# Patient Record
Sex: Female | Born: 1970 | Race: Black or African American | Hispanic: No | Marital: Married | Smoking: Never smoker
Health system: Southern US, Community
[De-identification: ages and names within clinical notes are randomized; demographics above are authoritative.]

## PROBLEM LIST (undated history)

## (undated) DIAGNOSIS — R51 Headache: Secondary | ICD-10-CM

## (undated) DIAGNOSIS — Z8711 Personal history of peptic ulcer disease: Secondary | ICD-10-CM

## (undated) DIAGNOSIS — H409 Unspecified glaucoma: Secondary | ICD-10-CM

## (undated) DIAGNOSIS — E282 Polycystic ovarian syndrome: Secondary | ICD-10-CM

## (undated) DIAGNOSIS — M1990 Unspecified osteoarthritis, unspecified site: Secondary | ICD-10-CM

## (undated) DIAGNOSIS — R7303 Prediabetes: Secondary | ICD-10-CM

## (undated) DIAGNOSIS — K5900 Constipation, unspecified: Secondary | ICD-10-CM

## (undated) DIAGNOSIS — R55 Syncope and collapse: Secondary | ICD-10-CM

## (undated) DIAGNOSIS — E611 Iron deficiency: Secondary | ICD-10-CM

## (undated) DIAGNOSIS — D649 Anemia, unspecified: Secondary | ICD-10-CM

## (undated) DIAGNOSIS — K219 Gastro-esophageal reflux disease without esophagitis: Secondary | ICD-10-CM

## (undated) DIAGNOSIS — I10 Essential (primary) hypertension: Secondary | ICD-10-CM

## (undated) DIAGNOSIS — G4700 Insomnia, unspecified: Secondary | ICD-10-CM

## (undated) DIAGNOSIS — R42 Dizziness and giddiness: Secondary | ICD-10-CM

## (undated) DIAGNOSIS — R519 Headache, unspecified: Secondary | ICD-10-CM

## (undated) DIAGNOSIS — K922 Gastrointestinal hemorrhage, unspecified: Secondary | ICD-10-CM

## (undated) DIAGNOSIS — E039 Hypothyroidism, unspecified: Secondary | ICD-10-CM

## (undated) DIAGNOSIS — E785 Hyperlipidemia, unspecified: Secondary | ICD-10-CM

## (undated) DIAGNOSIS — Z8719 Personal history of other diseases of the digestive system: Secondary | ICD-10-CM

## (undated) DIAGNOSIS — M19041 Primary osteoarthritis, right hand: Secondary | ICD-10-CM

## (undated) DIAGNOSIS — E559 Vitamin D deficiency, unspecified: Secondary | ICD-10-CM

## (undated) DIAGNOSIS — M19042 Primary osteoarthritis, left hand: Secondary | ICD-10-CM

## (undated) DIAGNOSIS — N926 Irregular menstruation, unspecified: Secondary | ICD-10-CM

## (undated) HISTORY — DX: Essential (primary) hypertension: I10

## (undated) HISTORY — DX: Syncope and collapse: R55

## (undated) HISTORY — DX: Iron deficiency: E61.1

## (undated) HISTORY — DX: Personal history of peptic ulcer disease: Z87.11

## (undated) HISTORY — DX: Primary osteoarthritis, right hand: M19.042

## (undated) HISTORY — DX: Dizziness and giddiness: R42

## (undated) HISTORY — DX: Irregular menstruation, unspecified: N92.6

## (undated) HISTORY — DX: Primary osteoarthritis, right hand: M19.041

## (undated) HISTORY — DX: Gastrointestinal hemorrhage, unspecified: K92.2

## (undated) HISTORY — DX: Insomnia, unspecified: G47.00

## (undated) HISTORY — DX: Polycystic ovarian syndrome: E28.2

## (undated) HISTORY — DX: Unspecified glaucoma: H40.9

## (undated) HISTORY — DX: Vitamin D deficiency, unspecified: E55.9

## (undated) HISTORY — DX: Hypothyroidism, unspecified: E03.9

## (undated) HISTORY — DX: Hyperlipidemia, unspecified: E78.5

## (undated) HISTORY — DX: Constipation, unspecified: K59.00

---
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
## 2005-10-10 ENCOUNTER — Inpatient Hospital Stay (HOSPITAL_COMMUNITY): Admission: AD | Admit: 2005-10-10 | Discharge: 2005-10-17 | Payer: Self-pay | Admitting: Obstetrics and Gynecology

## 2005-10-11 ENCOUNTER — Ambulatory Visit: Payer: Self-pay | Admitting: Neonatology

## 2005-10-15 ENCOUNTER — Encounter (INDEPENDENT_AMBULATORY_CARE_PROVIDER_SITE_OTHER): Payer: Self-pay | Admitting: Specialist

## 2005-10-18 ENCOUNTER — Encounter: Admission: RE | Admit: 2005-10-18 | Discharge: 2005-11-16 | Payer: Self-pay | Admitting: Obstetrics and Gynecology

## 2005-11-04 ENCOUNTER — Inpatient Hospital Stay (HOSPITAL_COMMUNITY): Admission: AD | Admit: 2005-11-04 | Discharge: 2005-11-04 | Payer: Self-pay | Admitting: Obstetrics and Gynecology

## 2005-11-17 ENCOUNTER — Encounter: Admission: RE | Admit: 2005-11-17 | Discharge: 2005-12-17 | Payer: Self-pay | Admitting: Obstetrics and Gynecology

## 2005-12-18 ENCOUNTER — Encounter: Admission: RE | Admit: 2005-12-18 | Discharge: 2006-01-08 | Payer: Self-pay | Admitting: Obstetrics and Gynecology

## 2006-05-09 ENCOUNTER — Other Ambulatory Visit: Admission: RE | Admit: 2006-05-09 | Discharge: 2006-05-09 | Payer: Self-pay | Admitting: Obstetrics and Gynecology

## 2008-11-25 HISTORY — PX: LAPAROSCOPIC GASTRIC BANDING: SHX1100

## 2009-02-10 ENCOUNTER — Encounter: Admission: RE | Admit: 2009-02-10 | Discharge: 2009-02-10 | Payer: Self-pay | Admitting: Internal Medicine

## 2010-12-16 ENCOUNTER — Emergency Department (HOSPITAL_COMMUNITY)
Admission: EM | Admit: 2010-12-16 | Discharge: 2010-12-16 | Payer: Self-pay | Source: Home / Self Care | Admitting: Emergency Medicine

## 2011-04-12 NOTE — Discharge Summary (Signed)
Susan [name], KURKA             ACCOUNT NO.:  [id number]   MEDICAL RECORD NO.:  [id number]          PATIENT TYPE:  INP   LOCATION:  9128                          [facility]ILITY:  [facility]   PHYSICIAN:  Hal Morales, M.D.DATE OF BIRTH:  10-Jul-1971   DATE OF ADMISSION:  10/10/2005  DATE OF DISCHARGE:  10/17/2005                                 DISCHARGE SUMMARY   ADMISSION DIAGNOSES:  1.  Intrauterine pregnancy at 33 weeks.  2.  Mild preeclampsia.   DISCHARGE DIAGNOSES:  1.  Intrauterine pregnancy at 33 weeks.  2.  Mild preeclampsia.  3.  Status post preterm vaginal delivery at 34 weeks with precipitous      delivery of a viable female infant named Noelle, Apgars 9 and 9,      weighing 4 pounds 13 ounces.   HOSPITAL PROCEDURES:  1.  Magnesium sulfate infusion.  2.  Electronic fetal monitoring.  3.  Cervidil induction of labor.  4.  Spontaneous vaginal delivery, precipitous.   HOSPITAL COURSE:  The patient was admitted with mild preeclampsia at 33  weeks.  She was given betamethasone for lung maturity and started on  magnesium sulfate infusion.  She did well.  Blood pressures stabilized and  she remained on the magnesium and got her second dose of betamethasone.  She  did develop some intermittent variable decelerations, which recovered during  the antepartum stay.  On 10/14/05, an attempt was made for amniocentesis for  fetal lung maturity, but this was unable to be completed because of cord in  the fluid pocket, so the patient was offered observation versus induction of  labor and the patient chose to be induced.  The cervix was unfavorable, so  Cervidil was placed.  She proceeded to labor on the Cervidil.  After it was  removed, she then labored over a two hour period and delivered precipitously  a female infant named Noelle, Apgars 9 and 9, weighing 4 pounds 13 ounces.  The infant cried spontaneously and was taken to the nursery.  There were no  lacerations or complications.   On postoperative day #1, the patient was  doing well.  Blood pressures were 123-143/75-92.  Routine postpartum care  was given.  She began to diurese and had a couple of blood pressures with  systolics in the 90s, but generally continued to improve.  On postoperative  day #2, blood pressures were 1245-141/74-88.  Her weight was 199.4, which  was down from 211. Chest was clear.  Heart rate regular rate and rhythm.  There was trace edema in the extremities.  Fundus was firm and lochia small.  The patient was deemed to receive the full benefit of her hospital stay and  was discharged home with the baby, who was doing well and gaining weight.   DISCHARGE MEDICATIONS:  Motrin 600 mg p.o. q.6h. p.r.n.   DISCHARGE LABORATORY:  White blood cell count 11.2, hemoglobin 12.0,  platelets 327.   DISCHARGE INSTRUCTIONS:  Per [facility] hand out.   DISCHARGE FOLLOWUP:  In six weeks or p.r.n. with observation for symptoms of  PIH.  Marie L. Williams, C.N.M.      Hal Morales, M.D.  Electronically Signed    MLW/MEDQ  D:  10/17/2005  T:  10/17/2005  Job:  [id number]

## 2011-04-12 NOTE — H&P (Signed)
Susan, [name]             ACCOUNT NO.:  [id number]   MEDICAL RECORD NO.:  [id number]          PATIENT TYPE:  INP   LOCATION:  9176                          [facility]   PHYSICIAN:  Naima A. Dillard, M.D. DATE OF BIRTH:  17-Aug-1971   DATE OF ADMISSION:  10/10/2005  DATE OF DISCHARGE:                                HISTORY & PHYSICAL   HISTORY OF PRESENT ILLNESS:  [name] is a 40 year old gravida 5, para 1-  0-3-1, who is admitted to the birth suites at 33 weeks and 5 days gestation  for magnesium prophylaxis secondary to mild preeclampsia.  The patient has  been being followed as an outpatient with some borderline elevated blood  pressures.  A 24-hour urine obtained and completed on October 10, 2005 with  the result of 338 mg of protein in a 24-hour period of time.  The patient's  blood pressure immediately prior to admission at the office was 120/80.  The  patient denies any headaches, vision changes, right upper quadrant pain.  The patient denies any uterine contractions, leakage of fluid, or bleeding.  The patient has been on bed rest at home; however, she has had limited  compliance secondary to child care responsibilities.  Previous 24-hour urine  prior to the one obtained the day of admission revealed 237 mg of protein in  a 24-hour period of time.  Pregnancy is remarkable for closely-spaced  births, history of PIH and mild preeclampsia previous pregnancy, transfer to  [facility] at 30 weeks, and history of migraines.   PRENATAL LABORATORIES:  Initial hemoglobin and hematocrit of 13.1 and 38.9,  platelets 330,00, blood type O positive, antibody screen negative, sickle  cell trait negative, RPR nonreactive, rubella titer immune, hepatitis B  surface antigen negative, HIV negative, gonorrhea and Chlamydia negative,  cystic fibrosis negative.  TSH 1.334.  Quad screen negative.  Glucola 143,  and 28-week hemoglobin of 12.1.  The patient with 3-hour Glucola result that  is unavailable at the present time; however, the patient reports the results  as normal, as the patient had them done by a previous provide in [place],  [place].   HISTORY OF PRESENT PREGNANCY:  The patient entered care with [facility] at  approximately [redacted] weeks gestation.  The patient had been obtaining regular  prenatal care since the first trimester of her pregnancy.  The patient with  mildly elevated blood pressure noted at 31 weeks, as well as 32 weeks.  The  patient began collecting the first 24-hour urine, which was returned with a  result of 237 mg.  The patient denies any toxic symptoms throughout.  The  patient was seen on October 10, 2005, at which time deep tendon reflexes  were 1+.  The patient denies any toxic symptoms; however, a 24-hour urine is  pending.  After result of the 24-hour urine are made available, the patient  is to be admitted to [facility].  The patient has been  followed by the C.N.M. service at [facility] until admission, at which time she  was transfer to the M.D. care.  The patient's last  menstrual period was  questionable, and EDC of November 24, 2005 is established by a 5-week  ultrasound on Mar 29, 2005.   OBSTETRIC HISTORY:  1.  1996 - TAB.  2.  1997 - TAB.  3.  1999 - Miscarriage, SAB.  4.  January 2006 - spontaneous vaginal delivery of a viable female infant,      weight 5.1 pounds, at [redacted] weeks gestation, epidural anesthesia, in      [place], [place], without complications.  The patient reports      increased blood pressure at approximately 34 weeks prior to admission.  5.  Present pregnancy.   GYNECOLOGIC HISTORY:  The patient with 35-45 day cycles.  However, the  patient had not had a period since she had been breastfeeding.  The patient  with a history of abnormal Pap smear in 1999.  Status post colpo with normal  Paps since that time.  The patient denies any history of STDs.   PAST MEDICAL HISTORY:  1  Migraine headaches.  1.   Hypertension only during pregnancy.  2.  Rare UTIs.   PAST SURGICAL HISTORY:  1.  Liposuction in 2001.  2.  Wisdom teeth in 1991.   FAMILY HISTORY:  Paternal grandfather, maternal grandmother with MIs.  Mother, father, and brother with hypertension.  Mother with diabetes.  Maternal grandfather with diabetes.  Paternal grandmother with diabetes.  Maternal grandmother with renal cancer.  Paternal grandmother with ovarian  cancer.   CURRENT MEDICATIONS:  Prenatal vitamins only.   ALLERGIES:  No known drug allergies.   GENETIC HISTORY:  Daughter with sickle cell trait; however, the patient is  trait negative.  Otherwise, negative genetic screen.   SOCIAL HISTORY:  The patient is a Financial trader.  She and her husband  have recently moved to the area.  The patient denies any domestic violence.  The patient denies any use of alcohol tobacco or street drugs.  The patient  is married to the father of the baby, who is involved and supportive.  The  patient and her husband subscribe to the [facility] faith.   PHYSICAL EXAMINATION:  VITAL SIGNS:  The patient is afebrile.  Vital signs  are stable.  HEENT:  Within normal limits.  LUNGS:  Clear.  HEART:  Regular rate and rhythm without murmur.  BREASTS:  Soft.  ABDOMEN:  Soft, gravid in contour, and nontender.  Fundal height consistent  with [redacted] weeks gestation.  Fetus noted to be vertex by Leopold's maneuvers.  Fetal heart rate in the 130s and reactive.  No contractions noted on uterine  monitoring.  PELVIC:  Deferred at the moment.  EXTREMITIES:  There were 1+ deep tendon reflexes.  No clonus, and trace  edema noted to be present.   LABORATORY WORK:  Admission PIH laboratory work all within normal limits.   ASSESSMENT:  1.  Mild preeclampsia at 33 plus weeks.  2.  History of migraines.   PLAN:  1.  The patient will be admitted to the birthing suites, as no antepartum     beds are available.  This is per consult with Dr. Normand Sloop.   The patient      will be started on magnesium sulfate for seizure prophylaxis.  The      patient to be      given a course of betamethasone for fetal lung maturity.  The patient to      receive a NICU consult.  Further consideration as to delivery after  betamethasone is complete.  The patient was educated and her questions      were answered.      Rhona Leavens, CNM      Naima A. Normand Sloop, M.D.  Electronically Signed    NOS/MEDQ  D:  10/11/2005  T:  10/11/2005  Job:  [id number]

## 2012-02-10 ENCOUNTER — Other Ambulatory Visit: Payer: Self-pay | Admitting: Internal Medicine

## 2012-02-10 DIAGNOSIS — Z1231 Encounter for screening mammogram for malignant neoplasm of breast: Secondary | ICD-10-CM

## 2012-08-07 ENCOUNTER — Ambulatory Visit (INDEPENDENT_AMBULATORY_CARE_PROVIDER_SITE_OTHER): Payer: 59 | Admitting: Surgery

## 2012-08-07 ENCOUNTER — Encounter (INDEPENDENT_AMBULATORY_CARE_PROVIDER_SITE_OTHER): Payer: Self-pay | Admitting: Surgery

## 2012-08-07 VITALS — BP 110/74 | HR 64 | Resp 12 | Ht 62.0 in | Wt 137.4 lb

## 2012-08-07 DIAGNOSIS — Z9884 Bariatric surgery status: Secondary | ICD-10-CM | POA: Insufficient documentation

## 2012-08-07 NOTE — Patient Instructions (Signed)

## 2012-08-07 NOTE — Progress Notes (Signed)
Dejanique Buonomo Body mass index is 25.13 kg/(m^2).  Having regurgitation:  Occasional with eating too fast  Nocturnal reflux?  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 At that time and he had a little bit to her band. Since that time she is kept her weight run around 1:30 to 137. She looks good. In questioning her I think she is appropriately adjusted. I'm afraid adding more fluid will cause her to maladaptive eating. I suggested that she get into an exercise regimen and work out more rather than trying to had a tighter band. I made her appointment to see [place] back in one year to consider her for a fillet that time if needed. Overall she's lost 60 pounds I think looks good and has done well.

## 2013-03-08 ENCOUNTER — Encounter (HOSPITAL_COMMUNITY): Payer: Self-pay | Admitting: Pharmacy Technician

## 2013-03-11 ENCOUNTER — Other Ambulatory Visit: Payer: Self-pay | Admitting: Obstetrics and Gynecology

## 2013-03-15 ENCOUNTER — Other Ambulatory Visit (HOSPITAL_COMMUNITY): Payer: Self-pay | Admitting: Obstetrics and Gynecology

## 2013-03-15 ENCOUNTER — Ambulatory Visit (HOSPITAL_COMMUNITY)
Admission: RE | Admit: 2013-03-15 | Discharge: 2013-03-15 | Disposition: A | Discharge status: Home / Self Care | Payer: PRIVATE HEALTH INSURANCE | Source: Ambulatory Visit | Attending: Obstetrics and Gynecology | Admitting: Obstetrics and Gynecology

## 2013-03-15 ENCOUNTER — Encounter (HOSPITAL_COMMUNITY): Payer: Self-pay

## 2013-03-15 DIAGNOSIS — Z1231 Encounter for screening mammogram for malignant neoplasm of breast: Secondary | ICD-10-CM

## 2013-03-15 DIAGNOSIS — Z01812 Encounter for preprocedural laboratory examination: Secondary | ICD-10-CM | POA: Insufficient documentation

## 2013-03-15 DIAGNOSIS — Z01818 Encounter for other preprocedural examination: Secondary | ICD-10-CM | POA: Insufficient documentation

## 2013-03-15 LAB — SURGICAL PCR SCREEN
MRSA, PCR: NEGATIVE
Staphylococcus aureus: NEGATIVE

## 2013-03-15 LAB — BASIC METABOLIC PANEL
BUN: 10 mg/dL (ref 6–23)
CO2: 31 mEq/L (ref 19–32)
Calcium: 9.4 mg/dL (ref 8.4–10.5)
Chloride: 100 mEq/L (ref 96–112)
GFR calc Af Amer: >90 mL/min (ref >90)
GFR calc non Af Amer: 88 mL/min — ABNORMAL LOW (ref >90)
Glucose, Bld: 91 mg/dL (ref 70–99)
Potassium: 3.8 mEq/L (ref 3.5–5.1)
Sodium: 137 mEq/L (ref 135–145)

## 2013-03-15 LAB — CBC
HCT: 38.4 % (ref 36.0–46.0)
Hemoglobin: 12.7 g/dL (ref 12.0–15.0)
MCHC: 33.1 g/dL (ref 30.0–36.0)
MCV: 93.4 fL (ref 78.0–100.0)
Platelets: 275 10*3/uL (ref 150–400)
RBC: 4.11 MIL/uL (ref 3.87–5.11)
RDW: 12.8 % (ref 11.5–15.5)
WBC: 5.2 10*3/uL (ref 4.0–10.5)

## 2013-03-15 NOTE — Patient Instructions (Addendum)
[address]  03/15/2013   Your procedure is scheduled on:  03/22/13  Enter through the Main Entrance of [facility] at 1230 AM.  Pick up the phone at the desk and dial [phone].   Call this number if you have problems the morning of surgery: [phone]   Remember:   Do not eat food:After Midnight.  Do not drink clear liquids: 6 Hours before arrival.  Take these medicines the morning of surgery with A SIP OF WATER: HCTZ   Do not wear jewelry, make-up or nail polish.  Do not wear lotions, powders, or perfumes. You may wear deodorant.  Do not shave 48 hours prior to surgery.  Do not bring valuables to the hospital.  Contacts, dentures or bridgework may not be worn into surgery.  Leave suitcase in the car. After surgery it may be brought to your room.  For patients admitted to the hospital, checkout time is 11:00 AM the day of discharge.   Patients discharged the day of surgery will not be allowed to drive home.  Name and phone number of your driver: NA  Special Instructions: Shower using CHG 2 nights before surgery and the night before surgery.  If you shower the day of surgery use CHG.  Use special wash - you have one bottle of CHG for all showers.  You should use approximately 1/3 of the bottle for each shower.   Please read over the following fact sheets that you were given: MRSA Information

## 2013-03-17 ENCOUNTER — Encounter (HOSPITAL_COMMUNITY): Payer: Self-pay | Admitting: Pharmacy Technician

## 2013-03-22 ENCOUNTER — Encounter (HOSPITAL_COMMUNITY)
Admission: RE | Disposition: A | Discharge status: Home / Self Care | Payer: Self-pay | Source: Ambulatory Visit | Attending: Obstetrics and Gynecology

## 2013-03-22 ENCOUNTER — Ambulatory Visit (HOSPITAL_COMMUNITY): Payer: PRIVATE HEALTH INSURANCE | Admitting: Anesthesiology

## 2013-03-22 ENCOUNTER — Ambulatory Visit (HOSPITAL_COMMUNITY)
Admission: RE | Admit: 2013-03-22 | Discharge: 2013-03-22 | Disposition: A | Discharge status: Home / Self Care | Payer: PRIVATE HEALTH INSURANCE | Source: Ambulatory Visit | Attending: Obstetrics and Gynecology | Admitting: Obstetrics and Gynecology

## 2013-03-22 ENCOUNTER — Encounter (HOSPITAL_COMMUNITY): Payer: Self-pay | Admitting: Anesthesiology

## 2013-03-22 DIAGNOSIS — Z1231 Encounter for screening mammogram for malignant neoplasm of breast: Secondary | ICD-10-CM | POA: Insufficient documentation

## 2013-03-22 DIAGNOSIS — R1031 Right lower quadrant pain: Secondary | ICD-10-CM | POA: Insufficient documentation

## 2013-03-22 DIAGNOSIS — IMO0002 Reserved for concepts with insufficient information to code with codable children: Secondary | ICD-10-CM | POA: Insufficient documentation

## 2013-03-22 HISTORY — PX: SALPINGECTOMY: SHX328

## 2013-03-22 HISTORY — PX: LAPAROSCOPY: SHX197

## 2013-03-22 HISTORY — PX: BILATERAL SALPINGECTOMY: SHX5743

## 2013-03-22 SURGERY — LAPAROSCOPY OPERATIVE
Anesthesia: General | Wound class: Clean Contaminated

## 2013-03-22 MED ORDER — LIDOCAINE HCL (CARDIAC) 20 MG/ML IV SOLN
INTRAVENOUS | Status: DC | PRN
  Administered 2013-03-22: 80 mg via INTRAVENOUS

## 2013-03-22 MED ORDER — FENTANYL CITRATE 0.05 MG/ML IJ SOLN
INTRAMUSCULAR | Status: AC
  Administered 2013-03-22: 50 ug via INTRAVENOUS
  Filled 2013-03-22: qty 2

## 2013-03-22 MED ORDER — KETOROLAC TROMETHAMINE 30 MG/ML IJ SOLN: 15.0000 mg | Freq: Once | INTRAMUSCULAR | Status: DC | PRN

## 2013-03-22 MED ORDER — GLYCOPYRROLATE 0.2 MG/ML IJ SOLN
INTRAMUSCULAR | Status: AC
  Filled 2013-03-22: qty 3

## 2013-03-22 MED ORDER — ONDANSETRON HCL 4 MG/2ML IJ SOLN
INTRAMUSCULAR | Status: DC | PRN
  Administered 2013-03-22: 4 mg via INTRAVENOUS

## 2013-03-22 MED ORDER — OXYCODONE-ACETAMINOPHEN 5-325 MG PO TABS: 1.0000 | ORAL_TABLET | ORAL | Status: DC | PRN

## 2013-03-22 MED ORDER — MIDAZOLAM HCL 5 MG/5ML IJ SOLN
INTRAMUSCULAR | Status: DC | PRN
  Administered 2013-03-22: 2 mg via INTRAVENOUS

## 2013-03-22 MED ORDER — LACTATED RINGERS IV SOLN
INTRAVENOUS | Status: DC
  Administered 2013-03-22 (×2): via INTRAVENOUS

## 2013-03-22 MED ORDER — DEXAMETHASONE SODIUM PHOSPHATE 10 MG/ML IJ SOLN
INTRAMUSCULAR | Status: AC
  Filled 2013-03-22: qty 1

## 2013-03-22 MED ORDER — MIDAZOLAM HCL 2 MG/2ML IJ SOLN
INTRAMUSCULAR | Status: AC
  Filled 2013-03-22: qty 2

## 2013-03-22 MED ORDER — FENTANYL CITRATE 0.05 MG/ML IJ SOLN
INTRAMUSCULAR | Status: AC
  Filled 2013-03-22: qty 5

## 2013-03-22 MED ORDER — DEXAMETHASONE SODIUM PHOSPHATE 4 MG/ML IJ SOLN
INTRAMUSCULAR | Status: DC | PRN
  Administered 2013-03-22: 10 mg via INTRAVENOUS

## 2013-03-22 MED ORDER — KETOROLAC TROMETHAMINE 30 MG/ML IJ SOLN
INTRAMUSCULAR | Status: AC
  Filled 2013-03-22: qty 1

## 2013-03-22 MED ORDER — ROCURONIUM BROMIDE 50 MG/5ML IV SOLN
INTRAVENOUS | Status: AC
  Filled 2013-03-22: qty 1

## 2013-03-22 MED ORDER — PROPOFOL 10 MG/ML IV BOLUS
INTRAVENOUS | Status: DC | PRN
  Administered 2013-03-22: 160 mg via INTRAVENOUS

## 2013-03-22 MED ORDER — FENTANYL CITRATE 0.05 MG/ML IJ SOLN
INTRAMUSCULAR | Status: DC | PRN
  Administered 2013-03-22: 50 ug via INTRAVENOUS
  Administered 2013-03-22: 100 ug via INTRAVENOUS

## 2013-03-22 MED ORDER — BUPIVACAINE HCL (PF) 0.25 % IJ SOLN
INTRAMUSCULAR | Status: AC
  Filled 2013-03-22: qty 30

## 2013-03-22 MED ORDER — BUPIVACAINE HCL (PF) 0.25 % IJ SOLN
INTRAMUSCULAR | Status: DC | PRN
  Administered 2013-03-22: 6 mL

## 2013-03-22 MED ORDER — GLYCOPYRROLATE 0.2 MG/ML IJ SOLN
INTRAMUSCULAR | Status: AC
  Filled 2013-03-22: qty 1

## 2013-03-22 MED ORDER — KETOROLAC TROMETHAMINE 30 MG/ML IJ SOLN
INTRAMUSCULAR | Status: DC | PRN
  Administered 2013-03-22: 30 mg via INTRAVENOUS

## 2013-03-22 MED ORDER — ONDANSETRON HCL 4 MG/2ML IJ SOLN
INTRAMUSCULAR | Status: AC
  Filled 2013-03-22: qty 2

## 2013-03-22 MED ORDER — NEOSTIGMINE METHYLSULFATE 1 MG/ML IJ SOLN
INTRAMUSCULAR | Status: AC
  Filled 2013-03-22: qty 1

## 2013-03-22 MED ORDER — ACETAMINOPHEN 10 MG/ML IV SOLN
INTRAVENOUS | Status: DC | PRN
  Administered 2013-03-22: 1000 mg via INTRAVENOUS

## 2013-03-22 MED ORDER — ROCURONIUM BROMIDE 100 MG/10ML IV SOLN
INTRAVENOUS | Status: DC | PRN
  Administered 2013-03-22: 50 mg via INTRAVENOUS

## 2013-03-22 MED ORDER — LACTATED RINGERS IR SOLN
Status: DC | PRN
  Administered 2013-03-22: 3000 mL

## 2013-03-22 MED ORDER — LIDOCAINE HCL (CARDIAC) 20 MG/ML IV SOLN
INTRAVENOUS | Status: AC
  Filled 2013-03-22: qty 5

## 2013-03-22 MED ORDER — NEOSTIGMINE METHYLSULFATE 1 MG/ML IJ SOLN
INTRAMUSCULAR | Status: DC | PRN
  Administered 2013-03-22: 4 mg via INTRAVENOUS

## 2013-03-22 MED ORDER — GLYCOPYRROLATE 0.2 MG/ML IJ SOLN
INTRAMUSCULAR | Status: DC | PRN
  Administered 2013-03-22: .6 mg via INTRAVENOUS

## 2013-03-22 MED ORDER — PROPOFOL 10 MG/ML IV EMUL
INTRAVENOUS | Status: AC
  Filled 2013-03-22: qty 20

## 2013-03-22 MED ORDER — ACETAMINOPHEN 160 MG/5ML PO SOLN
ORAL | Status: AC
  Filled 2013-03-22: qty 20.3

## 2013-03-22 MED ORDER — FENTANYL CITRATE 0.05 MG/ML IJ SOLN: 25.0000 ug | INTRAMUSCULAR | Status: DC | PRN

## 2013-03-22 SURGICAL SUPPLY — 72 items
ADH SKN CLS APL DERMABOND .7 (GAUZE/BANDAGES/DRESSINGS) ×2
APPLICATOR COTTON TIP 6IN STRL (MISCELLANEOUS) ×3 IMPLANT
BAG SPEC RTRVL LRG 6X4 10 (ENDOMECHANICALS)
BARRIER ADHS 3X4 INTERCEED (GAUZE/BANDAGES/DRESSINGS) ×3 IMPLANT
BRR ADH 4X3 ABS CNTRL BYND (GAUZE/BANDAGES/DRESSINGS) ×2
CABLE HIGH FREQUENCY MONO STRZ (ELECTRODE) IMPLANT
CATH ROBINSON RED A/P 16FR (CATHETERS) ×3 IMPLANT
CHLORAPREP W/TINT 26ML (MISCELLANEOUS) ×3 IMPLANT
CLOTH BEACON ORANGE TIMEOUT ST (SAFETY) ×3 IMPLANT
CONT PATH 16OZ SNAP LID 3702 (MISCELLANEOUS) ×3 IMPLANT
COVER MAYO STAND STRL (DRAPES) ×3 IMPLANT
COVER TABLE BACK 60X90 (DRAPES) ×6 IMPLANT
COVER TIP SHEARS 8 DVNC (MISCELLANEOUS) ×2 IMPLANT
COVER TIP SHEARS 8MM DA VINCI (MISCELLANEOUS) ×1
DECANTER SPIKE VIAL GLASS SM (MISCELLANEOUS) ×3 IMPLANT
DERMABOND ADVANCED (GAUZE/BANDAGES/DRESSINGS) ×1
DERMABOND ADVANCED .7 DNX12 (GAUZE/BANDAGES/DRESSINGS) ×2 IMPLANT
DRAPE HUG U DISPOSABLE (DRAPE) ×3 IMPLANT
DRAPE LG THREE QUARTER DISP (DRAPES) ×6 IMPLANT
DRAPE WARM FLUID 44X44 (DRAPE) ×3 IMPLANT
ELECT LIGASURE LONG (ELECTRODE) IMPLANT
ELECT REM PT RETURN 9FT ADLT (ELECTROSURGICAL) ×3
ELECTRODE REM PT RTRN 9FT ADLT (ELECTROSURGICAL) ×2 IMPLANT
EVACUATOR SMOKE 8.L (FILTER) ×3 IMPLANT
FORCEPS CUTTING 33CM 5MM (CUTTING FORCEPS) ×1 IMPLANT
FORCEPS CUTTING 45CM 5MM (CUTTING FORCEPS) IMPLANT
GAUZE VASELINE 3X9 (GAUZE/BANDAGES/DRESSINGS) IMPLANT
GLOVE BIO SURGEON STRL SZ 6.5 (GLOVE) ×3 IMPLANT
GLOVE BIOGEL PI IND STRL 7.0 (GLOVE) ×4 IMPLANT
GLOVE BIOGEL PI INDICATOR 7.0 (GLOVE) ×2
GOWN PREVENTION PLUS LG XLONG (DISPOSABLE) ×9 IMPLANT
GOWN STRL REIN XL XLG (GOWN DISPOSABLE) ×18 IMPLANT
KIT ACCESSORY DA VINCI DISP (KITS) ×1
KIT ACCESSORY DVNC DISP (KITS) ×2 IMPLANT
LEGGING LITHOTOMY PAIR STRL (DRAPES) ×3 IMPLANT
NDL INSUFFLATION 14GA 150MM (NEEDLE) IMPLANT
NEEDLE INSUFFLATION 120MM (ENDOMECHANICALS) ×3 IMPLANT
NEEDLE INSUFFLATION 14GA 150MM (NEEDLE) ×3 IMPLANT
NS IRRIG 1000ML POUR BTL (IV SOLUTION) ×9 IMPLANT
OCCLUDER COLPOPNEUMO (BALLOONS) IMPLANT
PACK LAPAROSCOPY BASIN (CUSTOM PROCEDURE TRAY) ×3 IMPLANT
PACK LAVH (CUSTOM PROCEDURE TRAY) ×2 IMPLANT
PAD PREP 24X48 CUFFED NSTRL (MISCELLANEOUS) ×6 IMPLANT
POUCH SPECIMEN RETRIEVAL 10MM (ENDOMECHANICALS) IMPLANT
PROTECTOR NERVE ULNAR (MISCELLANEOUS) ×6 IMPLANT
SCISSORS LAP 5X35 DISP (ENDOMECHANICALS) IMPLANT
SET CYSTO W/LG BORE CLAMP LF (SET/KITS/TRAYS/PACK) IMPLANT
SET IRRIG TUBING LAPAROSCOPIC (IRRIGATION / IRRIGATOR) ×3 IMPLANT
SOLUTION ELECTROLUBE (MISCELLANEOUS) ×3 IMPLANT
SUT VIC AB 0 CT1 27 (SUTURE) ×15
SUT VIC AB 0 CT1 27XBRD ANTBC (SUTURE) ×10 IMPLANT
SUT VICRYL 0 UR6 27IN ABS (SUTURE) ×3 IMPLANT
SUT VICRYL 4-0 PS2 18IN ABS (SUTURE) ×6 IMPLANT
SYR 50ML LL SCALE MARK (SYRINGE) ×3 IMPLANT
SYSTEM CONVERTIBLE TROCAR (TROCAR) IMPLANT
TIP UTERINE 5.1X6CM LAV DISP (MISCELLANEOUS) IMPLANT
TIP UTERINE 6.7X10CM GRN DISP (MISCELLANEOUS) IMPLANT
TIP UTERINE 6.7X6CM WHT DISP (MISCELLANEOUS) IMPLANT
TIP UTERINE 6.7X8CM BLUE DISP (MISCELLANEOUS) IMPLANT
TOWEL OR 17X24 6PK STRL BLUE (TOWEL DISPOSABLE) ×9 IMPLANT
TRAY FOLEY BAG SILVER LF 14FR (CATHETERS) ×3 IMPLANT
TROCAR 12M 150ML BLUNT (TROCAR) IMPLANT
TROCAR BALLN 12MMX100 BLUNT (TROCAR) IMPLANT
TROCAR DISP BLADELESS 8 DVNC (TROCAR) ×2 IMPLANT
TROCAR DISP BLADELESS 8MM (TROCAR) ×1
TROCAR OPTI TIP 12M 100M (ENDOMECHANICALS) ×3 IMPLANT
TROCAR OPTI TIP 5M 100M (ENDOMECHANICALS) ×6 IMPLANT
TROCAR XCEL DIL TIP R 11M (ENDOMECHANICALS) ×3 IMPLANT
TROCAR Z-THREAD 12X150 (TROCAR) ×3 IMPLANT
TUBING FILTER THERMOFLATOR (ELECTROSURGICAL) ×3 IMPLANT
WARMER LAPAROSCOPE (MISCELLANEOUS) ×3 IMPLANT
WATER STERILE IRR 1000ML POUR (IV SOLUTION) ×3 IMPLANT

## 2013-03-22 NOTE — Transfer of Care (Signed)
Immediate Anesthesia Transfer of Care Note  Patient: Susan [name]  Procedure(s) Performed: Procedure(s): LAPAROSCOPY OPERATIVE (N/A) BILATERAL SALPINGECTOMY (Bilateral)  Patient Location: PACU  Anesthesia Type:General  Level of Consciousness: awake, alert , oriented and patient cooperative  Airway & Oxygen Therapy: Patient Spontanous Breathing and Patient connected to nasal cannula oxygen  Post-op Assessment: Report given to PACU RN, Post -op Vital signs reviewed and stable and Patient moving all extremities X 4  Post vital signs: Reviewed and stable  Complications: No apparent anesthesia complications

## 2013-03-22 NOTE — Brief Op Note (Signed)
03/22/2013  3:21 PM  PATIENT:  Susan [name]  42 y.o. female  PRE-OPERATIVE DIAGNOSIS:  Right Lower Quadrant Pain; Dyspareunia   POST-OPERATIVE DIAGNOSIS:  Right Lower Quadrant Pain; Dyspareunia   PROCEDURE:  Procedure(s): LAPAROSCOPY OPERATIVE (N/A) BILATERAL SALPINGECTOMY (Bilateral)  SURGEON:  Surgeon(s) and Role:    * Aivan Fillingim Cathie Beams, MD - Primary  PHYSICIAN ASSISTANT:   ASSISTANTS: Marlinda Mike, CNM   ANESTHESIA:   general  Findings: Rv uterus, IUD in situ, nl ovaries, nl tubes, nl liver edge, GB. Appendix w/ periappendiceal adhesions. No evidence of endometriosis  EBL:  Total I/O In: 1100 [I.V.:1100] Out: -   BLOOD ADMINISTERED:none  DRAINS: none   LOCAL MEDICATIONS USED:  MARCAINE     SPECIMEN:  Source of Specimen:  tubes( right and left)  DISPOSITION OF SPECIMEN:  PATHOLOGY  COUNTS:  YES  TOURNIQUET:  * No tourniquets in log *  DICTATION: .Other Dictation: Dictation Number [id number]  PLAN OF CARE: Discharge to home after PACU   PATIENT DISPOSITION:  PACU - hemodynamically stable.   Delay start of Pharmacological VTE agent (>24hrs) due to surgical blood loss or risk of bleeding: no

## 2013-03-22 NOTE — Anesthesia Preprocedure Evaluation (Signed)
Anesthesia Evaluation  Patient identified by MRN, date of birth, ID band Patient awake    Reviewed: Allergy & Precautions, H&P , NPO status , Patient's Chart, lab work & pertinent test results, reviewed documented beta blocker date and time   History of Anesthesia Complications Negative for: history of anesthetic complications  Airway Mallampati: I TM Distance: >3 FB Neck ROM: full    Dental  (+) Teeth Intact   Pulmonary neg pulmonary ROS,  breath sounds clear to auscultation  Pulmonary exam normal       Cardiovascular Exercise Tolerance: Good hypertension (119/82 today), On Medications Rhythm:regular Rate:Normal     Neuro/Psych negative neurological ROS  negative psych ROS   GI/Hepatic Neg liver ROS, Bowel prep,  Endo/Other  negative endocrine ROS  Renal/GU negative Renal ROS  Female GU complaint     Musculoskeletal   Abdominal   Peds  Hematology negative hematology ROS (+)   Anesthesia Other Findings   Reproductive/Obstetrics negative OB ROS                           Anesthesia Physical Anesthesia Plan  ASA: II  Anesthesia Plan: General ETT   Post-op Pain Management:    Induction:   Airway Management Planned:   Additional Equipment:   Intra-op Plan:   Post-operative Plan:   Informed Consent: I have reviewed the patients History and Physical, chart, labs and discussed the procedure including the risks, benefits and alternatives for the proposed anesthesia with the patient or authorized representative who has indicated his/her understanding and acceptance.   Dental Advisory Given  Plan Discussed with: CRNA and Surgeon  Anesthesia Plan Comments:         Anesthesia Quick Evaluation

## 2013-03-22 NOTE — Anesthesia Procedure Notes (Addendum)
Procedure Name: Intubation Date/Time: 03/22/2013 1:55 PM Performed by: Maris Berger T Pre-anesthesia Checklist: Patient identified, Emergency Drugs available, Suction available and Patient being monitored Patient Re-evaluated:Patient Re-evaluated prior to inductionOxygen Delivery Method: Circle System Utilized Preoxygenation: Pre-oxygenation with 100% oxygen Intubation Type: IV induction Ventilation: Mask ventilation without difficulty Laryngoscope Size: Mac and 3 Grade View: Grade I Tube type: Oral Number of attempts: 1 Airway Equipment and Method: stylet and oral airway Placement Confirmation: ETT inserted through vocal cords under direct vision,  positive ETCO2 and breath sounds checked- equal and bilateral Secured at: 20 cm Tube secured with: Tape Dental Injury: Teeth and Oropharynx as per pre-operative assessment  Comments: Intubated E Tincher SRNA

## 2013-03-22 NOTE — Anesthesia Postprocedure Evaluation (Signed)
  Anesthesia Post-op Note  Anesthesia Post Note  Patient: Associate Professor  Procedure(s) Performed: Procedure(s) (LRB): LAPAROSCOPY OPERATIVE (N/A) BILATERAL SALPINGECTOMY (Bilateral)  Anesthesia type: General  Patient location: PACU  Post pain: Pain level controlled  Post assessment: Post-op Vital signs reviewed  Last Vitals:  Filed Vitals:   03/22/13 1600  BP: 122/70  Pulse: 56  Temp:   Resp: 12    Post vital signs: Reviewed  Level of consciousness: sedated  Complications: No apparent anesthesia complications

## 2013-03-23 ENCOUNTER — Encounter (HOSPITAL_COMMUNITY): Payer: Self-pay | Admitting: Obstetrics and Gynecology

## 2013-03-23 NOTE — Op Note (Signed)
NAMECORTEZ, STEELMAN             ACCOUNT NO.:  [id number]  MEDICAL RECORD NO.:  [id number]  LOCATION:  MAMO                          [facility]ILITY:  [facility]  PHYSICIAN:  Maxie Better, M.D.DATE OF BIRTH:  Apr 06, 1971  DATE OF PROCEDURE:  03/22/2013 DATE OF DISCHARGE:  03/22/2013                              OPERATIVE REPORT   PREOPERATIVE DIAGNOSIS:  Right lower quadrant pain.  PROCEDURE:  Operative laparoscopy bilateral salpingectomy.  POSTOPERATIVE DIAGNOSIS:  Right lower quadrant pain.  ANESTHESIA:  General.  SURGEON:  Maxie Better, M.D.  ASSISTANT:  Marlinda Mike, C.N.M.  PROCEDURE IN DETAIL:  Under adequate general anesthesia, the patient was placed in the dorsal lithotomy position.  She was positioned for robotic surgery.  The patient was sterilely prepped and draped in usual fashion. The bladder was catheterized.  Indwelling Foley catheter was placed. Bivalve speculum was placed in the vagina.  IUD was noted.  Bleeding was noted from the vagina.  Single-tooth tenaculum was placed on the anterior lip of the cervix.  An Acorn cannula was introduced into the cervical os and attached to tenaculum for manipulation of the uterus. The bivalve speculum was removed.  Attention was then turned to the abdomen.  A 0.25% Marcaine was injected in to the infraumbilical. Infraumbilical incision was then made.  Veress needle was introduced, tested with normal saline.  A 3L of CO2 was insufflated.  The Veress needle was then removed.  A 12-mm disposable trocar was introduced into the abdomen without incident.  The robotic camera was then inserted.  The patient was placed in deep Trendelenburg position.  Upper abdomen was noted for normal liver edge.  Gallbladder normal.  The pelvis was noted to have no obvious lesions.  Uterus was retroverted. Both ovaries could not be seen initially.  A 8-mm robotic port site was placed in the left lower quadrant under direct visualization,  The  probe was then used to inspect the pelvis. No evidence of endometriosis in the anterior cul-de-sac or posterior cul- de-sac.  The fallopian tubes both were normal.  Ovaries were both normal.  The appendix was notable for having some periappendiceal adhesions, but otherwise no other findings.  Decision was made to remove both fallopian tubes as previously discussed with the patient and keep her ovaries.  The right lower quadrant port site was made and another 8 mm robotic port was placed under direct visualization.  The gyrus was utilized to serially clamp, cauterize, and cut the mesosalpinx bilaterally and removal of both fallopian tube was done in that fashion.  The abdomen was then copiously irrigated and suctioned.  The appendix was again looked at carefully.  No other lesions were noted.  Decision was then made to terminate the procedure.  The lower ports were then removed under direct visualization.  The infraumbilical port site was taken out taking care not to bring up any underlying structures.  The infraumbilical site was closed with 0 Vicryl figure-of-eight suture at the fascia and the skin incisions were then closed with 4-0 Vicryl subcuticular stitches.  The instruments in the vagina was removed.  SPECIMENS:  Right and left fallopian tubes sent to Pathology.  ESTIMATED BLOOD LOSS:  Minimal.  URINE OUTPUT:  About 150 mL clear urine.  INTRAOPERATIVE FLUID:  About 2L.  COUNTS: Sponge, instrument counts x2 was correct.  COMPLICATIONS:  None.  The patient tolerated the procedure well, was transferred to recovery in stable condition.     Maxie Better, M.D.     [place]/MEDQ  D:  03/22/2013  T:  03/23/2013  Job:  [id number]

## 2013-05-13 ENCOUNTER — Ambulatory Visit (INDEPENDENT_AMBULATORY_CARE_PROVIDER_SITE_OTHER): Payer: PRIVATE HEALTH INSURANCE | Admitting: Physician Assistant

## 2013-05-13 ENCOUNTER — Encounter (INDEPENDENT_AMBULATORY_CARE_PROVIDER_SITE_OTHER): Payer: Self-pay

## 2013-05-13 VITALS — BP 122/70 | HR 76 | Temp 98.2°F | Resp 15 | Ht 62.0 in | Wt 147.8 lb

## 2013-05-13 DIAGNOSIS — Z4651 Encounter for fitting and adjustment of gastric lap band: Secondary | ICD-10-CM

## 2013-05-13 NOTE — Patient Instructions (Signed)
Take clear liquids tonight. Thin protein shakes are ok to start tomorrow morning. Slowly advance your diet thereafter. Call us if you have persistent vomiting or regurgitation, night cough or reflux symptoms. Return as scheduled or sooner if you notice no changes in hunger/portion sizes.  

## 2013-05-13 NOTE — Progress Notes (Signed)
  HISTORY: Susan [name] is a 42 y.o.female who received an AP-Standard lap-band in April 2010 in [place]. She was last seen by Dr. Daphine Deutscher in September 2013 when a fill was deferred as she was in the green zone. She comes in today with 10 lbs weight gain, along with complaints of increasing hunger and portion sizes. She denies regurgitation or reflux symptoms unless she 'tests the waters' with foods that she knows she can't tolerate. She wants a fill today to stem the tide of weight gain.  VITAL SIGNS: Filed Vitals:   05/13/13 0952  BP: 122/70  Pulse: 76  Temp: 98.2 F (36.8 C)  Resp: 15    PHYSICAL EXAM: Physical exam reveals a very well-appearing 42 y.o.female in no apparent distress Neurologic: Awake, alert, oriented Psych: Bright affect, conversant Respiratory: Breathing even and unlabored. No stridor or wheezing Abdomen: Soft, nontender, nondistended to palpation. Incisions well-healed. No incisional hernias. Port easily palpated. Extremities: Atraumatic, good range of motion.  ASSESMENT: 42 y.o.  female  s/p AP-Standard lap-band.   PLAN: The patient's port was accessed with a 20G Huber needle without difficulty. Clear fluid was aspirated and 0.25 mL saline was added to the port. The patient was able to swallow water without difficulty following the procedure and was instructed to take clear liquids for the next 24-48 hours and advance slowly as tolerated.

## 2013-06-10 ENCOUNTER — Encounter (INDEPENDENT_AMBULATORY_CARE_PROVIDER_SITE_OTHER): Payer: Self-pay

## 2013-06-10 ENCOUNTER — Encounter (INDEPENDENT_AMBULATORY_CARE_PROVIDER_SITE_OTHER): Payer: PRIVATE HEALTH INSURANCE

## 2013-06-10 ENCOUNTER — Ambulatory Visit (INDEPENDENT_AMBULATORY_CARE_PROVIDER_SITE_OTHER): Payer: PRIVATE HEALTH INSURANCE | Admitting: Physician Assistant

## 2013-06-10 DIAGNOSIS — Z9884 Bariatric surgery status: Secondary | ICD-10-CM

## 2013-06-10 NOTE — Patient Instructions (Signed)
Return as needed. Focus on good food choices as well as physical activity. Return sooner if you have an increase in hunger, portion sizes or weight. Return also for difficulty swallowing, night cough, reflux.   

## 2013-06-10 NOTE — Progress Notes (Signed)
  HISTORY: Susan [name] is a 42 y.o.female who received an AP-Standard lap-band in April 2010 in [place]. She comes in with 9 lbs weight loss since her last appointment which involved a fill. She denies regurgitation, reflux or night cough. She also describes her hunger being under good control as are her portion sizes.  VITAL SIGNS: Filed Vitals:   06/10/13 1335  BP: 122/86  Pulse: 60  Temp: 97.6 F (36.4 C)  Resp: 14    PHYSICAL EXAM: Physical exam reveals a very well-appearing 42 y.o.female in no apparent distress Neurologic: Awake, alert, oriented Psych: Bright affect, conversant Respiratory: Breathing even and unlabored. No stridor or wheezing Extremities: Atraumatic, good range of motion. Skin: Warm, Dry, no rashes Musculoskeletal: Normal gait, Joints normal  ASSESMENT: 42 y.o.  female  s/p AP-Standard lap-band.   PLAN: As she's clearly in the green zone, we deferred a fill today. I asked her to return for symptoms of obstruction or lack of restriction. She voiced understanding and agreement.

## 2013-11-12 ENCOUNTER — Other Ambulatory Visit: Payer: Self-pay | Admitting: Orthopedic Surgery

## 2013-11-15 ENCOUNTER — Encounter (HOSPITAL_BASED_OUTPATIENT_CLINIC_OR_DEPARTMENT_OTHER): Payer: PRIVATE HEALTH INSURANCE | Admitting: Anesthesiology

## 2013-11-15 ENCOUNTER — Encounter (HOSPITAL_BASED_OUTPATIENT_CLINIC_OR_DEPARTMENT_OTHER): Payer: Self-pay | Admitting: *Deleted

## 2013-11-15 ENCOUNTER — Ambulatory Visit (HOSPITAL_BASED_OUTPATIENT_CLINIC_OR_DEPARTMENT_OTHER)
Admission: RE | Admit: 2013-11-15 | Discharge: 2013-11-15 | Disposition: A | Discharge status: Home / Self Care | Payer: PRIVATE HEALTH INSURANCE | Source: Ambulatory Visit | Attending: Orthopedic Surgery | Admitting: Orthopedic Surgery

## 2013-11-15 ENCOUNTER — Ambulatory Visit (HOSPITAL_BASED_OUTPATIENT_CLINIC_OR_DEPARTMENT_OTHER): Payer: PRIVATE HEALTH INSURANCE | Admitting: Anesthesiology

## 2013-11-15 ENCOUNTER — Encounter (HOSPITAL_BASED_OUTPATIENT_CLINIC_OR_DEPARTMENT_OTHER)
Admission: RE | Disposition: A | Discharge status: Home / Self Care | Payer: Self-pay | Source: Ambulatory Visit | Attending: Orthopedic Surgery

## 2013-11-15 DIAGNOSIS — M6740 Ganglion, unspecified site: Secondary | ICD-10-CM | POA: Insufficient documentation

## 2013-11-15 DIAGNOSIS — G5600 Carpal tunnel syndrome, unspecified upper limb: Secondary | ICD-10-CM | POA: Insufficient documentation

## 2013-11-15 DIAGNOSIS — I10 Essential (primary) hypertension: Secondary | ICD-10-CM | POA: Insufficient documentation

## 2013-11-15 HISTORY — PX: CARPAL TUNNEL RELEASE: SHX101

## 2013-11-15 HISTORY — PX: GANGLION CYST EXCISION: SHX1691

## 2013-11-15 LAB — POCT I-STAT, CHEM 8
Calcium, Ion: 1.31 mmol/L — ABNORMAL HIGH (ref 1.12–1.23)
Glucose, Bld: 90 mg/dL (ref 70–99)
HCT: 45 % (ref 36.0–46.0)
Hemoglobin: 15.3 g/dL — ABNORMAL HIGH (ref 12.0–15.0)
Potassium: 3.3 mEq/L — ABNORMAL LOW (ref 3.5–5.1)

## 2013-11-15 SURGERY — CARPAL TUNNEL RELEASE
Anesthesia: Monitor Anesthesia Care | Site: Wrist | Laterality: Right

## 2013-11-15 MED ORDER — LIDOCAINE HCL (PF) 0.5 % IJ SOLN
INTRAMUSCULAR | Status: DC | PRN
  Administered 2013-11-15: 40 mL via INTRAVENOUS

## 2013-11-15 MED ORDER — HYDROMORPHONE HCL PF 1 MG/ML IJ SOLN
INTRAMUSCULAR | Status: AC
  Filled 2013-11-15: qty 1

## 2013-11-15 MED ORDER — FENTANYL CITRATE 0.05 MG/ML IJ SOLN
INTRAMUSCULAR | Status: AC
  Filled 2013-11-15: qty 4

## 2013-11-15 MED ORDER — FENTANYL CITRATE 0.05 MG/ML IJ SOLN: 50.0000 ug | Freq: Once | INTRAMUSCULAR | Status: DC

## 2013-11-15 MED ORDER — HYDROMORPHONE HCL PF 1 MG/ML IJ SOLN
0.2500 mg | INTRAMUSCULAR | Status: DC | PRN
  Administered 2013-11-15 (×2): 0.5 mg via INTRAVENOUS

## 2013-11-15 MED ORDER — CHLORHEXIDINE GLUCONATE 4 % EX LIQD: 60.0000 mL | Freq: Once | CUTANEOUS | Status: DC

## 2013-11-15 MED ORDER — HYDROCODONE-ACETAMINOPHEN 5-325 MG PO TABS: 1.0000 | ORAL_TABLET | Freq: Four times a day (QID) | ORAL | Status: DC | PRN

## 2013-11-15 MED ORDER — MIDAZOLAM HCL 2 MG/2ML IJ SOLN
INTRAMUSCULAR | Status: AC
  Filled 2013-11-15: qty 2

## 2013-11-15 MED ORDER — PROMETHAZINE HCL 25 MG/ML IJ SOLN: 6.2500 mg | INTRAMUSCULAR | Status: DC | PRN

## 2013-11-15 MED ORDER — BUPIVACAINE HCL (PF) 0.25 % IJ SOLN
INTRAMUSCULAR | Status: DC | PRN
  Administered 2013-11-15: 7 mL

## 2013-11-15 MED ORDER — LIDOCAINE HCL (CARDIAC) 20 MG/ML IV SOLN
INTRAVENOUS | Status: DC | PRN
  Administered 2013-11-15: 50 mg via INTRAVENOUS

## 2013-11-15 MED ORDER — OXYCODONE HCL 5 MG PO TABS: 5.0000 mg | ORAL_TABLET | Freq: Once | ORAL | Status: DC | PRN

## 2013-11-15 MED ORDER — MIDAZOLAM HCL 5 MG/5ML IJ SOLN
INTRAMUSCULAR | Status: DC | PRN
  Administered 2013-11-15 (×2): 1 mg via INTRAVENOUS

## 2013-11-15 MED ORDER — LACTATED RINGERS IV SOLN
INTRAVENOUS | Status: DC
  Administered 2013-11-15: 13:00:00 via INTRAVENOUS

## 2013-11-15 MED ORDER — CEFAZOLIN SODIUM-DEXTROSE 2-3 GM-% IV SOLR
INTRAVENOUS | Status: AC
  Filled 2013-11-15: qty 50

## 2013-11-15 MED ORDER — OXYCODONE HCL 5 MG/5ML PO SOLN: 5.0000 mg | Freq: Once | ORAL | Status: DC | PRN

## 2013-11-15 MED ORDER — ONDANSETRON HCL 4 MG/2ML IJ SOLN
INTRAMUSCULAR | Status: DC | PRN
  Administered 2013-11-15: 4 mg via INTRAVENOUS

## 2013-11-15 MED ORDER — CEFAZOLIN SODIUM-DEXTROSE 2-3 GM-% IV SOLR
2.0000 g | INTRAVENOUS | Status: AC
  Administered 2013-11-15: 2 g via INTRAVENOUS

## 2013-11-15 MED ORDER — BUPIVACAINE HCL (PF) 0.25 % IJ SOLN
INTRAMUSCULAR | Status: AC
  Filled 2013-11-15: qty 30

## 2013-11-15 MED ORDER — PROPOFOL INFUSION 10 MG/ML OPTIME
INTRAVENOUS | Status: DC | PRN
  Administered 2013-11-15: 100 ug/kg/min via INTRAVENOUS

## 2013-11-15 MED ORDER — FENTANYL CITRATE 0.05 MG/ML IJ SOLN
INTRAMUSCULAR | Status: DC | PRN
  Administered 2013-11-15: 25 ug via INTRAVENOUS
  Administered 2013-11-15: 50 ug via INTRAVENOUS
  Administered 2013-11-15: 25 ug via INTRAVENOUS

## 2013-11-15 MED ORDER — MIDAZOLAM HCL 2 MG/2ML IJ SOLN: 1.0000 mg | INTRAMUSCULAR | Status: DC | PRN

## 2013-11-15 SURGICAL SUPPLY — 45 items
BLADE MINI RND TIP GREEN BEAV (BLADE) IMPLANT
BLADE SURG 15 STRL LF DISP TIS (BLADE) ×1 IMPLANT
BLADE SURG 15 STRL SS (BLADE) ×2
BNDG CMPR 9X4 STRL LF SNTH (GAUZE/BANDAGES/DRESSINGS)
BNDG COHESIVE 3X5 TAN STRL LF (GAUZE/BANDAGES/DRESSINGS) ×2 IMPLANT
BNDG ESMARK 4X9 LF (GAUZE/BANDAGES/DRESSINGS) IMPLANT
BNDG GAUZE ELAST 4 BULKY (GAUZE/BANDAGES/DRESSINGS) ×2 IMPLANT
CHLORAPREP W/TINT 26ML (MISCELLANEOUS) ×2 IMPLANT
CORDS BIPOLAR (ELECTRODE) ×2 IMPLANT
COVER MAYO STAND STRL (DRAPES) ×2 IMPLANT
COVER TABLE BACK 60X90 (DRAPES) ×2 IMPLANT
CUFF TOURNIQUET SINGLE 18IN (TOURNIQUET CUFF) ×2 IMPLANT
DECANTER SPIKE VIAL GLASS SM (MISCELLANEOUS) IMPLANT
DRAPE EXTREMITY T 121X128X90 (DRAPE) ×2 IMPLANT
DRAPE SURG 17X23 STRL (DRAPES) ×2 IMPLANT
DRSG KUZMA FLUFF (GAUZE/BANDAGES/DRESSINGS) ×2 IMPLANT
GAUZE XEROFORM 1X8 LF (GAUZE/BANDAGES/DRESSINGS) ×2 IMPLANT
GLOVE BIO SURGEON STRL SZ 6.5 (GLOVE) ×1 IMPLANT
GLOVE BIOGEL PI IND STRL 7.0 (GLOVE) IMPLANT
GLOVE BIOGEL PI IND STRL 8.5 (GLOVE) ×1 IMPLANT
GLOVE BIOGEL PI INDICATOR 7.0 (GLOVE) ×1
GLOVE BIOGEL PI INDICATOR 8.5 (GLOVE) ×1
GLOVE ECLIPSE 7.0 STRL STRAW (GLOVE) ×1 IMPLANT
GLOVE SURG ORTHO 8.0 STRL STRW (GLOVE) ×2 IMPLANT
GOWN BRE IMP PREV XXLGXLNG (GOWN DISPOSABLE) ×2 IMPLANT
GOWN PREVENTION PLUS XLARGE (GOWN DISPOSABLE) ×2 IMPLANT
NEEDLE 27GAX1X1/2 (NEEDLE) ×2 IMPLANT
NS IRRIG 1000ML POUR BTL (IV SOLUTION) ×2 IMPLANT
PACK BASIN DAY SURGERY FS (CUSTOM PROCEDURE TRAY) ×2 IMPLANT
PAD ABD 8X10 STRL (GAUZE/BANDAGES/DRESSINGS) ×1 IMPLANT
PAD CAST 3X4 CTTN HI CHSV (CAST SUPPLIES) ×1 IMPLANT
PADDING CAST ABS 4INX4YD NS (CAST SUPPLIES) ×1
PADDING CAST ABS COTTON 4X4 ST (CAST SUPPLIES) ×1 IMPLANT
PADDING CAST COTTON 3X4 STRL (CAST SUPPLIES) ×2
SPLINT PLASTER CAST XFAST 3X15 (CAST SUPPLIES) ×5 IMPLANT
SPLINT PLASTER XTRA FASTSET 3X (CAST SUPPLIES) ×5
SPONGE GAUZE 4X4 12PLY (GAUZE/BANDAGES/DRESSINGS) ×2 IMPLANT
STOCKINETTE 4X48 STRL (DRAPES) ×2 IMPLANT
SUT VIC AB 4-0 P2 18 (SUTURE) ×1 IMPLANT
SUT VICRYL 4-0 PS2 18IN ABS (SUTURE) IMPLANT
SUT VICRYL RAPIDE 4/0 PS 2 (SUTURE) ×2 IMPLANT
SYR BULB 3OZ (MISCELLANEOUS) ×2 IMPLANT
SYR CONTROL 10ML LL (SYRINGE) ×2 IMPLANT
TOWEL OR 17X24 6PK STRL BLUE (TOWEL DISPOSABLE) ×3 IMPLANT
UNDERPAD 30X30 INCONTINENT (UNDERPADS AND DIAPERS) ×2 IMPLANT

## 2013-11-15 NOTE — Anesthesia Postprocedure Evaluation (Signed)
  Anesthesia Post-op Note  Patient: Associate Professor  Procedure(s) Performed: Procedure(s): RIGHT CARPAL TUNNEL RELEASE (Right) EXCISION CYST RIGHT WRIST (Right)  Patient Location: PACU  Anesthesia Type:MAC and Bier block  Level of Consciousness: awake  Airway and Oxygen Therapy: Patient Spontanous Breathing  Post-op Pain: mild  Post-op Assessment: Post-op Vital signs reviewed, Patient's Cardiovascular Status Stable, Respiratory Function Stable, Patent Airway, No signs of Nausea or vomiting and Pain level controlled  Post-op Vital Signs: Reviewed and stable  Complications: No apparent anesthesia complications

## 2013-11-15 NOTE — Anesthesia Preprocedure Evaluation (Addendum)
Anesthesia Evaluation  Patient identified by MRN, date of birth, ID band Patient awake    Reviewed: Allergy & Precautions, H&P , NPO status , Patient's Chart, lab work & pertinent test results  Airway Mallampati: I TM Distance: >3 FB Neck ROM: Full    Dental   Pulmonary  breath sounds clear to auscultation        Cardiovascular hypertension, Rhythm:Regular Rate:Normal     Neuro/Psych    GI/Hepatic   Endo/Other    Renal/GU      Musculoskeletal   Abdominal   Peds  Hematology   Anesthesia Other Findings   Reproductive/Obstetrics                          Anesthesia Physical Anesthesia Plan  ASA: II  Anesthesia Plan: MAC and Bier Block   Post-op Pain Management:    Induction: Intravenous  Airway Management Planned: Simple Face Mask  Additional Equipment:   Intra-op Plan:   Post-operative Plan:   Informed Consent: I have reviewed the patients History and Physical, chart, labs and discussed the procedure including the risks, benefits and alternatives for the proposed anesthesia with the patient or authorized representative who has indicated his/her understanding and acceptance.     Plan Discussed with: CRNA and Surgeon  Anesthesia Plan Comments:         Anesthesia Quick Evaluation

## 2013-11-15 NOTE — Op Note (Signed)
Dictation Number [id number]

## 2013-11-15 NOTE — H&P (Signed)
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  She has not been seen in two years.  She states that over the past month this has increased with discomfort on the radial aspect of her right wrist.  She has no new injury.  She states that she got away from a lot of dentistry, but has returned to using it more and her symptoms have increased.  She is complaining of occasional numbness and tingling to her fingers. She has no new injury. She complains of constant, moderate, aching, burning type pain with a feeling of weakness.  She has been taking ibuprofen for this with minimal relief.   Her nerve conductions are positive revealing carpal tunnel syndrome on her right side with a motor delay of 4.8, sensory 2.6 on her right side only. Her left side is normal. These were done in October 2014.   ALLERGIES:   None.  MEDICATIONS:  HCTZ.  SURGICAL HISTORY:   Lap band and tubal ligation.  FAMILY MEDICAL HISTORY:  Positive for diabetes, heart disease, high blood pressure and arthritis.  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    REVIEW OF SYSTEMS:   Positive for glasses, high blood pressure and headaches, otherwise negative. Susan [name] is an 42 y.o. female.   Chief Complaint: CTS and volar cyst rt HPI: see above  Past Medical History  Diagnosis Date  . Hyperlipidemia   . Hypertension     Past Surgical History  Procedure Laterality Date  . Laparoscopic gastric banding    . Cosmetic surgery    . Laparoscopy N/A 03/22/2013    Procedure: LAPAROSCOPY OPERATIVE;  Surgeon: Serita Kyle, MD;  Location: [facility];  Service: Gynecology;  Laterality: N/A;  . Bilateral salpingectomy Bilateral 03/22/2013    Procedure: BILATERAL SALPINGECTOMY;  Surgeon: Serita Kyle, MD;  Location: [facility];  Service: Gynecology;  Laterality: Bilateral;  .  Salpingectomy  03/22/2013    Family History  Problem Relation Age of Onset  . Cancer Mother     pancreatic  . Stroke Mother   . Hypertension Father    Social History:  reports that she has never smoked. She does not have any smokeless tobacco history on file. She reports that she drinks alcohol. She reports that she does not use illicit drugs.  Allergies: No Known Allergies  Medications Prior to Admission  Medication Sig Dispense Refill  . hydrochlorothiazide (MICROZIDE) 12.5 MG capsule Take 12.5 mg by mouth daily.      . Aspirin-Salicylamide-Caffeine (BC HEADACHE POWDER PO) Take 1 each by mouth daily as needed (headaches).        Results for orders placed during the hospital encounter of 11/15/13 (from the past 48 hour(s))  POCT I-STAT, CHEM 8     Status: Abnormal   Collection Time    11/15/13  1:11 PM      Result Value Range   Sodium 139  135 - 145 mEq/L   Potassium 3.3 (*) 3.5 - 5.1 mEq/L   Chloride 96  96 - 112 mEq/L   BUN 9  6 - 23 mg/dL   Creatinine, Ser 1.61  0.50 - 1.10 mg/dL   Glucose, Bld 90  70 - 99 mg/dL   Calcium, Ion 0.96 (*) 1.12 - 1.23 mmol/L   TCO2 31  0 - 100 mmol/L   Hemoglobin 15.3 (*) 12.0 - 15.0  g/dL   HCT 16.1  09.6 - 04.5 %    No results found.   Pertinent items are noted in HPI.  Blood pressure 117/82, pulse 71, temperature 98.3 F (36.8 C), temperature source Oral, resp. rate 20, height 5' 1.5" (1.562 m), weight 131 lb (59.421 kg), SpO2 100.00%.  General appearance: alert, cooperative and appears stated age Head: Normocephalic, without obvious abnormality Neck: no JVD Resp: clear to auscultation bilaterally Cardio: regular rate and rhythm, S1, S2 normal, no murmur, click, rub or gallop GI: soft, non-tender; bowel sounds normal; no masses,  no organomegaly Extremities: extremities normal, atraumatic, no cyanosis or edema Pulses: 2+ and symmetric Skin: Skin color, texture, turgor normal. No rashes or lesions Neurologic: Grossly  normal Incision/Wound: na  Assessment/Plan She is scheduled for right carpal tunnel release with excision volar radial wrist ganglion right wrist.  Caydin Yeatts R 11/15/2013, 1:51 PM

## 2013-11-15 NOTE — Brief Op Note (Signed)
11/15/2013  3:27 PM  PATIENT:  Lenon Oms  42 y.o. female  PRE-OPERATIVE DIAGNOSIS:  RIGHT CARPAL TUNNEL SYNDROME/VOLAR CYST RIGHT WRIST  POST-OPERATIVE DIAGNOSIS:  RIGHT CARPAL TUNNEL SYNDROME/VOLAR CYST RIGHT WRIST  PROCEDURE:  Procedure(s): RIGHT CARPAL TUNNEL RELEASE (Right) EXCISION CYST RIGHT WRIST (Right)  SURGEON:  Surgeon(s) and Role:    * Nicki Reaper, MD - Primary  PHYSICIAN ASSISTANT:   ASSISTANTS: none   ANESTHESIA:   local and regional  EBL:     BLOOD ADMINISTERED:none  DRAINS: none   LOCAL MEDICATIONS USED:  BUPIVICAINE   SPECIMEN:  Excision  DISPOSITION OF SPECIMEN:  PATHOLOGY  COUNTS:  YES  TOURNIQUET:   Total Tourniquet Time Documented: Upper Arm (Right) - 32 minutes Total: Upper Arm (Right) - 32 minutes   DICTATION: .Other Dictation: Dictation Number [id number]  PLAN OF CARE: Discharge to home after PACU  PATIENT DISPOSITION:  PACU - hemodynamically stable.

## 2013-11-15 NOTE — Transfer of Care (Signed)
Immediate Anesthesia Transfer of Care Note  Patient: Associate Professor  Procedure(s) Performed: Procedure(s): RIGHT CARPAL TUNNEL RELEASE (Right) EXCISION CYST RIGHT WRIST (Right)  Patient Location: PACU  Anesthesia Type:Bier block  Level of Consciousness: awake, sedated and patient cooperative  Airway & Oxygen Therapy: Patient Spontanous Breathing and Patient connected to face mask oxygen  Post-op Assessment: Report given to PACU RN and Post -op Vital signs reviewed and stable  Post vital signs: Reviewed and stable  Complications: No apparent anesthesia complications

## 2013-11-16 ENCOUNTER — Encounter (HOSPITAL_BASED_OUTPATIENT_CLINIC_OR_DEPARTMENT_OTHER): Payer: Self-pay | Admitting: Orthopedic Surgery

## 2013-11-16 NOTE — Op Note (Signed)
NAMEKARLENA, LUEBKE NO.:  [id number]  MEDICAL RECORD NO.:  [id number]  LOCATION:                                 FACILITY:  PHYSICIAN:  Cindee Salt, M.D.            DATE OF BIRTH:  DATE OF PROCEDURE:  11/15/2013 DATE OF DISCHARGE:                              OPERATIVE REPORT   PREOPERATIVE DIAGNOSIS:  Carpal tunnel syndrome, volar radial wrist ganglion, right wrist.  POSTOPERATIVE DIAGNOSIS:  Carpal tunnel syndrome, volar radial wrist ganglion, right wrist.  OPERATION:  Carpal tunnel release with excision of volar radial wrist ganglion, right wrist.  SURGEON:  Cindee Salt, MD  ANESTHESIA:  Upper arm IV regional with local infiltration.  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  She has elected undergo surgical release of the carpal canal with excision of the volar radial wrist ganglion.  Pre, peri, and postoperative course have been discussed along with risks and complications.  She is aware that there is no guarantee with the surgery, possibility of infection; recurrence of injury to arteries, nerves, tendons, incomplete relief of symptoms, dystrophy.  In preoperative area, the patient was seen, the extremity marked by both patient and surgeon.  Antibiotic given.  PROCEDURE IN DETAIL:  The patient was brought to the operating room, where an upper arm IV regional anesthetic was carried out without difficulty.  She was prepped using ChloraPrep, supine position with the right arm free.  A 3-minute dry time was allowed.  Time-out taken, confirming the patient and procedure.  The carpal tunnel was approached first.  Longitudinal incision made in the mid palm, carried down through subcutaneous tissue.  Bleeders were electrocauterized.  Palmar fascia was split.  Superficial palmar arch  identified.  The flexor tendon to the ring little finger identified to the ulnar side of the median nerve. Carpal retinaculum was incised with sharp dissection.  Right angle and Sewall retractor were placed between skin and forearm fascia.  Fascia was released for approximately 2 cm proximal to the wrist crease under direct vision.  Canal was explored.  Air compression to the nerve was apparent.  A motor branch entered from the central aspect of the median nerve, was noted to enter into muscle.  No further lesions were identified.  The wound was irrigated and the skin closed with interrupted 4-0 Vicryl Rapide sutures.  A separate incision was then made over the volar radial wrist, carried down through subcutaneous tissue.  Bleeders again electrocauterized, dissection, carried down between the flexor carpi radialis tendon, radial artery which was identified, protected.  Bleeders again electrocauterized with bipolar. A multilobulated cyst was immediately encountered with blunt sharp dissection, this was traced down to the radiocarpal joint, excised.  The area was then cauterized, exited from the radiocarpal joint, and that it was too small in area to try the suture.  The wound was copiously irrigated with saline.  The subcutaneous tissue was then closed with interrupted 4-0 Vicryl correction 4-0 Vicryl and  the skin with interrupted 4-0 Vicryl Rapide.  A local infiltration 0.25% Marcaine without epinephrine was given to each of the wounds, total of 67 mL was used.  A sterile compressive dressing was applied.  On deflation of the tourniquet, all fingers immediately pinked.  She was taken to the recovery room for observation in satisfactory condition.  She will be discharged home to return in 1 week on Norco.          ______________________________ Cindee Salt, M.D.     GK/MEDQ  D:  11/15/2013  T:  11/16/2013  Job:  [id number]

## 2014-02-21 ENCOUNTER — Other Ambulatory Visit (HOSPITAL_COMMUNITY): Payer: Self-pay | Admitting: Internal Medicine

## 2014-02-21 DIAGNOSIS — Z1231 Encounter for screening mammogram for malignant neoplasm of breast: Secondary | ICD-10-CM

## 2014-03-23 ENCOUNTER — Ambulatory Visit (HOSPITAL_COMMUNITY)
Admission: RE | Admit: 2014-03-23 | Discharge: 2014-03-23 | Disposition: A | Discharge status: Home / Self Care | Payer: PRIVATE HEALTH INSURANCE | Source: Ambulatory Visit | Attending: Internal Medicine | Admitting: Internal Medicine

## 2014-03-23 ENCOUNTER — Encounter (INDEPENDENT_AMBULATORY_CARE_PROVIDER_SITE_OTHER): Payer: Self-pay

## 2014-03-23 DIAGNOSIS — Z1231 Encounter for screening mammogram for malignant neoplasm of breast: Secondary | ICD-10-CM | POA: Insufficient documentation

## 2014-07-14 ENCOUNTER — Encounter (INDEPENDENT_AMBULATORY_CARE_PROVIDER_SITE_OTHER): Payer: Self-pay

## 2014-07-14 ENCOUNTER — Ambulatory Visit (INDEPENDENT_AMBULATORY_CARE_PROVIDER_SITE_OTHER): Payer: PRIVATE HEALTH INSURANCE | Admitting: Physician Assistant

## 2014-07-14 ENCOUNTER — Ambulatory Visit
Admission: RE | Admit: 2014-07-14 | Discharge: 2014-07-14 | Disposition: A | Discharge status: Home / Self Care | Payer: PRIVATE HEALTH INSURANCE | Source: Ambulatory Visit | Attending: Physician Assistant | Admitting: Physician Assistant

## 2014-07-14 VITALS — BP 118/80 | HR 72 | Temp 98.0°F | Resp 14 | Ht 62.0 in | Wt 137.2 lb

## 2014-07-14 DIAGNOSIS — Z4651 Encounter for fitting and adjustment of gastric lap band: Secondary | ICD-10-CM

## 2014-07-14 NOTE — Progress Notes (Signed)
  HISTORY: Susan [name] is a 43 y.o.female who received an AP-Standard lap-band in April 2010 in [place]. She is here as transfer of care. The patient has lost 1.6 lbs since their last visit in July 2014, and has lost 61 lbs since surgery. She comes in with complaints of near daily solid food dysphagia. She states that she occasionally has trouble with liquids. She wants some fluid out as a lap band vacation.  VITAL SIGNS: Filed Vitals:   07/14/14 1107  BP: 118/80  Pulse: 72  Temp: 98 F (36.7 C)  Resp: 14    PHYSICAL EXAM: Physical exam reveals a very well-appearing 43 y.o.female in no apparent distress Neurologic: Awake, alert, oriented Psych: Bright affect, conversant Respiratory: Breathing even and unlabored. No stridor or wheezing Abdomen: Soft, nontender, nondistended to palpation. Incisions well-healed. No incisional hernias. Port easily palpated. Extremities: Atraumatic, good range of motion.  ASSESMENT: 43 y.o.  female  s/p AP-Standard lap-band.   PLAN: The patient's port was accessed with a 20G Huber needle without difficulty. Clear fluid was aspirated and 4 mL saline was removed from the port to give a total predicted volume of 2 mL. The patient was advised to concentrate on healthy food choices and to avoid slider foods high in fats and carbohydrates. I've ordered a KUB to evaluate band position. We'll see her again at the beginning of October.

## 2014-07-14 NOTE — Patient Instructions (Signed)
Return in one month. Focus on good food choices as well as physical activity. Return sooner if you have an increase in hunger, portion sizes or weight. Return also for difficulty swallowing, night cough, reflux.   

## 2014-07-28 ENCOUNTER — Encounter (INDEPENDENT_AMBULATORY_CARE_PROVIDER_SITE_OTHER): Payer: PRIVATE HEALTH INSURANCE

## 2014-08-08 DIAGNOSIS — Z719 Counseling, unspecified: Secondary | ICD-10-CM | POA: Insufficient documentation

## 2014-08-25 ENCOUNTER — Encounter (INDEPENDENT_AMBULATORY_CARE_PROVIDER_SITE_OTHER): Payer: PRIVATE HEALTH INSURANCE

## 2014-11-25 DIAGNOSIS — D649 Anemia, unspecified: Secondary | ICD-10-CM

## 2014-11-25 HISTORY — DX: Anemia, unspecified: D64.9

## 2015-01-24 HISTORY — PX: COSMETIC SURGERY: SHX468

## 2015-10-03 ENCOUNTER — Inpatient Hospital Stay (HOSPITAL_COMMUNITY)
Admission: EM | Admit: 2015-10-03 | Discharge: 2015-10-05 | DRG: 378 | Disposition: A | Discharge status: Home / Self Care | Payer: PRIVATE HEALTH INSURANCE | Attending: Internal Medicine | Admitting: Internal Medicine

## 2015-10-03 ENCOUNTER — Encounter (HOSPITAL_COMMUNITY): Payer: Self-pay | Admitting: Emergency Medicine

## 2015-10-03 DIAGNOSIS — I10 Essential (primary) hypertension: Secondary | ICD-10-CM | POA: Diagnosis present

## 2015-10-03 DIAGNOSIS — T502X5A Adverse effect of carbonic-anhydrase inhibitors, benzothiadiazides and other diuretics, initial encounter: Secondary | ICD-10-CM | POA: Diagnosis present

## 2015-10-03 DIAGNOSIS — D62 Acute posthemorrhagic anemia: Secondary | ICD-10-CM | POA: Diagnosis present

## 2015-10-03 DIAGNOSIS — Z23 Encounter for immunization: Secondary | ICD-10-CM | POA: Diagnosis not present

## 2015-10-03 DIAGNOSIS — Z7289 Other problems related to lifestyle: Secondary | ICD-10-CM | POA: Diagnosis not present

## 2015-10-03 DIAGNOSIS — Z79899 Other long term (current) drug therapy: Secondary | ICD-10-CM

## 2015-10-03 DIAGNOSIS — K921 Melena: Principal | ICD-10-CM | POA: Diagnosis present

## 2015-10-03 DIAGNOSIS — Z9884 Bariatric surgery status: Secondary | ICD-10-CM | POA: Diagnosis not present

## 2015-10-03 DIAGNOSIS — R739 Hyperglycemia, unspecified: Secondary | ICD-10-CM | POA: Diagnosis present

## 2015-10-03 DIAGNOSIS — K209 Esophagitis, unspecified: Secondary | ICD-10-CM | POA: Diagnosis present

## 2015-10-03 DIAGNOSIS — I959 Hypotension, unspecified: Secondary | ICD-10-CM | POA: Diagnosis present

## 2015-10-03 DIAGNOSIS — K922 Gastrointestinal hemorrhage, unspecified: Secondary | ICD-10-CM | POA: Diagnosis present

## 2015-10-03 DIAGNOSIS — R55 Syncope and collapse: Secondary | ICD-10-CM | POA: Diagnosis present

## 2015-10-03 DIAGNOSIS — E7849 Other hyperlipidemia: Secondary | ICD-10-CM | POA: Diagnosis present

## 2015-10-03 DIAGNOSIS — E785 Hyperlipidemia, unspecified: Secondary | ICD-10-CM | POA: Diagnosis present

## 2015-10-03 DIAGNOSIS — D509 Iron deficiency anemia, unspecified: Secondary | ICD-10-CM | POA: Diagnosis present

## 2015-10-03 DIAGNOSIS — E876 Hypokalemia: Secondary | ICD-10-CM | POA: Diagnosis present

## 2015-10-03 DIAGNOSIS — K259 Gastric ulcer, unspecified as acute or chronic, without hemorrhage or perforation: Secondary | ICD-10-CM | POA: Diagnosis present

## 2015-10-03 LAB — PROTIME-INR
INR: 1.11 (ref 0.00–1.49)
PROTHROMBIN TIME: 14.5 s (ref 11.6–15.2)

## 2015-10-03 LAB — BASIC METABOLIC PANEL
ANION GAP: 10 (ref 5–15)
BUN: 23 mg/dL — ABNORMAL HIGH (ref 6–20)
CHLORIDE: 101 mmol/L (ref 101–111)
CO2: 25 mmol/L (ref 22–32)
Calcium: 8.8 mg/dL — ABNORMAL LOW (ref 8.9–10.3)
Creatinine, Ser: 1.17 mg/dL — ABNORMAL HIGH (ref 0.44–1.00)
GFR calc Af Amer: >60 mL/min (ref >60)
GFR calc non Af Amer: 56 mL/min — ABNORMAL LOW (ref >60)
GLUCOSE: 176 mg/dL — AB (ref 65–99)
POTASSIUM: 3.2 mmol/L — AB (ref 3.5–5.1)
Sodium: 136 mmol/L (ref 135–145)

## 2015-10-03 LAB — URINALYSIS, ROUTINE W REFLEX MICROSCOPIC
Bilirubin Urine: NEGATIVE
Glucose, UA: NEGATIVE mg/dL
Ketones, ur: 15 mg/dL — AB
NITRITE: NEGATIVE
PH: 6 (ref 5.0–8.0)
Protein, ur: 30 mg/dL — AB
SPECIFIC GRAVITY, URINE: 1.025 (ref 1.005–1.030)
UROBILINOGEN UA: 1 mg/dL (ref 0.0–1.0)

## 2015-10-03 LAB — RETICULOCYTES
RBC.: 2.72 MIL/uL — AB (ref 3.87–5.11)
RETIC COUNT ABSOLUTE: 24.5 10*3/uL (ref 19.0–186.0)
RETIC CT PCT: 0.9 % (ref 0.4–3.1)

## 2015-10-03 LAB — CBC
HCT: 25.7 % — ABNORMAL LOW (ref 36.0–46.0)
HEMOGLOBIN: 8.6 g/dL — AB (ref 12.0–15.0)
MCH: 31.6 pg (ref 26.0–34.0)
MCHC: 33.5 g/dL (ref 30.0–36.0)
MCV: 94.5 fL (ref 78.0–100.0)
Platelets: 271 10*3/uL (ref 150–400)
RBC: 2.72 MIL/uL — ABNORMAL LOW (ref 3.87–5.11)
RDW: 12.9 % (ref 11.5–15.5)
WBC: 10.7 10*3/uL — ABNORMAL HIGH (ref 4.0–10.5)

## 2015-10-03 LAB — CBC WITH DIFFERENTIAL/PLATELET
Basophils Absolute: 0.1 10*3/uL (ref 0.0–0.1)
Basophils Relative: 1 %
Eosinophils Absolute: 0.2 10*3/uL (ref 0.0–0.7)
Eosinophils Relative: 2 %
HEMATOCRIT: 30.2 % — AB (ref 36.0–46.0)
HEMOGLOBIN: 9.8 g/dL — AB (ref 12.0–15.0)
LYMPHS ABS: 5.4 10*3/uL — AB (ref 0.7–4.0)
Lymphocytes Relative: 44 %
MCH: 30.4 pg (ref 26.0–34.0)
MCHC: 32.5 g/dL (ref 30.0–36.0)
MCV: 93.8 fL (ref 78.0–100.0)
MONOS PCT: 5 %
Monocytes Absolute: 0.6 10*3/uL (ref 0.1–1.0)
NEUTROS ABS: 6 10*3/uL (ref 1.7–7.7)
NEUTROS PCT: 48 %
Platelets: 352 10*3/uL (ref 150–400)
RBC: 3.22 MIL/uL — AB (ref 3.87–5.11)
RDW: 12.8 % (ref 11.5–15.5)
WBC: 12.2 10*3/uL — AB (ref 4.0–10.5)

## 2015-10-03 LAB — I-STAT TROPONIN, ED: TROPONIN I, POC: 0 ng/mL (ref 0.00–0.08)

## 2015-10-03 LAB — IRON AND TIBC
Iron: 114 ug/dL (ref 28–170)
SATURATION RATIOS: 35 % — AB (ref 10.4–31.8)
TIBC: 329 ug/dL (ref 250–450)
UIBC: 215 ug/dL

## 2015-10-03 LAB — POC OCCULT BLOOD, ED: FECAL OCCULT BLD: POSITIVE — AB

## 2015-10-03 LAB — URINE MICROSCOPIC-ADD ON

## 2015-10-03 LAB — FOLATE: FOLATE: 10.2 ng/mL (ref >5.9)

## 2015-10-03 LAB — VITAMIN B12: VITAMIN B 12: 408 pg/mL (ref 180–914)

## 2015-10-03 LAB — PREGNANCY, URINE: PREG TEST UR: NEGATIVE

## 2015-10-03 LAB — ABO/RH: ABO/RH(D): A POS

## 2015-10-03 LAB — FERRITIN: Ferritin: 15 ng/mL (ref 11–307)

## 2015-10-03 LAB — MRSA PCR SCREENING: MRSA by PCR: NEGATIVE

## 2015-10-03 MED ORDER — SODIUM CHLORIDE 0.9 % IV SOLN
INTRAVENOUS | Status: AC
Start: 2015-10-03 — End: 2015-10-04
  Administered 2015-10-03: 20:00:00 via INTRAVENOUS

## 2015-10-03 MED ORDER — POTASSIUM CHLORIDE CRYS ER 20 MEQ PO TBCR
40.0000 meq | EXTENDED_RELEASE_TABLET | Freq: Once | ORAL | Status: AC
  Administered 2015-10-03: 40 meq via ORAL
  Filled 2015-10-03: qty 2

## 2015-10-03 MED ORDER — ACETAMINOPHEN 325 MG PO TABS
650.0000 mg | ORAL_TABLET | Freq: Four times a day (QID) | ORAL | Status: DC | PRN
  Administered 2015-10-05: 650 mg via ORAL
  Filled 2015-10-03: qty 2

## 2015-10-03 MED ORDER — ADULT MULTIVITAMIN W/MINERALS CH
1.0000 | ORAL_TABLET | Freq: Every day | ORAL | Status: DC
  Administered 2015-10-03 – 2015-10-05 (×3): 1 via ORAL
  Filled 2015-10-03 (×3): qty 1

## 2015-10-03 MED ORDER — SODIUM CHLORIDE 0.9 % IJ SOLN
3.0000 mL | Freq: Two times a day (BID) | INTRAMUSCULAR | Status: DC
  Administered 2015-10-04: 3 mL via INTRAVENOUS

## 2015-10-03 MED ORDER — PANTOPRAZOLE SODIUM 40 MG IV SOLR
40.0000 mg | Freq: Two times a day (BID) | INTRAVENOUS | Status: DC
Start: 2015-10-03 — End: 2015-10-03

## 2015-10-03 MED ORDER — FOLIC ACID 1 MG PO TABS
1.0000 mg | ORAL_TABLET | Freq: Every day | ORAL | Status: DC
  Administered 2015-10-03 – 2015-10-05 (×3): 1 mg via ORAL
  Filled 2015-10-03 (×3): qty 1

## 2015-10-03 MED ORDER — THIAMINE HCL 100 MG/ML IJ SOLN: 100.0000 mg | Freq: Every day | INTRAMUSCULAR | Status: DC

## 2015-10-03 MED ORDER — PANTOPRAZOLE SODIUM 40 MG IV SOLR: 40.0000 mg | Freq: Two times a day (BID) | INTRAVENOUS | Status: DC

## 2015-10-03 MED ORDER — VITAMIN B-1 100 MG PO TABS
100.0000 mg | ORAL_TABLET | Freq: Every day | ORAL | Status: DC
  Administered 2015-10-03 – 2015-10-05 (×3): 100 mg via ORAL
  Filled 2015-10-03 (×3): qty 1

## 2015-10-03 MED ORDER — ONDANSETRON HCL 4 MG/2ML IJ SOLN
4.0000 mg | Freq: Four times a day (QID) | INTRAMUSCULAR | Status: DC | PRN
  Administered 2015-10-04: 4 mg via INTRAVENOUS

## 2015-10-03 MED ORDER — PANTOPRAZOLE SODIUM 40 MG IV SOLR
40.0000 mg | INTRAVENOUS | Status: AC
  Administered 2015-10-03: 40 mg via INTRAVENOUS
  Filled 2015-10-03: qty 40

## 2015-10-03 MED ORDER — FAMOTIDINE IN NACL 20-0.9 MG/50ML-% IV SOLN
20.0000 mg | Freq: Once | INTRAVENOUS | Status: AC
  Administered 2015-10-03: 20 mg via INTRAVENOUS
  Filled 2015-10-03: qty 50

## 2015-10-03 MED ORDER — ONDANSETRON HCL 4 MG PO TABS: 4.0000 mg | ORAL_TABLET | Freq: Four times a day (QID) | ORAL | Status: DC | PRN

## 2015-10-03 MED ORDER — INFLUENZA VAC SPLIT QUAD 0.5 ML IM SUSY
0.5000 mL | PREFILLED_SYRINGE | INTRAMUSCULAR | Status: AC
  Administered 2015-10-05: 0.5 mL via INTRAMUSCULAR
  Filled 2015-10-03: qty 0.5

## 2015-10-03 MED ORDER — SODIUM CHLORIDE 0.9 % IV BOLUS (SEPSIS)
1000.0000 mL | Freq: Once | INTRAVENOUS | Status: AC
  Administered 2015-10-03: 1000 mL via INTRAVENOUS

## 2015-10-03 MED ORDER — ACETAMINOPHEN 650 MG RE SUPP: 650.0000 mg | Freq: Four times a day (QID) | RECTAL | Status: DC | PRN

## 2015-10-03 MED ORDER — LORAZEPAM 1 MG PO TABS: 1.0000 mg | ORAL_TABLET | Freq: Four times a day (QID) | ORAL | Status: DC | PRN

## 2015-10-03 MED ORDER — SODIUM CHLORIDE 0.9 % IV SOLN
INTRAVENOUS | Status: DC
  Administered 2015-10-04: 10:00:00 via INTRAVENOUS

## 2015-10-03 MED ORDER — LORAZEPAM 2 MG/ML IJ SOLN: 1.0000 mg | Freq: Four times a day (QID) | INTRAMUSCULAR | Status: DC | PRN

## 2015-10-03 MED ORDER — SODIUM CHLORIDE 0.9 % IV SOLN
8.0000 mg/h | INTRAVENOUS | Status: DC
  Administered 2015-10-03 – 2015-10-04 (×2): 8 mg/h via INTRAVENOUS
  Filled 2015-10-03 (×8): qty 80

## 2015-10-03 MED ORDER — ALBUTEROL SULFATE (2.5 MG/3ML) 0.083% IN NEBU: 2.5000 mg | INHALATION_SOLUTION | RESPIRATORY_TRACT | Status: DC | PRN

## 2015-10-03 NOTE — ED Provider Notes (Signed)
CSN: [id number]     Arrival date & time 10/03/15  1336 History   First MD Initiated Contact with Patient 10/03/15 1456     Chief Complaint  Patient presents with  . Loss of Consciousness     (Consider location/radiation/quality/duration/timing/severity/associated sxs/prior Treatment) HPI Comments: The pt is a 44 y/o female - hx of lap band surgery 5 years ago who has lost a lot of weight - she had a tummy tuck earlier this year - overall eats very little - she uses BC powders approximately every other week but used to use them routinely. She does not take any antacid medications. She reports that over the last day she has had 2 black and tarry stools. After she used the bathroom at work today she became very lightheaded and had a syncopal episode. She was found to be diaphoretic, pale, clammy and passed out. She did not hit her head, there is no other injuries, the coworkers were with her at the time. She states that she has not had anything to eat today except for a couple of peanuts and some lemonade. This is not unusual for her. At this time the patient is feeling much better. She denies having any menstrual cycle for a long time since she had an IUD placed. She denies seeing any bright red blood. She does note that last week she was nauseated all week and ate very little. The syncopal event was acute in onset, it occurred just prior to arrival. It has since resolved  Patient is a 44 y.o. female presenting with syncope. The history is provided by the patient.  Loss of Consciousness   Past Medical History  Diagnosis Date  . Hyperlipidemia   . Hypertension    Past Surgical History  Procedure Laterality Date  . Laparoscopic gastric banding    . Cosmetic surgery    . Laparoscopy N/A 03/22/2013    Procedure: LAPAROSCOPY OPERATIVE;  Surgeon: Serita Kyle, MD;  Location: [facility];  Service: Gynecology;  Laterality: N/A;  . Bilateral salpingectomy Bilateral 03/22/2013    Procedure:  BILATERAL SALPINGECTOMY;  Surgeon: Serita Kyle, MD;  Location: [facility];  Service: Gynecology;  Laterality: Bilateral;  . Salpingectomy  03/22/2013  . Carpal tunnel release Right 11/15/2013    Procedure: RIGHT CARPAL TUNNEL RELEASE;  Surgeon: Nicki Reaper, MD;  Location: [facility];  Service: Orthopedics;  Laterality: Right;  . Ganglion cyst excision Right 11/15/2013    Procedure: EXCISION CYST RIGHT WRIST;  Surgeon: Nicki Reaper, MD;  Location: [facility];  Service: Orthopedics;  Laterality: Right;   Family History  Problem Relation Age of Onset  . Cancer Mother     pancreatic  . Stroke Mother   . Hypertension Father    Social History  Substance Use Topics  . Smoking status: Never Smoker   . Smokeless tobacco: None  . Alcohol Use: Yes     Comment: socially   OB History    No data available     Review of Systems  Cardiovascular: Positive for syncope.  All other systems reviewed and are negative.     Allergies  Review of patient's allergies indicates no known allergies.  Home Medications   Prior to Admission medications   Medication Sig Start Date End Date Taking? Authorizing Provider  Aspirin-Salicylamide-Caffeine (BC HEADACHE POWDER PO) Take 1 each by mouth daily as needed (headaches).   Yes Historical Provider, MD  hydrochlorothiazide (MICROZIDE) 12.5 MG capsule Take 12.5 mg  by mouth daily.   Yes Historical Provider, MD   BP 105/72 mmHg  Pulse 68  Temp(Src) 97.8 F (36.6 C) (Oral)  Resp 15  SpO2 98% Physical Exam  Constitutional: She appears well-developed and well-nourished. No distress.  HENT:  Head: Normocephalic and atraumatic.  Mouth/Throat: Oropharynx is clear and moist. No oropharyngeal exudate.  Eyes: Conjunctivae and EOM are normal. Pupils are equal, round, and reactive to light. Right eye exhibits no discharge. Left eye exhibits no discharge. No scleral icterus.  Pale conjunctiva  Neck: Normal range of motion.  Neck supple. No JVD present. No thyromegaly present.  Cardiovascular: Normal rate, regular rhythm, normal heart sounds and intact distal pulses.  Exam reveals no gallop and no friction rub.   No murmur heard. Pulmonary/Chest: Effort normal and breath sounds normal. No respiratory distress. She has no wheezes. She has no rales.  Abdominal: Soft. Bowel sounds are normal. She exhibits no distension and no mass. There is tenderness (minimal suprapubic discomfort, lap band palpable in the epigastrium).  Musculoskeletal: Normal range of motion. She exhibits no edema or tenderness.  Lymphadenopathy:    She has no cervical adenopathy.  Neurological: She is alert. Coordination normal.  Skin: Skin is warm and dry. No rash noted. No erythema.  Psychiatric: She has a normal mood and affect. Her behavior is normal.  Nursing note and vitals reviewed.   ED Course  Procedures (including critical care time) Labs Review Labs Reviewed  BASIC METABOLIC PANEL - Abnormal; Notable for the following:    Potassium 3.2 (*)    Glucose, Bld 176 (*)    BUN 23 (*)    Creatinine, Ser 1.17 (*)    Calcium 8.8 (*)    GFR calc non Af Amer 56 (*)    All other components within normal limits  URINALYSIS, ROUTINE W REFLEX MICROSCOPIC (NOT AT [facility]) - Abnormal; Notable for the following:    APPearance CLOUDY (*)    Hgb urine dipstick TRACE (*)    Ketones, ur 15 (*)    Protein, ur 30 (*)    Leukocytes, UA MODERATE (*)    All other components within normal limits  CBC WITH DIFFERENTIAL/PLATELET - Abnormal; Notable for the following:    WBC 12.2 (*)    RBC 3.22 (*)    Hemoglobin 9.8 (*)    HCT 30.2 (*)    Lymphs Abs 5.4 (*)    All other components within normal limits  POC OCCULT BLOOD, ED - Abnormal; Notable for the following:    Fecal Occult Bld POSITIVE (*)    All other components within normal limits  PREGNANCY, URINE  PROTIME-INR  URINE MICROSCOPIC-ADD ON  CBG MONITORING, ED  I-STAT TROPOININ, ED  TYPE AND  SCREEN  ABO/RH    Imaging Review No results found. I have personally reviewed and evaluated these images and lab results as part of my medical decision-making.   EKG Interpretation   Date/Time:  Tuesday October 03 2015 14:16:15 EST Ventricular Rate:  78 PR Interval:  175 QRS Duration: 99 QT Interval:  406 QTC Calculation: 462 R Axis:   33 Text Interpretation:  Sinus rhythm Low voltage, precordial leads  Borderline T abnormalities, anterior leads No old tracing to compare  Confirmed by Luka Reisch  MD, Brystal Kildow ([id number]) on 10/03/2015 5:01:37 PM      MDM   Final diagnoses:  Upper GI bleed    The patient will need to have a rectal exam as I suspect that up her GI bleeding as  the cause of her new anemia based on labs. Vital signs are gradually improving, blood pressure is now in the 90s though it was initially in the 60s on arrival. the patient is very ill from an upper GI bleed, hypotension, syncope, concern for active upper GI bleeding. She has not vomited any coffee ground emesis. Will obtain IV access, type and screen   Orthostatics, admission, pepcid, protonix.  D/w GI - they will consult  D/w hospitalist - they will admit  BP looks better after fluid rescucitation.  Medications  sodium chloride 0.9 % bolus 1,000 mL (0 mLs Intravenous Stopped 10/03/15 1523)  famotidine (PEPCID) IVPB 20 mg premix (0 mg Intravenous Stopped 10/03/15 1553)  pantoprazole (PROTONIX) injection 40 mg (40 mg Intravenous Given 10/03/15 1523)  sodium chloride 0.9 % bolus 1,000 mL (0 mLs Intravenous Stopped 10/03/15 1656)     Eber HongBrian Camaron Cammack, MD 10/03/15 1704

## 2015-10-03 NOTE — ED Notes (Signed)
Patient states went to the bathroom at work to have a bowel movement and got real clammy, then passed out.  Patient denies hitting head or having any injuries.    Patient denies having any other symptoms other than feeling "clammy".

## 2015-10-03 NOTE — Progress Notes (Signed)
Patient admitted to 2C04 via stretcher from ED. Patient ambulated to bed without difficulty. CHG bath given. Patient denies any pain or discomfort at this time. Gastroenterology in room seeing patient at this time.

## 2015-10-03 NOTE — H&P (Addendum)
History and Physical  Susan [name] ZOX:[id number] DOB: 01-08-1971 DOA: 10/03/2015  Referring physician: Dr. Erasmo Downer, EDP PCP: [name], MD  Outpatient Specialists:  1. Bariatric Surgery: Dr. Luretha Murphy  Chief Complaint: Passing black stools and passed out.  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 Patient states that ever since her lap banding, she eats very little and not healthy. She has intermittent nausea and vomiting. A week ago she had 5 days of nausea and nonbloody emesis and was barely able to keep down any food or drinks. At that time she was constipated and did not have a BM. This eventually resolved and for the next 3 or 4 days she was able to eat and drink. She uses when necessary BC powder up to 3-4 times a month and the last time was approximately 1-2 weeks ago. She went to work this morning without complaints. At about lunchtime, she used the bathroom and noticed black tarry stools. When she stood up to wash her hands, she experienced dizziness, lightheadedness, clamminess and went and sat down in her chair. She requested her staff to bring a blood pressure cuff to check her blood pressure. She then wanted to use the restroom again and her staff was assisting her when she passed out before reaching the bathroom. Her staff held her and hence she did not sustain a fall or injuries. Her syncope may have lasted as a few seconds. She was then laid down in Trendelenburg position. She felt better. She presented to the ED where her systolics were in the 60s. She was bolused with 2 L of IV normal saline and the blood pressures improved to the 90s. She is feeling much better without any dizziness, lightheadedness. She did not have chest pain, dyspnea or palpitations. She  had another episode of black tarry stools in the ED. She has irregular and minimal menstrual bleeding due to IUD. Rectal exam by EDP showed melanotic stools which were FOBT positive. GI has been consulted and plan for EGD tomorrow. Hospitalist admission requested.   Review of Systems: All systems reviewed and apart from history of presenting illness, are negative.  Past Medical History  Diagnosis Date  . Hyperlipidemia   . Hypertension    Past Surgical History  Procedure Laterality Date  . Laparoscopic gastric banding    . Cosmetic surgery    . Laparoscopy N/A 03/22/2013    Procedure: LAPAROSCOPY OPERATIVE;  Surgeon: Serita Kyle, MD;  Location: [facility];  Service: Gynecology;  Laterality: N/A;  . Bilateral salpingectomy Bilateral 03/22/2013    Procedure: BILATERAL SALPINGECTOMY;  Surgeon: Serita Kyle, MD;  Location: [facility];  Service: Gynecology;  Laterality: Bilateral;  . Salpingectomy  03/22/2013  . Carpal tunnel release Right 11/15/2013    Procedure: RIGHT CARPAL TUNNEL RELEASE;  Surgeon: Nicki Reaper, MD;  Location: [facility];  Service: Orthopedics;  Laterality: Right;  . Ganglion cyst excision Right 11/15/2013    Procedure: EXCISION CYST RIGHT WRIST;  Surgeon: Nicki Reaper, MD;  Location: [facility];  Service: Orthopedics;  Laterality: Right;   Social History:  reports that she has never smoked. She does not have any smokeless tobacco history on file. She reports that she drinks alcohol. She reports that she  does not use illicit drugs. Married. Lives with spouse. Independent of activities of daily living. States that she drinks 2 glasses of wine up to 4 days a week. No tobacco or drug abuse.  No Known Allergies  Family History  Problem Relation Age of Onset  . Cancer Mother     pancreatic  . Stroke Mother   . Diabetes Mother   . Hypertension Father     Prior to Admission medications   Medication Sig Start Date End Date Taking?  Authorizing Provider  Aspirin-Salicylamide-Caffeine (BC HEADACHE POWDER PO) Take 1 each by mouth daily as needed (headaches).   Yes Historical Provider, MD  hydrochlorothiazide (MICROZIDE) 12.5 MG capsule Take 12.5 mg by mouth daily.   Yes Historical Provider, MD   Physical Exam: Filed Vitals:   10/03/15 1600 10/03/15 1615 10/03/15 1630 10/03/15 1645  BP: 106/77 106/72 105/71 105/72  Pulse:  73 76 68  Temp:      TempSrc:      Resp: 20 24 11 15   SpO2:  100% 100% 98%     General exam: Moderately built and nourished pleasant young female patient, lying comfortably supine on the gurney in no obvious distress.  Head, eyes and ENT: Nontraumatic and normocephalic. Pupils equally reacting to light and accommodation. Oral mucosa moist.  Neck: Supple. No JVD, carotid bruit or thyromegaly.  Lymphatics: No lymphadenopathy.  Respiratory system: Clear to auscultation. No increased work of breathing.  Cardiovascular system: S1 and S2 heard, RRR. No JVD, murmurs, gallops, clicks or pedal edema.  Gastrointestinal system: Abdomen is nondistended, soft and nontender. Lower anterior abdominal wall crease surgical scar from tummy tuck-healed. Port of lap banding felt in the epigastric region. Skin tattoos on anterior abdominal wall. Normal bowel sounds heard. No organomegaly or masses appreciated.  Central nervous system: Alert and oriented. No focal neurological deficits.  Extremities: Symmetric 5 x 5 power. Peripheral pulses symmetrically felt.   Skin: No rashes or acute findings.  Musculoskeletal system: Negative exam.  Psychiatry: Pleasant and cooperative.   Labs on Admission:  Basic Metabolic Panel:  Recent Labs Lab 10/03/15 1410  NA 136  K 3.2*  CL 101  CO2 25  GLUCOSE 176*  BUN 23*  CREATININE 1.17*  CALCIUM 8.8*   Liver Function Tests: No results for input(s): AST, ALT, ALKPHOS, BILITOT, PROT, ALBUMIN in the last 168 hours. No results for input(s): LIPASE, AMYLASE in the  last 168 hours. No results for input(s): AMMONIA in the last 168 hours. CBC:  Recent Labs Lab 10/03/15 1410  WBC 12.2*  NEUTROABS 6.0  HGB 9.8*  HCT 30.2*  MCV 93.8  PLT 352   Cardiac Enzymes: No results for input(s): CKTOTAL, CKMB, CKMBINDEX, TROPONINI in the last 168 hours.  BNP (last 3 results) No results for input(s): PROBNP in the last 8760 hours. CBG: No results for input(s): GLUCAP in the last 168 hours.  Radiological Exams on Admission: No results found.  EKG: Independently reviewed. Sinus rhythm, normal axis and no acute changes. QTC 462 ms.  Assessment/Plan Principal Problem:   Upper GI bleed Active Problems:   Syncope   Acute blood loss anemia   Hypotension   Hyperlipidemia   Hypertension   Acute upper GI bleed   1. Acute upper GI bleed: DD: NSAID-induced gastritis, ulcer, esophagitis/PUD/complication of lap banding versus other etiologies. Admit to stepdown unit for close monitoring. [facility] has consulted. Clear liquids for now. Nothing by mouth after midnight for possible EGD 11/9. Continue IV Protonix 40 mg  twice a day. Patient has been counseled to abstain from [facility] powder or other NSAID-like substances. She verbalizes understanding. 2. Acute blood loss anemia: Secondary to upper GI bleed. Baseline hemoglobin may be in the 12 g range (no labs since 2014). Admitted with hemoglobin of 9.8 which may drop further. Follow CBCs every 8 hourly and consider transfusing if hemoglobin is less than 8 g per DL. Will check anemia panel to rule out nutritional deficiencies related to lap banding and poor oral intake. 3. Syncope: Again related to acute blood loss anemia. IV fluids. When necessary blood transfusions. Monitor on telemetry in stepdown. 4. Hypotension: Secondary to acute blood loss anemia. Patient has not taken her HCTZ today-continue to hold. Improved after IV fluids. Close monitoring. 5. Hypokalemia: Replace and follow. Likely secondary to HCTZ  use. 6. Essential hypertension: Presented with hypotension. Holding antihypertensives. 7. Hyperlipidemia: Not on medications at home. 8. Alcohol Use: will place on CIWA protocol. Discussed with patient and she agrees.    DVT prophylaxis: SCDs Code Status: Full Family Communication: Discussed in detail with spouse at bedside. Updated care and answered questions.  Disposition Plan: DC home when medically stable.   Time spent: 60 minutes.  Marcellus Scott, MD, FACP, FHM. [facility] Pager [phone]  If 7PM-7AM, please contact night-coverage [email or web address] Password [facility] 10/03/2015, 5:42 PM

## 2015-10-03 NOTE — Consult Note (Signed)
[facility] Consult: 4:44 PM 10/03/2015     Referring Provider: dr Hyacinth Meeker  Primary Care Physician:  [name], MD Primary Gastroenterologist:  unassigned    Reason for Consultation:  Gi bleed   HPI: Susan [name] is a 44 y.o. female.  [name] History Obesity.  S/p lap band in 2010, at [facility]. Dr. Wenda Low follows her lap band..  S/p lap bil salpingectomy 2014. Status post 12/2014 abdominoplasty at [facility] S/p 10/2013 right carpal tunnel release.   Melenic stools twice this afternoon.  Syncope today at work, hypotensive in ED.  Hgb 9.  coags normal Uses bc powders every few weeks, no bleeding issues before.  No previous upper endoscopy or colonoscopy. Frequent nausea and nonbloody, non-coffee ground emesis. She vomits usually at least once a day. She has never brought this up with her physicians.    Past Medical History  Diagnosis Date  . Hyperlipidemia   . Hypertension     Past Surgical History  Procedure Laterality Date  . Laparoscopic gastric banding    . Cosmetic surgery    . Laparoscopy N/A 03/22/2013    Procedure: LAPAROSCOPY OPERATIVE;  Surgeon: Serita Kyle, MD;  Location: [facility];  Service: Gynecology;  Laterality: N/A;  . Bilateral salpingectomy Bilateral 03/22/2013    Procedure: BILATERAL SALPINGECTOMY;  Surgeon: Serita Kyle, MD;  Location: [facility];  Service: Gynecology;  Laterality: Bilateral;  . Salpingectomy  03/22/2013  . Carpal tunnel release Right 11/15/2013    Procedure: RIGHT CARPAL TUNNEL RELEASE;  Surgeon: Nicki Reaper, MD;  Location: [facility];  Service: Orthopedics;  Laterality: Right;  . Ganglion cyst excision Right 11/15/2013    Procedure: EXCISION CYST RIGHT WRIST;  Surgeon: Nicki Reaper, MD;  Location: [facility];  Service: Orthopedics;  Laterality: Right;    Prior to Admission medications   Medication Sig Start Date End Date Taking? Authorizing Provider  Aspirin-Salicylamide-Caffeine (BC HEADACHE POWDER PO) Take 1 each by mouth daily as needed (headaches).   Yes Historical Provider, MD  hydrochlorothiazide (MICROZIDE) 12.5 MG capsule Take 12.5 mg by mouth daily.   Yes Historical Provider, MD    Scheduled Meds:  Infusions:  PRN Meds:    Allergies as of 10/03/2015  . (No Known Allergies)    Family History  Problem Relation Age of Onset  . Cancer Mother     pancreatic  . Stroke Mother   . Hypertension Father     Social History   Social History  . Marital Status: Married    Spouse Name: N/A  . Number of Children: N/A  . Years of Education: N/A   Occupational History  . Not on file.   Social History Main Topics  . Smoking status: Never Smoker   . Smokeless tobacco: Not on file  . Alcohol Use: Yes     Comment: socially  . Drug Use: No  . Sexual Activity: Not on file   Other Topics Concern  . Not on file   Social History Narrative  REVIEW OF SYSTEMS: Constitutional:  Overall she lost about 70 pounds following the lap band and she has gained about 15 of these back so total weight loss now equaling about 55 pounds ENT:  No nose bleeds Pulm:  No shortness of breath, no cough. CV:  No palpitations, no LE edema.  GU:  No hematuria, no frequency GI:  Occasional solid food dysphagia. No nausea/vomiting as above. Heme:  No issues with low blood counts or excessive bleeding or bruising.   Transfusions:  None ever Neuro:  No headaches, no peripheral tingling or numbness Derm:  No itching, no rash or sores.  Endocrine:  No sweats or chills.  No polyuria or dysuria Immunization:  Did not inquire as to previous immunizations Travel:  None beyond local counties in last few months.    PHYSICAL EXAM: Vital signs in last 24 hours: Filed Vitals:   10/03/15 1534  BP:  92/70  Pulse: 79  Temp:   Resp: 16   Wt Readings from Last 3 Encounters:  07/14/14 137 lb 3.2 oz (62.234 kg)  11/15/13 131 lb (59.421 kg)  06/10/13 138 lb 12.8 oz (62.959 kg)   General: Pleasant, looks well. Well-nourished. Head:  No swelling or facial asymmetry  Eyes:  No scleral icterus, no conjunctival pallor Ears:  Not hard of hearing.  Nose:  No discharge, no congestion Mouth:  Moist mucous membranes, good dentition. Neck:  No mass, no TMG, no JVD. Lungs:  Clear bilaterally. No labored breathing. No cough Heart: RRR. No MRG. S1/S2 audible. Abdomen:  Soft. NT, ND.  No HSM, hernias or masses.   Rectal: Deferred   Musc/Skeltl: No joint swelling, redness or contracture deformity Extremities:  No CCE  Neurologic:  Intelligent. Alert. Oriented 3. No tremors. No limb weakness. Skin:  No rash, no sores. No telangiectasia Tattoos:  On the abdomen and back. Nodes:  No cervical or inguinal adenopathy   Psych:  Pleasant, cooperative, calm.  Intake/Output from previous day:   Intake/Output this shift:    LAB RESULTS:  Recent Labs  10/03/15 1410  WBC 12.2*  HGB 9.8*  HCT 30.2*  PLT 352   BMET Lab Results  Component Value Date   NA 136 10/03/2015   NA 139 11/15/2013   NA 137 03/15/2013   K 3.2* 10/03/2015   K 3.3* 11/15/2013   K 3.8 03/15/2013   CL 101 10/03/2015   CL 96 11/15/2013   CL 100 03/15/2013   CO2 25 10/03/2015   CO2 31 03/15/2013   GLUCOSE 176* 10/03/2015   GLUCOSE 90 11/15/2013   GLUCOSE 91 03/15/2013   BUN 23* 10/03/2015   BUN 9 11/15/2013   BUN 10 03/15/2013   CREATININE 1.17* 10/03/2015   CREATININE 1.00 11/15/2013   CREATININE 0.82 03/15/2013   CALCIUM 8.8* 10/03/2015   CALCIUM 9.4 03/15/2013   LFT No results for input(s): PROT, ALBUMIN, AST, ALT, ALKPHOS, BILITOT, BILIDIR, IBILI in the last 72 hours. PT/INR Lab Results  Component Value Date   INR 1.11 10/03/2015   Hepatitis Panel No results for input(s): HEPBSAG, HCVAB, HEPAIGM,  HEPBIGM in the last 72 hours. C-Diff No components found for: CDIFF Lipase  No results found for: LIPASE  Drugs of Abuse  No results found for: LABOPIA, COCAINSCRNUR, LABBENZ, AMPHETMU, THCU, LABBARB   RADIOLOGY STUDIES: No results found.  ENDOSCOPIC STUDIES: None ever  IMPRESSION:   *  ugi bleed. Rule out ulcer, rule out lap band complication.  *  Anemia, likely due to acute blood  loss. MCV normal.  *  Hypokalemia.  *  Renal insufficiency,   *  Hyperglycemia   PLAN:     *  Iv bid protonix.  egd tomorrow, discussed with patient and she is agreeable..  CBC every 8 hours. Okay to have clears tonight.   Jennye MoccasinSarah Gribbin  10/03/2015, 4:44 PM Pager: [phone]     Attending Addendum I have taken an interval history, reviewed the chart, and examined the patient. I agree with the Advanced Practitioner's note and impression. Patient with onset of vomiting and now with stool concerning for melena which started today. She reports a history of chronic vomiting, as well as recent NSAID use (Goody powder). Hgb down from baseline to 9s. Will monitor serial Hgb, place on IV PPI, and plan for endoscopy in the morning to further evaluate and endoscopically treat if needed pending what we find. The indications, risks, and benefits of EGD were explained to the patient in detail. Risks include but are not limited to bleeding, perforation, adverse reaction to medications, and cardiopulmonary compromise. The patient verbalized understanding and wished to proceed. All questions answered. Further recommendations pending results of the exam. If she has any worsening of symptoms overnight or becomes unstable please contact us. Otherwise will make her NPO at this time.   Ileene PatrickSteven Armbruster, MD [facility] Pager [phone]

## 2015-10-04 ENCOUNTER — Encounter (HOSPITAL_COMMUNITY)
Admission: EM | Disposition: A | Discharge status: Home / Self Care | Payer: Self-pay | Source: Home / Self Care | Attending: Internal Medicine

## 2015-10-04 ENCOUNTER — Encounter (HOSPITAL_COMMUNITY): Payer: Self-pay | Admitting: *Deleted

## 2015-10-04 ENCOUNTER — Inpatient Hospital Stay (HOSPITAL_COMMUNITY): Payer: PRIVATE HEALTH INSURANCE | Admitting: Anesthesiology

## 2015-10-04 HISTORY — PX: ESOPHAGOGASTRODUODENOSCOPY: SHX5428

## 2015-10-04 LAB — BASIC METABOLIC PANEL
Anion gap: 7 (ref 5–15)
BUN: 16 mg/dL (ref 6–20)
CHLORIDE: 108 mmol/L (ref 101–111)
CO2: 24 mmol/L (ref 22–32)
CREATININE: 0.77 mg/dL (ref 0.44–1.00)
Calcium: 8.4 mg/dL — ABNORMAL LOW (ref 8.9–10.3)
GFR calc non Af Amer: >60 mL/min (ref >60)
Glucose, Bld: 92 mg/dL (ref 65–99)
Potassium: 4 mmol/L (ref 3.5–5.1)
Sodium: 139 mmol/L (ref 135–145)

## 2015-10-04 LAB — CBC
HCT: 22.5 % — ABNORMAL LOW (ref 36.0–46.0)
HEMATOCRIT: 24.5 % — AB (ref 36.0–46.0)
Hemoglobin: 7.4 g/dL — ABNORMAL LOW (ref 12.0–15.0)
Hemoglobin: 8.1 g/dL — ABNORMAL LOW (ref 12.0–15.0)
MCH: 30.8 pg (ref 26.0–34.0)
MCH: 31.3 pg (ref 26.0–34.0)
MCHC: 32.9 g/dL (ref 30.0–36.0)
MCHC: 33.1 g/dL (ref 30.0–36.0)
MCV: 93.8 fL (ref 78.0–100.0)
MCV: 94.6 fL (ref 78.0–100.0)
PLATELETS: 255 10*3/uL (ref 150–400)
PLATELETS: 283 10*3/uL (ref 150–400)
RBC: 2.4 MIL/uL — ABNORMAL LOW (ref 3.87–5.11)
RBC: 2.59 MIL/uL — AB (ref 3.87–5.11)
RDW: 12.9 % (ref 11.5–15.5)
RDW: 13.1 % (ref 11.5–15.5)
WBC: 10 10*3/uL (ref 4.0–10.5)
WBC: 8.8 10*3/uL (ref 4.0–10.5)

## 2015-10-04 LAB — HEMOGLOBIN AND HEMATOCRIT, BLOOD
HCT: 26 % — ABNORMAL LOW (ref 36.0–46.0)
HCT: 31.3 % — ABNORMAL LOW (ref 36.0–46.0)
HEMOGLOBIN: 8.7 g/dL — AB (ref 12.0–15.0)
Hemoglobin: 10.5 g/dL — ABNORMAL LOW (ref 12.0–15.0)

## 2015-10-04 LAB — PREPARE RBC (CROSSMATCH)

## 2015-10-04 SURGERY — EGD (ESOPHAGOGASTRODUODENOSCOPY)
Anesthesia: Monitor Anesthesia Care

## 2015-10-04 MED ORDER — FERUMOXYTOL INJECTION 510 MG/17 ML
510.0000 mg | Freq: Once | INTRAVENOUS | Status: AC
  Administered 2015-10-04: 510 mg via INTRAVENOUS
  Filled 2015-10-04: qty 17

## 2015-10-04 MED ORDER — SODIUM CHLORIDE 0.9 % IV SOLN
INTRAVENOUS | Status: DC
  Administered 2015-10-04: 75 mL/h via INTRAVENOUS

## 2015-10-04 MED ORDER — LIDOCAINE HCL (CARDIAC) 20 MG/ML IV SOLN
INTRAVENOUS | Status: DC | PRN
  Administered 2015-10-04: 50 mg via INTRAVENOUS

## 2015-10-04 MED ORDER — PROPOFOL 10 MG/ML IV BOLUS
INTRAVENOUS | Status: DC | PRN
  Administered 2015-10-04: 20 mg via INTRAVENOUS
  Administered 2015-10-04: 30 mg via INTRAVENOUS

## 2015-10-04 MED ORDER — MIDAZOLAM HCL 5 MG/5ML IJ SOLN
INTRAMUSCULAR | Status: DC | PRN
  Administered 2015-10-04: 2 mg via INTRAVENOUS

## 2015-10-04 MED ORDER — FENTANYL CITRATE (PF) 100 MCG/2ML IJ SOLN
INTRAMUSCULAR | Status: DC | PRN
  Administered 2015-10-04: 100 ug via INTRAVENOUS
  Administered 2015-10-04: 50 ug via INTRAVENOUS

## 2015-10-04 MED ORDER — SODIUM CHLORIDE 0.9 % IV SOLN
Freq: Once | INTRAVENOUS | Status: AC
  Administered 2015-10-04: 11:00:00 via INTRAVENOUS

## 2015-10-04 NOTE — Progress Notes (Signed)
Pt received from Endo Blood still hanging alert but sleepy helped into bed Given small amt of ice water swallowed well Husband in room with pt.

## 2015-10-04 NOTE — Interval H&P Note (Signed)
History and Physical Interval Note:  10/04/2015 10:33 AM  Susan [name]  has presented today for surgery, with the diagnosis of Anemia, GI bleed  The various methods of treatment have been discussed with the patient and family. After consideration of risks, benefits and other options for treatment, the patient has consented to  Procedure(s): ESOPHAGOGASTRODUODENOSCOPY (EGD) (N/A) as a surgical intervention .  The patient's history has been reviewed, patient examined, no change in status, stable for surgery.  I have reviewed the patient's chart and labs.  Questions were answered to the patient's satisfaction.     Reeves ForthSteven Paul Armbruster

## 2015-10-04 NOTE — Progress Notes (Signed)
Utilization Review Completed.  

## 2015-10-04 NOTE — Progress Notes (Addendum)
Patient Demographics  Susan [name], is a 44 y.o. female, DOB - May 21, 1971, ZOX:[id number]  Admit date - 10/03/2015   Admitting Physician Elease EtienneAnand D Hongalgi, MD  Outpatient Primary MD for the patient is Gwynneth [name],ROBYN N, MD  LOS - 1   Chief Complaint  Patient presents with  . Loss of Consciousness         Subjective:   Susan [name] today has, No headache, No chest pain, No abdominal pain - No Nausea, No new weakness tingling or numbness, No Cough - SOB. No  BM overnight.  Assessment & Plan    Principal Problem:   Upper GI bleed Active Problems:   Syncope   Acute blood loss anemia   Hypotension   Hyperlipidemia   Hypertension   Acute upper GI bleed  Acute upper GI bleed - Patient with history of LAP-BAND, as it endorses using NSAIDs occasionally, possibly related to gastritis /ulcer,/esophagitis. - GI consult appreciated, plan is for EGD today. - Continue with IV Protonix.  Acute blood loss anemia - Secondary to GI bleed, this lung hemoglobin is 12, hemoglobin is 7.4 this a.m.Susan [name]. - We'll transfuse 1 unit PRBC today. - Ferritin is 15, iron is 114, B-12 is 408 folate within normal limits, iron deficiency anemia, will give IV for him today as well.  Syncope - related to GI bleed and acute blood loss anemia. - Continue to hold hydrochlorothiazide, continue with IV fluids.  Hypertension - Continue with IV fluids, continue to hold hydrochlorothiazide  History of hypertension - Continue to hold meds as hypotensive on presentation  Hypokalemia - Repleted  Hyperlipidemia - Not on medications at home  Alcohol use - On CIWA  Code Status: Full  Family Communication: Father at bedside  Disposition Plan: Home when stable   Procedures  None   Consults   Gastroenterology   Medications  Scheduled Meds: . sodium chloride   Intravenous Once  . ferumoxytol  510 mg  Intravenous Once  . folic acid  1 mg Oral Daily  . Influenza vac split quadrivalent PF  0.5 mL Intramuscular Tomorrow-1000  . multivitamin with minerals  1 tablet Oral Daily  . [START ON 10/07/2015] pantoprazole (PROTONIX) IV  40 mg Intravenous Q12H  . sodium chloride  3 mL Intravenous Q12H  . thiamine  100 mg Oral Daily   Or  . thiamine  100 mg Intravenous Daily   Continuous Infusions: . sodium chloride    . pantoprozole (PROTONIX) infusion 8 mg/hr (10/03/15 1948)   PRN Meds:.acetaminophen **OR** acetaminophen, albuterol, LORazepam **OR** LORazepam, ondansetron **OR** ondansetron (ZOFRAN) IV  DVT Prophylaxis   SCDs   Lab Results  Component Value Date   PLT 283 10/04/2015    Antibiotics   Anti-infectives    None          Objective:   Filed Vitals:   10/04/15 0345 10/04/15 0600 10/04/15 0913 10/04/15 0945  BP: 105/60  101/63 100/61  Pulse: 86   80  Temp: 98.7 F (37.1 C)  98.7 F (37.1 C) 98.3 F (36.8 C)  TempSrc: Oral  Oral Oral  Resp: 11     Height:  5\' 2"  (1.575 m)    Weight:  66.9 kg (147 lb 7.8 oz)    SpO2:  100%    Wt Readings from Last 3 Encounters:  10/04/15 66.9 kg (147 lb 7.8 oz)  07/14/14 62.234 kg (137 lb 3.2 oz)  11/15/13 59.421 kg (131 lb)     Intake/Output Summary (Last 24 hours) at 10/04/15 1005 Last data filed at 10/04/15 0600  Gross per 24 hour  Intake   1275 ml  Output      0 ml  Net   1275 ml     Physical Exam  Awake Alert, Oriented X 3, No new F.N deficits, Normal affect Hilda.AT,PERRAL Supple Neck,No JVD, No cervical lymphadenopathy appriciated.  Symmetrical Chest wall movement, Good air movement bilaterally, CTAB RRR,No Gallops,Rubs or new Murmurs, No Parasternal Heave +ve B.Sounds, Abd Soft, No tenderness, No organomegaly appriciated, No rebound - guarding or rigidity. No Cyanosis, Clubbing or edema, No new Rash or bruise    Data Review   Micro Results Recent Results (from the past 240 hour(s))  MRSA PCR Screening      Status: None   Collection Time: 10/03/15  7:11 PM  Result Value Ref Range Status   MRSA by PCR NEGATIVE NEGATIVE Final    Comment:        The GeneXpert MRSA Assay (FDA approved for NASAL specimens only), is one component of a comprehensive MRSA colonization surveillance program. It is not intended to diagnose MRSA infection nor to guide or monitor treatment for MRSA infections.     Radiology Reports No results found.   CBC  Recent Labs Lab 10/03/15 1410 10/03/15 1908 10/04/15 0120 10/04/15 0930  WBC 12.2* 10.7* 10.0 8.8  HGB 9.8* 8.6* 7.4* 8.1*  HCT 30.2* 25.7* 22.5* 24.5*  PLT 352 271 255 283  MCV 93.8 94.5 93.8 94.6  MCH 30.4 31.6 30.8 31.3  MCHC 32.5 33.5 32.9 33.1  RDW 12.8 12.9 12.9 13.1  LYMPHSABS 5.4*  --   --   --   MONOABS 0.6  --   --   --   EOSABS 0.2  --   --   --   BASOSABS 0.1  --   --   --     Chemistries   Recent Labs Lab 10/03/15 1410  NA 136  K 3.2*  CL 101  CO2 25  GLUCOSE 176*  BUN 23*  CREATININE 1.17*  CALCIUM 8.8*   ------------------------------------------------------------------------------------------------------------------ estimated creatinine clearance is 55.6 mL/min (by C-G formula based on Cr of 1.17). ------------------------------------------------------------------------------------------------------------------ No results for input(s): HGBA1C in the last 72 hours. ------------------------------------------------------------------------------------------------------------------ No results for input(s): CHOL, HDL, LDLCALC, TRIG, CHOLHDL, LDLDIRECT in the last 72 hours. ------------------------------------------------------------------------------------------------------------------ No results for input(s): TSH, T4TOTAL, T3FREE, THYROIDAB in the last 72 hours.  Invalid input(s): FREET3 ------------------------------------------------------------------------------------------------------------------  Recent Labs   10/03/15 1908  VITAMINB12 408  FOLATE 10.2  FERRITIN 15  TIBC 329  IRON 114  RETICCTPCT 0.9    Coagulation profile  Recent Labs Lab 10/03/15 1510  INR 1.11    No results for input(s): DDIMER in the last 72 hours.  Cardiac Enzymes No results for input(s): CKMB, TROPONINI, MYOGLOBIN in the last 168 hours.  Invalid input(s): CK ------------------------------------------------------------------------------------------------------------------ Invalid input(s): POCBNP     Time Spent in minutes   35 minutes   Wilhelmina Hark M.D on 10/04/2015 at 10:05 AM  Between 7am to 7pm - Pager - [phone]  After 7pm go to [email or web address] - password [facility]  [facility]   Office  [phone]

## 2015-10-04 NOTE — Anesthesia Preprocedure Evaluation (Signed)
Anesthesia Evaluation  Patient identified by MRN, date of birth, ID band Patient awake    Reviewed: Allergy & Precautions, NPO status , Patient's Chart, lab work & pertinent test results  History of Anesthesia Complications Negative for: history of anesthetic complications  Airway Mallampati: II  TM Distance: >3 FB Neck ROM: Full    Dental  (+) Teeth Intact   Pulmonary neg pulmonary ROS,    breath sounds clear to auscultation       Cardiovascular hypertension, Pt. on medications  Rhythm:Regular     Neuro/Psych negative neurological ROS     GI/Hepatic Neg liver ROS, Possible gi bleed   Endo/Other  negative endocrine ROS  Renal/GU negative Renal ROS     Musculoskeletal negative musculoskeletal ROS (+)   Abdominal   Peds  Hematology  (+) anemia ,   Anesthesia Other Findings   Reproductive/Obstetrics                             Anesthesia Physical Anesthesia Plan  ASA: II  Anesthesia Plan: MAC   Post-op Pain Management:    Induction: Intravenous  Airway Management Planned: Simple Face Mask, Natural Airway and Nasal Cannula  Additional Equipment: None  Intra-op Plan:   Post-operative Plan:   Informed Consent: I have reviewed the patients History and Physical, chart, labs and discussed the procedure including the risks, benefits and alternatives for the proposed anesthesia with the patient or authorized representative who has indicated his/her understanding and acceptance.   Dental advisory given  Plan Discussed with: CRNA and Surgeon  Anesthesia Plan Comments:         Anesthesia Quick Evaluation

## 2015-10-04 NOTE — Transfer of Care (Signed)
Immediate Anesthesia Transfer of Care Note  Patient: Susan [name]  Procedure(s) Performed: Procedure(s): ESOPHAGOGASTRODUODENOSCOPY (EGD) (N/A)  Patient Location: PACU and Endoscopy Unit  Anesthesia Type:MAC  Level of Consciousness: awake, alert , oriented and patient cooperative  Airway & Oxygen Therapy: Patient Spontanous Breathing and Patient connected to nasal cannula oxygen  Post-op Assessment: Report given to RN, Post -op Vital signs reviewed and stable and Patient moving all extremities  Post vital signs: Reviewed and stable  Last Vitals:  Filed Vitals:   10/04/15 1101  BP: 110/62  Pulse: 109  Temp: 36.8 C  Resp: 12    Complications: No apparent anesthesia complications

## 2015-10-04 NOTE — Progress Notes (Signed)
Started 1 [facility] here for pt. Called to departmenmt to let RN know that unit was just started

## 2015-10-04 NOTE — H&P (View-Only) (Signed)
[facility] Consult: 4:44 PM 10/03/2015     Referring Provider: dr Hyacinth Meeker  Primary Care Physician:  [name], MD Primary Gastroenterologist:  unassigned    Reason for Consultation:  Gi bleed   HPI: Susan [name] is a 44 y.o. female.  [name] History Obesity.  S/p lap band in 2010, at [facility]. Dr. Wenda Low follows her lap band..  S/p lap bil salpingectomy 2014. Status post 12/2014 abdominoplasty at [facility] S/p 10/2013 right carpal tunnel release.   Melenic stools twice this afternoon.  Syncope today at work, hypotensive in ED.  Hgb 9.  coags normal Uses bc powders every few weeks, no bleeding issues before.  No previous upper endoscopy or colonoscopy. Frequent nausea and nonbloody, non-coffee ground emesis. She vomits usually at least once a day. She has never brought this up with her physicians.    Past Medical History  Diagnosis Date  . Hyperlipidemia   . Hypertension     Past Surgical History  Procedure Laterality Date  . Laparoscopic gastric banding    . Cosmetic surgery    . Laparoscopy N/A 03/22/2013    Procedure: LAPAROSCOPY OPERATIVE;  Surgeon: Serita Kyle, MD;  Location: [facility];  Service: Gynecology;  Laterality: N/A;  . Bilateral salpingectomy Bilateral 03/22/2013    Procedure: BILATERAL SALPINGECTOMY;  Surgeon: Serita Kyle, MD;  Location: [facility];  Service: Gynecology;  Laterality: Bilateral;  . Salpingectomy  03/22/2013  . Carpal tunnel release Right 11/15/2013    Procedure: RIGHT CARPAL TUNNEL RELEASE;  Surgeon: Nicki Reaper, MD;  Location: [facility];  Service: Orthopedics;  Laterality: Right;  . Ganglion cyst excision Right 11/15/2013    Procedure: EXCISION CYST RIGHT WRIST;  Surgeon: Nicki Reaper, MD;  Location: [facility];  Service: Orthopedics;  Laterality: Right;    Prior to Admission medications   Medication Sig Start Date End Date Taking? Authorizing Provider  Aspirin-Salicylamide-Caffeine (BC HEADACHE POWDER PO) Take 1 each by mouth daily as needed (headaches).   Yes Historical Provider, MD  hydrochlorothiazide (MICROZIDE) 12.5 MG capsule Take 12.5 mg by mouth daily.   Yes Historical Provider, MD    Scheduled Meds:  Infusions:  PRN Meds:    Allergies as of 10/03/2015  . (No Known Allergies)    Family History  Problem Relation Age of Onset  . Cancer Mother     pancreatic  . Stroke Mother   . Hypertension Father     Social History   Social History  . Marital Status: Married    Spouse Name: N/A  . Number of Children: N/A  . Years of Education: N/A   Occupational History  . Not on file.   Social History Main Topics  . Smoking status: Never Smoker   . Smokeless tobacco: Not on file  . Alcohol Use: Yes     Comment: socially  . Drug Use: No  . Sexual Activity: Not on file   Other Topics Concern  . Not on file   Social History Narrative  REVIEW OF SYSTEMS: Constitutional:  Overall she lost about 70 pounds following the lap band and she has gained about 15 of these back so total weight loss now equaling about 55 pounds ENT:  No nose bleeds Pulm:  No shortness of breath, no cough. CV:  No palpitations, no LE edema.  GU:  No hematuria, no frequency GI:  Occasional solid food dysphagia. No nausea/vomiting as above. Heme:  No issues with low blood counts or excessive bleeding or bruising.   Transfusions:  None ever Neuro:  No headaches, no peripheral tingling or numbness Derm:  No itching, no rash or sores.  Endocrine:  No sweats or chills.  No polyuria or dysuria Immunization:  Did not inquire as to previous immunizations Travel:  None beyond local counties in last few months.    PHYSICAL EXAM: Vital signs in last 24 hours: Filed Vitals:   10/03/15 1534  BP:  92/70  Pulse: 79  Temp:   Resp: 16   Wt Readings from Last 3 Encounters:  07/14/14 137 lb 3.2 oz (62.234 kg)  11/15/13 131 lb (59.421 kg)  06/10/13 138 lb 12.8 oz (62.959 kg)   General: Pleasant, looks well. Well-nourished. Head:  No swelling or facial asymmetry  Eyes:  No scleral icterus, no conjunctival pallor Ears:  Not hard of hearing.  Nose:  No discharge, no congestion Mouth:  Moist mucous membranes, good dentition. Neck:  No mass, no TMG, no JVD. Lungs:  Clear bilaterally. No labored breathing. No cough Heart: RRR. No MRG. S1/S2 audible. Abdomen:  Soft. NT, ND.  No HSM, hernias or masses.   Rectal: Deferred   Musc/Skeltl: No joint swelling, redness or contracture deformity Extremities:  No CCE  Neurologic:  Intelligent. Alert. Oriented 3. No tremors. No limb weakness. Skin:  No rash, no sores. No telangiectasia Tattoos:  On the abdomen and back. Nodes:  No cervical or inguinal adenopathy   Psych:  Pleasant, cooperative, calm.  Intake/Output from previous day:   Intake/Output this shift:    LAB RESULTS:  Recent Labs  10/03/15 1410  WBC 12.2*  HGB 9.8*  HCT 30.2*  PLT 352   BMET Lab Results  Component Value Date   NA 136 10/03/2015   NA 139 11/15/2013   NA 137 03/15/2013   K 3.2* 10/03/2015   K 3.3* 11/15/2013   K 3.8 03/15/2013   CL 101 10/03/2015   CL 96 11/15/2013   CL 100 03/15/2013   CO2 25 10/03/2015   CO2 31 03/15/2013   GLUCOSE 176* 10/03/2015   GLUCOSE 90 11/15/2013   GLUCOSE 91 03/15/2013   BUN 23* 10/03/2015   BUN 9 11/15/2013   BUN 10 03/15/2013   CREATININE 1.17* 10/03/2015   CREATININE 1.00 11/15/2013   CREATININE 0.82 03/15/2013   CALCIUM 8.8* 10/03/2015   CALCIUM 9.4 03/15/2013   LFT No results for input(s): PROT, ALBUMIN, AST, ALT, ALKPHOS, BILITOT, BILIDIR, IBILI in the last 72 hours. PT/INR Lab Results  Component Value Date   INR 1.11 10/03/2015   Hepatitis Panel No results for input(s): HEPBSAG, HCVAB, HEPAIGM,  HEPBIGM in the last 72 hours. C-Diff No components found for: CDIFF Lipase  No results found for: LIPASE  Drugs of Abuse  No results found for: LABOPIA, COCAINSCRNUR, LABBENZ, AMPHETMU, THCU, LABBARB   RADIOLOGY STUDIES: No results found.  ENDOSCOPIC STUDIES: None ever  IMPRESSION:   *  ugi bleed. Rule out ulcer, rule out lap band complication.  *  Anemia, likely due to acute blood   loss. MCV normal.  *  Hypokalemia.  *  Renal insufficiency,   *  Hyperglycemia   PLAN:     *  Iv bid protonix.  egd tomorrow, discussed with patient and she is agreeable..  CBC every 8 hours. Okay to have clears tonight.   Jennye MoccasinSarah Gribbin  10/03/2015, 4:44 PM Pager: [phone]     Attending Addendum I have taken an interval history, reviewed the chart, and examined the patient. I agree with the Advanced Practitioner's note and impression. Patient with onset of vomiting and now with stool concerning for melena which started today. She reports a history of chronic vomiting, as well as recent NSAID use (Goody powder). Hgb down from baseline to 9s. Will monitor serial Hgb, place on IV PPI, and plan for endoscopy in the morning to further evaluate and endoscopically treat if needed pending what we find. The indications, risks, and benefits of EGD were explained to the patient in detail. Risks include but are not limited to bleeding, perforation, adverse reaction to medications, and cardiopulmonary compromise. The patient verbalized understanding and wished to proceed. All questions answered. Further recommendations pending results of the exam. If she has any worsening of symptoms overnight or becomes unstable please contact us. Otherwise will make her NPO at this time.   Ileene PatrickSteven Armbruster, MD [facility] Pager [phone]

## 2015-10-05 ENCOUNTER — Encounter (HOSPITAL_COMMUNITY): Payer: Self-pay | Admitting: Gastroenterology

## 2015-10-05 LAB — CBC
HEMATOCRIT: 25.2 % — AB (ref 36.0–46.0)
Hemoglobin: 8.3 g/dL — ABNORMAL LOW (ref 12.0–15.0)
MCH: 31 pg (ref 26.0–34.0)
MCHC: 32.9 g/dL (ref 30.0–36.0)
MCV: 94 fL (ref 78.0–100.0)
PLATELETS: 249 10*3/uL (ref 150–400)
RBC: 2.68 MIL/uL — ABNORMAL LOW (ref 3.87–5.11)
RDW: 13.5 % (ref 11.5–15.5)
WBC: 6.2 10*3/uL (ref 4.0–10.5)

## 2015-10-05 LAB — BASIC METABOLIC PANEL
Anion gap: 6 (ref 5–15)
BUN: <5 mg/dL — ABNORMAL LOW (ref 6–20)
CALCIUM: 8.2 mg/dL — AB (ref 8.9–10.3)
CO2: 23 mmol/L (ref 22–32)
CREATININE: 0.77 mg/dL (ref 0.44–1.00)
Chloride: 111 mmol/L (ref 101–111)
GFR calc Af Amer: >60 mL/min (ref >60)
GLUCOSE: 99 mg/dL (ref 65–99)
Potassium: 4.1 mmol/L (ref 3.5–5.1)
Sodium: 140 mmol/L (ref 135–145)

## 2015-10-05 LAB — TYPE AND SCREEN
ABO/RH(D): A POS
Antibody Screen: NEGATIVE
UNIT DIVISION: 0
UNIT DIVISION: 0

## 2015-10-05 LAB — HEMOGLOBIN AND HEMATOCRIT, BLOOD
HCT: 28.6 % — ABNORMAL LOW (ref 36.0–46.0)
Hemoglobin: 9.3 g/dL — ABNORMAL LOW (ref 12.0–15.0)

## 2015-10-05 LAB — H. PYLORI ANTIBODY, IGG

## 2015-10-05 MED ORDER — FERROUS SULFATE 325 (65 FE) MG PO TABS: 325.0000 mg | ORAL_TABLET | Freq: Three times a day (TID) | ORAL | Status: DC

## 2015-10-05 MED ORDER — PANTOPRAZOLE SODIUM 40 MG PO TBEC: DELAYED_RELEASE_TABLET | ORAL | Status: DC

## 2015-10-05 NOTE — Progress Notes (Signed)
     [facility] Progress Note  Subjective:  Hgb 9.3. S/P EGD yesterday:  ENDOSCOPIC IMPRESSION: Mild LA grade A esophagitis Gastric lap band intact Roughly 1cm clean based ulcer with small flat red spot noted in the proximal fundus - no endoscopic intervention performed Mild gastric erythema otherwise Normal duodenum No evidence of active bleeding on this exam. Unclear if ulcer is related to H pylori or NSAID use RECOMMENDATIONS: Return to medical ward Clear liquid diet okay Continue IV protonix while hospitalized Lab for H pylori IgG serology - treat for H pylori if positive Transition to protonix 40mg  twice daily upon discharge for 2 weeks, then once daily thereafter Consider discharge later today or tomorrow if repeat H/H stable after transfusion, pending hospital course NO NSAIDS (no Goody Powder) Repeat EGD in 3 months to assess for mucosal healing   Objective:  Vital signs in last 24 hours: Temp:  [97.8 F (36.6 C)-98.6 F (37 C)] 98.6 F (37 C) (11/10 0700) Pulse Rate:  [67-109] 73 (11/10 0900) Resp:  [12-15] 14 (11/10 0700) BP: (91-132)/(58-83) 121/83 mmHg (11/10 0700) SpO2:  [99 %-100 %] 100 % (11/10 0700) Last BM Date: 10/04/15 General:   Alert,  Well-developed, in NAD Heart:  Regular rate and rhythm; no murmurs Pulm;lungs clear Abdomen:  Soft, nontender and nondistended. Normal bowel sounds, without guarding, and without rebound.     Intake/Output from previous day: 11/09 0701 - 11/10 0700 In: 2627.5 [I.V.:2158; Blood:352.5; IV Piggyback:117] Out: -  Intake/Output this shift: Total I/O In: 240 [P.O.:240] Out: -   Lab Results:  Recent Labs  10/04/15 0120 10/04/15 0930  10/04/15 1848 10/05/15 0343 10/05/15 0855  WBC 10.0 8.8  --   --  6.2  --   HGB 7.4* 8.1*  < > 10.5* 8.3* 9.3*  HCT 22.5* 24.5*  < > 31.3* 25.2* 28.6*  PLT 255 283  --   --  249  --   < > = values in this interval not displayed. BMET  Recent Labs  10/03/15 1410  10/04/15 0930 10/05/15 0343  NA 136 139 140  K 3.2* 4.0 4.1  CL 101 108 111  CO2 25 24 23   GLUCOSE 176* 92 99  BUN 23* 16 <5*  CREATININE 1.17* 0.77 0.77  CALCIUM 8.8* 8.4* 8.2*   LFT No results for input(s): PROT, ALBUMIN, AST, ALT, ALKPHOS, BILITOT, BILIDIR, IBILI in the last 72 hours. PT/INR  Recent Labs  10/03/15 1510  LABPROT 14.5  INR 1.11     ASSESSMENT/PLAN:   44 yo female admitted with anemia and GI bleed, s/p EGD that showed esophagitis and ulcer. Pt getting ready to go home. Should continue bid PPI x 2 weeks, then once daily PPI/ Will schedule f/u in GI office in 6-8 weeks to schedule EGD in 3 months.     LOS: 2 days   Heriberto Stmartin, Tollie PizzaLori P PA-C 10/05/2015, Pager [phone] Mon-Fri 8a-5p [phone] after 5p, weekends, holidays

## 2015-10-05 NOTE — Discharge Instructions (Signed)
Follow with Primary MD Gwynneth AlimentSANDERS,ROBYN N, MD in 7 days   Get CBC, CMP,checked  by Primary MD next visit.    Activity: As tolerated with Full fall precautions use walker/cane & assistance as needed   Disposition Home    Diet: Heart Healthy  , with feeding assistance and aspiration precautions.   On your next visit with your primary care physician please Get Medicines reviewed and adjusted.   Please request your Prim.MD to go over all Hospital Tests and Procedure/Radiological results at the follow up, please get all Hospital records sent to your Prim MD by signing hospital release before you go home.   If you experience worsening of your admission symptoms, develop shortness of breath, life threatening emergency, suicidal or homicidal thoughts you must seek medical attention immediately by calling 911 or calling your MD immediately  if symptoms less severe.  You Must read complete instructions/literature along with all the possible adverse reactions/side effects for all the Medicines you take and that have been prescribed to you. Take any new Medicines after you have completely understood and accpet all the possible adverse reactions/side effects.    Do not drive when taking Pain medications.    Do not take more than prescribed Pain, Sleep and Anxiety Medications  Special Instructions: If you have smoked or chewed Tobacco  in the last 2 yrs please stop smoking, stop any regular Alcohol  and or any Recreational drug use.  Wear Seat belts while driving.   Please note  You were cared for by a hospitalist during your hospital stay. If you have any questions about your discharge medications or the care you received while you were in the hospital after you are discharged, you can call the unit and asked to speak with the hospitalist on call if the hospitalist that took care of you is not available. Once you are discharged, your primary care physician will handle any further medical issues.  Please note that NO REFILLS for any discharge medications will be authorized once you are discharged, as it is imperative that you return to your primary care physician (or establish a relationship with a primary care physician if you do not have one) for your aftercare needs so that they can reassess your need for medications and monitor your lab values.

## 2015-10-05 NOTE — Discharge Summary (Addendum)
Susan [name], is a 44 y.o. female  DOB [birth year]  MRN [id number].  Admission date:  10/03/2015  Admitting Physician  Elease Etienne, MD  Discharge Date:  10/05/2015   Primary MD  [name], MD  Recommendations for primary care physician for things to follow:  - Patient to have CBC checked during next visit. - She is instructed to avoid NSAIDs. - Hydrochlorothiazide has been on discharge given soft blood pressure during hospital stay. - Patient will need repeat endoscopy in 3 months   Admission Diagnosis  Hyperlipidemia [E78.5] Upper GI bleed [K92.2] Essential hypertension [I10]   Discharge Diagnosis  Hyperlipidemia [E78.5] Upper GI bleed [K92.2] Essential hypertension [I10]   Principal Problem:   Upper GI bleed Active Problems:   Syncope   Acute blood loss anemia   Hypotension   Hyperlipidemia   Hypertension   Acute upper GI bleed      Past Medical History  Diagnosis Date  . Hyperlipidemia   . Hypertension     Past Surgical History  Procedure Laterality Date  . Laparoscopic gastric banding    . Cosmetic surgery    . Laparoscopy N/A 03/22/2013    Procedure: LAPAROSCOPY OPERATIVE;  Surgeon: Serita Kyle, MD;  Location: [facility];  Service: Gynecology;  Laterality: N/A;  . Bilateral salpingectomy Bilateral 03/22/2013    Procedure: BILATERAL SALPINGECTOMY;  Surgeon: Serita Kyle, MD;  Location: [facility];  Service: Gynecology;  Laterality: Bilateral;  . Salpingectomy  03/22/2013  . Carpal tunnel release Right 11/15/2013    Procedure: RIGHT CARPAL TUNNEL RELEASE;  Surgeon: Nicki Reaper, MD;  Location: [facility];  Service: Orthopedics;  Laterality: Right;  . Ganglion cyst excision Right 11/15/2013    Procedure: EXCISION CYST RIGHT WRIST;  Surgeon: Nicki Reaper, MD;  Location: [facility];  Service: Orthopedics;  Laterality: Right;  .  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 Patient states that ever since her lap banding, she eats very little and not healthy. She has intermittent nausea and vomiting. A week ago she had 5 days of nausea and nonbloody emesis and was barely able to keep down any food or drinks. At that time she was constipated and did not have a BM. This eventually resolved and for the next 3 or 4 days she  was able to eat and drink. She uses when necessary BC powder up to 3-4 times a month and the last time was approximately 1-2 weeks ago. She went to work this morning without complaints. At about lunchtime, she used the bathroom and noticed black tarry stools. When she stood up to wash her hands, she experienced dizziness, lightheadedness, clamminess and went and sat down in her chair. She requested her staff to bring a blood pressure cuff to check her blood pressure. She then wanted to use the restroom again and her staff was assisting her when she passed out before reaching the bathroom. Her staff held her and hence she did not sustain a fall or injuries. Her syncope may have lasted as a few seconds. She was then laid down in Trendelenburg position. She felt better. She presented to the ED where her systolics were in the 60s. She was bolused  with 2 L of IV normal saline and the blood pressures improved to the 90s. She is feeling much better without any dizziness, lightheadedness. She did not have chest pain, dyspnea or palpitations. She had another episode of black tarry stools in the ED. She has irregular and minimal menstrual bleeding due to IUD. Rectal exam by EDP showed melanotic stools which were FOBT positive. GI has been consulted and plan for EGD tomorrow. Hospitalist admission requested.  Hospital Course   Acute upper GI bleed - Patient with history of LAP-BAND, endorses using NSAIDs occasionally. - GI consult appreciated, patient had EGD 11/9. Was significant for esophagitis, clean-based 1 cm gastric ulcer not actively bleeding - Patient required 1 unit PRBC transfusion on 11/9, hemoglobin has been stable after transfusion, hemoglobin on discharge is 9.3. - H pylori antibody IgG is negative, so no indication for treatment. - Patient is instructed to stop using NSAIDs, continue with PPI, iron supplement on discharge, need repeat endoscopy in 3 month.  Acute blood loss anemia - Secondary to GI bleed, baseline hemoglobin is 12, myoglobin dropped to 7.4, transfuse 1 unit PRBC 11/9, is 9.3 on discharge. - Transfused 1 unit PRBC 11/9. - Ferritin is 15, iron is 114, B-12 is 408 folate within normal limits, iron deficiency anemia, received IV Feraheme 11/9, discharged on iron supplements.  Syncope - related to GI bleed and acute blood loss anemia. - Continue to hold hydrochlorothiazide on discharge.  Hypertension - Hold hydrochlorothiazide on discharge   Hypokalemia - Repleted  Hyperlipidemia - Not on medications at home     Discharge Condition:  Stable   Follow UP  Follow-up Information    Follow up with Dorothyann PengSanders, Robyn. Call in 1 week.   Why:  Hospitalization follow-up   Contact information:   [address]  [place] [address]  [phone] Appointment: October 16, 2015 @ 4:00pm         Follow up with Ruffin FrederickSteven Paul Armbruster, MD. Call in 3 months.   Specialty:  Gastroenterology   Why:  for repeat EGD   Contact information:   [address] [place] [address] [phone]         Discharge Instructions  and  Discharge Medications        Discharge Instructions    Diet - low sodium heart healthy    Complete by:  As directed      Discharge instructions    Complete by:  As directed   Follow with Primary MD Gwynneth AlimentSANDERS,ROBYN N, MD in 7 days   Get CBC, CMP,checked  by  Primary MD next visit.    Activity: As tolerated with Full fall precautions use walker/cane & assistance as needed   Disposition Home    Diet: Heart Healthy  , with feeding assistance and aspiration precautions.   On your next visit with your primary care physician please Get Medicines reviewed and adjusted.   Please request your Prim.MD to go over all Hospital Tests and Procedure/Radiological results at the follow up, please get all Hospital records sent to your Prim MD by signing hospital release before you go home.   If you experience worsening of your admission symptoms, develop shortness of breath, life threatening emergency, suicidal or homicidal thoughts you must seek medical attention immediately by calling 911 or calling your MD immediately  if symptoms less severe.  You Must read complete instructions/literature along with all the possible adverse reactions/side effects for all the Medicines you take and that have been prescribed to you. Take any new Medicines after you have completely understood and accpet all the possible adverse reactions/side effects.    Do not drive when taking Pain medications.    Do not take more than prescribed Pain, Sleep and Anxiety Medications  Special Instructions: If you have smoked or chewed Tobacco  in the last 2 yrs please stop smoking, stop any regular Alcohol  and or any Recreational drug use.  Wear Seat belts while  driving.   Please note  You were cared for by a hospitalist during your hospital stay. If you have any questions about your discharge medications or the care you received while you were in the hospital after you are discharged, you can call the unit and asked to speak with the hospitalist on call if the hospitalist that took care of you is not available. Once you are discharged, your primary care physician will handle any further medical issues. Please note that NO REFILLS for any discharge medications will be authorized once you are discharged, as it is imperative that you return to your primary care physician (or establish a relationship with a primary care physician if you do not have one) for your aftercare needs so that they can reassess your need for medications and monitor your lab values.     Increase activity slowly    Complete by:  As directed             Medication List    STOP taking these medications        BC HEADACHE POWDER PO     hydrochlorothiazide 12.5 MG capsule  Commonly known as:  MICROZIDE      TAKE these medications        ferrous sulfate 325 (65 FE) MG tablet  Take 1 tablet (325 mg total) by mouth 3 (three) times daily with meals.     pantoprazole 40 MG tablet  Commonly known as:  PROTONIX  Please use 1 tablet oral twice daily for 2 weeks, then 1 tablet oral daily thereafter.          Diet and Activity recommendation: See Discharge Instructions above   Consults obtained -  Gastroenterology   Major procedures and Radiology Reports - PLEASE review detailed and final reports for all details, in brief -   Endoscopy 11/9: ENDOSCOPIC IMPRESSION: Mild LA grade A esophagitis Gastric lap band intact Roughly 1cm clean based ulcer with small flat red spot noted in the proximal fundus - no endoscopic intervention performed Mild gastric erythema otherwise Normal duodenum No evidence of active bleeding on this exam. Unclear  if ulcer is related to H  pylori or NSAID use RECOMMENDATIONS: Return to medical ward Clear liquid diet okay Continue IV protonix while hospitalized Lab for H pylori IgG serology - treat for H pylori if positive Transition to protonix  twice daily upon discharge for 2 weeks, then once daily thereafter Consider discharge later today or tomorrow if repeat H/H stable after transfusion, pending hospital course NO NSAIDS (no Goody Powder) Repeat EGD in 3 months to assess for mucosal healing  No results found.  Micro Results     Recent Results (from the past 240 hour(s))  MRSA PCR Screening     Status: None   Collection Time: 10/03/15  7:11 PM  Result Value Ref Range Status   MRSA by PCR NEGATIVE NEGATIVE Final    Comment:        The GeneXpert MRSA Assay (FDA approved for NASAL specimens only), is one component of a comprehensive MRSA colonization surveillance program. It is not intended to diagnose MRSA infection nor to guide or monitor treatment for MRSA infections.        Today   Subjective:   Susan [name] today has no headache,no chest abdominal pain,no new weakness tingling or numbness, feels much better wants to go home today. No bowel movement since admission.  Objective:   Blood pressure 121/83, pulse 73, temperature 98.6 F (37 C), temperature source Oral, resp. rate 14, height  (1.575 m), weight 66.9 kg (147 lb 7.8 oz), SpO2 100 %.   Intake/Output Summary (Last 24 hours) at 10/05/15 1028 Last data filed at 10/05/15 0951  Gross per 24 hour  Intake 2562.5 ml  Output      0 ml  Net 2562.5 ml    Exam Awake Alert, Oriented x 3, No new F.N deficits, Normal affect [place].AT,PERRAL Supple Neck,No JVD, Symmetrical Chest wall movement, Good air movement bilaterally, CTAB RRR,No Gallops,Rubs or new Murmurs, No Parasternal Heave +ve B.Sounds, Abd Soft, Non tender,  No rebound -guarding or rigidity. No Cyanosis, Clubbing or edema, No new Rash or bruise  Data Review   CBC w  Diff:  Lab Results  Component Value Date   WBC 6.2 10/05/2015   HGB 9.3* 10/05/2015   HCT 28.6* 10/05/2015   PLT 249 10/05/2015   LYMPHOPCT 44 10/03/2015   MONOPCT 5 10/03/2015   EOSPCT 2 10/03/2015   BASOPCT 1 10/03/2015    CMP:  Lab Results  Component Value Date   NA 140 10/05/2015   K 4.1 10/05/2015   CL 111 10/05/2015   CO2 23 10/05/2015   BUN <5* 10/05/2015   CREATININE 0.77 10/05/2015  .   Total Time in preparing paper work, data evaluation and todays exam - 35 minutes  Layden Caterino M.D on 10/05/2015 at 10:28 AM  [facility]   Office  [phone]

## 2015-10-05 NOTE — Progress Notes (Signed)
Discharged to home in stable condition. Discharge instructions given with emphasis on her follow-up instructions.  Prescription for ferrous sulfate and Protonix was called by MD to the Pharmacy.

## 2015-10-05 NOTE — Progress Notes (Signed)
Patient is very upset stating that nobody has been in her room since 0500 this morning.  Her IV has been going off and she decided to discontinue it herself.  Both of her IV were on her antecubital area.  Dr. Randol KernElgergawy came to check on her and possibly discharge her today if all her lab works is good.  She stated that she can only take so many med at a time due to her lapband otherwise, she will throw-up.  A friend of her, Camille-an [facility] of [facility], is visiting her and was there when she stated that she can not this med (Multivitamin and Folic).  She told me to go out of her room.

## 2015-10-05 NOTE — Op Note (Addendum)
[name], [name], Susan [name]  MR#: [id number] BIRTHDATE: 01/05/1971 , 44  yrs. old GENDER: female ENDOSCOPIST: Benancio DeedsSteven P Alegandro Macnaughton, MD REFERRED BY: PROCEDURE DATE:  10/04/2015 PROCEDURE:  EGD, diagnostic ASA CLASS:     Class III INDICATIONS:  melena, anemia, recent NSAID use. MEDICATIONS: Per Anesthesia TOPICAL ANESTHETIC:  DESCRIPTION OF PROCEDURE: After the risks benefits and alternatives of the procedure were thoroughly explained, informed consent was obtained.  The Pentax Gastroscope [id number] endoscope was introduced through the mouth and advanced to the second portion of the duodenum , Without limitations.  The instrument was slowly withdrawn as the mucosa was fully examined.    FINDINGS: The proximal and mid esophagus were normal.  There was mild LA grade A esophagitis noted at the GEJ.  GEJ/SCJ noted at 34cm from the incisors.  Gastric pouch was roughly 4-5cm in size, with distal aspect around 38cm from the incisors.  The patient had a gastric lap band noted on retroflexion which appeared intact. Proximal to the lap band on retroflexion of the lesser curvature, in the most proximal fundus there was a roughly 1cm ulcer noted, it was clean based with a small red flat spot in the middle.  No high risk stigmata for bleeding was appreciated thus no endoscopic therapy was performed.  There was some mild erythema surrounding the ulcer.  There was no bleeding or oozing at the site.  The remainder of the stomach appeared normal, other than some mild patchy erythema noted.  The duodenal bulb and 2nd portion of the duodenum were otherwise normal.  Retroflexed views revealed as previously described.     The scope was then withdrawn from the patient and the procedure completed.  COMPLICATIONS: There were no immediate complications.     ENDOSCOPIC IMPRESSION: Mild LA grade A  esophagitis Gastric lap band intact Roughly 1cm clean based ulcer with small flat red spot noted in the proximal fundus - no endoscopic intervention performed Mild gastric erythema otherwise Normal duodenum  No evidence of active bleeding on this exam. Unclear if ulcer is related to H pylori or NSAID use  RECOMMENDATIONS: Return to medical ward Clear liquid diet okay Continue IV protonix while hospitalized Lab for H pylori IgG serology - treat for H pylori if positive Transition to protonix 40mg  twice daily upon discharge for 2 weeks, then once daily thereafter Consider discharge later today or tomorrow if repeat H/H stable after transfusion, pending hospital course NO NSAIDS (no Goody Powder) Repeat EGD in 3 months to assess for mucosal healing  This document was edited on 10/05/15 as for some reason the original report did not enter Epic on 10/04/15. Document saved again  eSigned:  Benancio DeedsSteven P Vinessa Macconnell, MD 10/05/2015 7:05 AM Revised: 10/05/2015 7:05 AM   CC:  PATIENT NAME:  Susan [name], Susan [name] MR#: [id number]

## 2015-10-05 NOTE — Anesthesia Postprocedure Evaluation (Signed)
  Anesthesia Post-op Note  Patient: Susan [name]  Procedure(s) Performed: Procedure(s): ESOPHAGOGASTRODUODENOSCOPY (EGD) (N/A)  Patient Location: Endoscopy Unit  Anesthesia Type:MAC  Level of Consciousness: awake  Airway and Oxygen Therapy: Patient Spontanous Breathing  Post-op Pain: none  Post-op Assessment: Post-op Vital signs reviewed, Patient's Cardiovascular Status Stable, Respiratory Function Stable, Patent Airway, No signs of Nausea or vomiting and Pain level controlled              Post-op Vital Signs: Reviewed and stable  Last Vitals:  Filed Vitals:   10/05/15 0900  BP:   Pulse: 73  Temp:   Resp:     Complications: No apparent anesthesia complications

## 2016-01-25 ENCOUNTER — Encounter: Payer: Self-pay | Admitting: Gastroenterology

## 2016-02-23 ENCOUNTER — Encounter: Payer: PRIVATE HEALTH INSURANCE | Admitting: Gastroenterology

## 2016-05-16 ENCOUNTER — Encounter: Payer: Self-pay | Admitting: Gastroenterology

## 2016-07-09 ENCOUNTER — Telehealth: Payer: Self-pay

## 2016-07-09 NOTE — Telephone Encounter (Signed)
She can continue it, should not make a difference. She should have a repeat CBC otherwise if none on file since her last was done (in November) at time of anemia. Thanks

## 2016-07-09 NOTE — Telephone Encounter (Signed)
Dr Adela LankArmbruster,      What are your feelings on holding iron before an EGD?                                                            Thank you,                                                                             Angela/PV

## 2016-07-16 ENCOUNTER — Ambulatory Visit (AMBULATORY_SURGERY_CENTER): Payer: Self-pay

## 2016-07-16 VITALS — Ht 62.0 in | Wt 143.2 lb

## 2016-07-16 DIAGNOSIS — Z8719 Personal history of other diseases of the digestive system: Secondary | ICD-10-CM

## 2016-07-16 NOTE — Progress Notes (Signed)
Per pt, no allergies to soy or egg products.Pt not taking any weight loss meds or using  O2 at home. 

## 2016-07-19 ENCOUNTER — Encounter: Payer: BC Managed Care – PPO | Admitting: Gastroenterology

## 2016-07-22 ENCOUNTER — Encounter: Payer: BC Managed Care – PPO | Admitting: Gastroenterology

## 2016-07-22 ENCOUNTER — Encounter: Payer: Self-pay | Admitting: Gastroenterology

## 2016-07-22 ENCOUNTER — Ambulatory Visit (AMBULATORY_SURGERY_CENTER): Payer: Managed Care, Other (non HMO) | Admitting: Gastroenterology

## 2016-07-22 VITALS — BP 133/96 | HR 90 | Temp 98.0°F | Resp 13 | Ht 62.0 in | Wt 143.0 lb

## 2016-07-22 DIAGNOSIS — Z8719 Personal history of other diseases of the digestive system: Secondary | ICD-10-CM | POA: Diagnosis not present

## 2016-07-22 DIAGNOSIS — K2970 Gastritis, unspecified, without bleeding: Secondary | ICD-10-CM

## 2016-07-22 MED ORDER — SODIUM CHLORIDE 0.9 % IV SOLN: 500.0000 mL | INTRAVENOUS | Status: DC

## 2016-07-22 NOTE — Progress Notes (Signed)
To recovery, report to [place], RN, VSS 

## 2016-07-22 NOTE — Patient Instructions (Addendum)
YOU HAD AN ENDOSCOPIC PROCEDURE TODAY AT THE [facility]:   Refer to the procedure report that was given to you for any specific questions about what was found during the examination.  If the procedure report does not answer your questions, please call your gastroenterologist to clarify.  If you requested that your care partner not be given the details of your procedure findings, then the procedure report has been included in a sealed envelope for you to review at your convenience later.  YOU SHOULD EXPECT: Some feelings of bloating in the abdomen. Passage of more gas than usual.  Walking can help get rid of the air that was put into your GI tract during the procedure and reduce the bloating. If you had a lower endoscopy (such as a colonoscopy or flexible sigmoidoscopy) you may notice spotting of blood in your stool or on the toilet paper. If you underwent a bowel prep for your procedure, you may not have a normal bowel movement for a few days.  Please Note:  You might notice some irritation and congestion in your nose or some drainage.  This is from the oxygen used during your procedure.  There is no need for concern and it should clear up in a day or so.  SYMPTOMS TO REPORT IMMEDIATELY:     Following upper endoscopy (EGD)  Vomiting of blood or coffee ground material  New chest pain or pain under the shoulder blades  Painful or persistently difficult swallowing  New shortness of breath  Fever of 100F or higher  Black, tarry-looking stools  For urgent or emergent issues, a gastroenterologist can be reached at any hour by calling [phone].   DIET:  We do recommend a small meal at first, but then you may proceed to your regular diet.  Drink plenty of fluids but you should avoid alcoholic beverages for 24 hours.  ACTIVITY:  You should plan to take it easy for the rest of today and you should NOT DRIVE or use heavy machinery until tomorrow (because of the sedation medicines  used during the test).    FOLLOW UP: Our staff will call the number listed on your records the next business day following your procedure to check on you and address any questions or concerns that you may have regarding the information given to you following your procedure. If we do not reach you, we will leave a message.  However, if you are feeling well and you are not experiencing any problems, there is no need to return our call.  We will assume that you have returned to your regular daily activities without incident.  If any biopsies were taken you will be contacted by phone or by letter within the next 1-3 weeks.  Please call us at [phone] if you have not heard about the biopsies in 3 weeks.    SIGNATURES/CONFIDENTIALITY: You and/or your care partner have signed paperwork which will be entered into your electronic medical record.  These signatures attest to the fact that that the information above on your After Visit Summary has been reviewed and is understood.  Full responsibility of the confidentiality of this discharge information lies with you and/or your care-partner.   AWAIT BIOPSY RESULTS  AVOID NSAIDS !

## 2016-07-22 NOTE — Op Note (Signed)
[facility] Patient Name: Susan [name] Procedure Date: 07/22/2016 10:26 AM MRN: [id number] Endoscopist: Viviann Spare P. Adela Lank , MD Age: 45 Referring MD:  Date of Birth: 08/24/1971 Gender: Female Account #: [id number] Procedure:                Upper GI endoscopy Indications:              Follow-up of ulcer of the GI tract, prior upper GI                            bleed from 1cm ulcer in gastric fundus Nov 2016,                            history of gastric lap band Medicines:                Monitored Anesthesia Care Procedure:                Pre-Anesthesia Assessment:                           - Prior to the procedure, a History and Physical                            was performed, and patient medications and                            allergies were reviewed. The patient's tolerance of                            previous anesthesia was also reviewed. The risks                            and benefits of the procedure and the sedation                            options and risks were discussed with the patient.                            All questions were answered, and informed consent                            was obtained. Prior Anticoagulants: The patient has                            taken no previous anticoagulant or antiplatelet                            agents. ASA Grade Assessment: II - A patient with                            mild systemic disease. After reviewing the risks                            and benefits, the patient was deemed in  satisfactory condition to undergo the procedure.                           After obtaining informed consent, the endoscope was                            passed under direct vision. Throughout the                            procedure, the patient's blood pressure, pulse, and                            oxygen saturations were monitored continuously. The                            Model GIF-HQ190  [id number]) scope was introduced                            through the mouth, and advanced to the second part                            of duodenum. The upper GI endoscopy was                            accomplished without difficulty. The patient                            tolerated the procedure well. Scope In: Scope Out: Findings:                 Esophagogastric landmarks were identified: the                            Z-line was found at 34 cm from the incisors.                           The exam of the esophagus was otherwise normal.                           Evidence of a gastric lap band was found in the                            gastric fundus / cardia. The previously noted ulcer                            in the gastric fundus had completely healed                           Patchy moderate inflammation characterized by                            erosions, erythema and granularity was found in the  gastric body and in the gastric antrum. Biopsies                            were taken with a cold forceps for Helicobacter                            pylori testing.                           The exam of the stomach was otherwise normal.                           The duodenal bulb and second portion of the                            duodenum were normal. Complications:            No immediate complications. Estimated blood loss:                            Minimal. Estimated Blood Loss:     Estimated blood loss was minimal. Impression:               - Esophagogastric landmarks identified.                           - A evidence of gastric lap band was found.                           - Previously noted ulcer has intervally healed                           - Gastritis. Biopsied.                           - Normal duodenal bulb and second portion of the                            duodenum. Recommendation:           - Patient has a contact number available  for                            emergencies. The signs and symptoms of potential                            delayed complications were discussed with the                            patient. Return to normal activities tomorrow.                            Written discharge instructions were provided to the                            patient.                           -  Resume previous diet.                           - Continue present medications (protonix)                           - Avoid NSAIDs                           - Await pathology results. Viviann Spare P. Maheen Cwikla, MD 07/22/2016 10:47:10 AM This report has been signed electronically.

## 2016-07-22 NOTE — Progress Notes (Signed)
Called to room to assist during endoscopic procedure.  Patient ID and intended procedure confirmed with present staff. Received instructions for my participation in the procedure from the performing physician.  

## 2016-07-23 ENCOUNTER — Telehealth: Payer: Self-pay | Admitting: *Deleted

## 2016-07-23 NOTE — Telephone Encounter (Signed)
  Follow up Call-  Call back number 07/22/2016  Post procedure Call Back phone  # [phone]  Permission to leave phone message Yes  Some recent data might be hidden     Patient questions:  Do you have a fever, pain , or abdominal swelling? No. Pain Score  0 *  Have you tolerated food without any problems? Yes.    Have you been able to return to your normal activities? Yes.    Do you have any questions about your discharge instructions: Diet   No. Medications  No. Follow up visit  No.  Do you have questions or concerns about your Care? No.  Actions: * If pain score is 4 or above: No action needed, pain <4.

## 2016-07-26 ENCOUNTER — Encounter: Payer: Self-pay | Admitting: Gastroenterology

## 2016-09-10 ENCOUNTER — Other Ambulatory Visit: Payer: Self-pay | Admitting: Dentistry

## 2016-12-02 DIAGNOSIS — G5620 Lesion of ulnar nerve, unspecified upper limb: Secondary | ICD-10-CM | POA: Insufficient documentation

## 2016-12-18 DIAGNOSIS — M47812 Spondylosis without myelopathy or radiculopathy, cervical region: Secondary | ICD-10-CM | POA: Insufficient documentation

## 2017-02-26 ENCOUNTER — Ambulatory Visit (INDEPENDENT_AMBULATORY_CARE_PROVIDER_SITE_OTHER): Payer: Managed Care, Other (non HMO)

## 2017-02-26 ENCOUNTER — Ambulatory Visit (INDEPENDENT_AMBULATORY_CARE_PROVIDER_SITE_OTHER): Payer: Managed Care, Other (non HMO) | Admitting: Orthopaedic Surgery

## 2017-02-26 VITALS — BP 123/91 | HR 65

## 2017-02-26 DIAGNOSIS — R200 Anesthesia of skin: Secondary | ICD-10-CM | POA: Diagnosis not present

## 2017-02-26 DIAGNOSIS — M25512 Pain in left shoulder: Secondary | ICD-10-CM

## 2017-02-26 DIAGNOSIS — M542 Cervicalgia: Secondary | ICD-10-CM | POA: Diagnosis not present

## 2017-02-26 DIAGNOSIS — M47812 Spondylosis without myelopathy or radiculopathy, cervical region: Secondary | ICD-10-CM

## 2017-02-26 MED ORDER — DIAZEPAM 5 MG PO TABS: ORAL_TABLET | ORAL | 0 refills | Status: DC

## 2017-02-26 NOTE — Progress Notes (Addendum)
Office Visit Note   Patient: Susan [name]           Date of Birth: Apr 25, 1971           MRN: [id number] Visit Date: 02/26/2017              Requested by: Dorothyann Peng, MD [address] [place], [place] [address] PCP: [name], MD   Assessment & Plan: Visit Diagnoses:  1. Left shoulder pain, unspecified chronicity   2. Hand numbness   3. Spondylosis without myelopathy or radiculopathy, cervical region     Plan: We'll proceed with cervical MRI scan to evaluate her for C7 C8 compression left greater than right with her cervical spondylosis and persistent symptoms.  Follow-Up Instructions: Return in about 3 weeks (around 03/19/2017).   Orders:  Orders Placed This Encounter  Procedures  . XR Shoulder Left  . XR Cervical Spine 2 or 3 views  . MR Cervical Spine w/o contrast   Meds ordered this encounter  Medications  . diazepam (VALIUM) 5 MG tablet    Sig: Take one tablet one hour prior to procedure. Repeat if needed/ Must have driver.    Dispense:  2 tablet    Refill:  0      Procedures: No procedures performed   Clinical Data: No additional findings.   Subjective: Chief Complaint  Patient presents with  . Left Hand - Pain  . Left Shoulder - Pain    Patient presents with left shoulder pain. She was sent here by a hand specialist for "tightness" around her left shoulder. She states that it radiates down the left arm. She does have numbness and tingling in the left hand. She denies neck pain.    Review of Systems  Constitutional: Negative for chills and diaphoresis.  HENT: Negative for ear discharge, ear pain and nosebleeds.   Eyes: Negative for discharge and visual disturbance.  Respiratory: Negative for cough, choking and shortness of breath.   Cardiovascular: Negative for chest pain and palpitations.       Positive for tension and hyperlipidemia.  Gastrointestinal: Negative for abdominal distention and abdominal pain.       Previous  lap band surgery.  Endocrine: Negative for cold intolerance and heat intolerance.  Genitourinary: Negative for flank pain and hematuria.  Musculoskeletal:       Positive for decubitus tunnel syndrome on the right. Positive for neck pain with cervical spondylosis.  Skin: Negative for rash and wound.  Neurological: Negative for seizures and speech difficulty.       History of syncope  Hematological: Negative for adenopathy. Does not bruise/bleed easily.  Psychiatric/Behavioral: Negative for agitation and suicidal ideas.     Objective: Vital Signs: BP (!) 123/91   Pulse 65   Physical Exam  Constitutional: She is oriented to person, place, and time. She appears well-developed.  HENT:  Head: Normocephalic.  Right Ear: External ear normal.  Left Ear: External ear normal.  Eyes: Pupils are equal, round, and reactive to light.  Neck: No tracheal deviation present. No thyromegaly present.  Cardiovascular: Normal rate.   Pulmonary/Chest: Effort normal.  Abdominal: Soft.  Musculoskeletal:  Patient has a brachial plexus tenderness worse on left than right. Increased pain with cervical compression some relief with distraction. Negative Lhermitte, positive Spurling on the left negative on the right. Triceps interossei are intact. No hyperthenar atrophy. Well-healed carpal tunnel incision on the right. She has tenderness over the cubital tunnel ulnar nerve subluxation.  Neurological: She  is alert and oriented to person, place, and time.  Skin: Skin is warm and dry.  Psychiatric: She has a normal mood and affect. Her behavior is normal.    Ortho Exam  Specialty Comments:  No specialty comments available.  Imaging: No results found.   PMFS History: Patient Active Problem List   Diagnosis Date Noted  . Upper GI bleed 10/03/2015  . Syncope 10/03/2015  . Acute blood loss anemia 10/03/2015  . Hypotension 10/03/2015  . Acute upper GI bleed 10/03/2015  . Hyperlipidemia   . Hypertension    . Lapband APS April 2010 by Dr. Adolphus Birchwood in [facility] 08/07/2012   Past Medical History:  Diagnosis Date  . History of bleeding ulcers   . Hyperlipidemia   . Hypertension     Family History  Problem Relation Age of Onset  . Cancer Mother     pancreatic  . Stroke Mother   . Diabetes Mother   . Hypertension Father   . Ulcers Father   . Hypertension Brother     Past Surgical History:  Procedure Laterality Date  . BILATERAL SALPINGECTOMY Bilateral 03/22/2013   Procedure: BILATERAL SALPINGECTOMY;  Surgeon: Serita Kyle, MD;  Location: [facility];  Service: Gynecology;  Laterality: Bilateral;  . CARPAL TUNNEL RELEASE Right 11/15/2013   Procedure: RIGHT CARPAL TUNNEL RELEASE;  Surgeon: Nicki Reaper, MD;  Location: [facility];  Service: Orthopedics;  Laterality: Right;  . COSMETIC SURGERY    . ESOPHAGOGASTRODUODENOSCOPY N/A 10/04/2015   Procedure: ESOPHAGOGASTRODUODENOSCOPY (EGD);  Surgeon: Ruffin Frederick, MD;  Location: [facility];  Service: Gastroenterology;  Laterality: N/A;  . GANGLION CYST EXCISION Right 11/15/2013   Procedure: EXCISION CYST RIGHT WRIST;  Surgeon: Nicki Reaper, MD;  Location: [facility];  Service: Orthopedics;  Laterality: Right;  . LAPAROSCOPIC GASTRIC BANDING    . LAPAROSCOPY N/A 03/22/2013   Procedure: LAPAROSCOPY OPERATIVE;  Surgeon: Serita Kyle, MD;  Location: [facility];  Service: Gynecology;  Laterality: N/A;  . SALPINGECTOMY  03/22/2013   Social History   Occupational History  . Not on file.   Social History Main Topics  . Smoking status: Never Smoker  . Smokeless tobacco: Never Used  . Alcohol use 0.6 oz/week    1 Standard drinks or equivalent per week     Comment: socially  . Drug use: No  . Sexual activity: Not on file

## 2017-03-04 ENCOUNTER — Telehealth (INDEPENDENT_AMBULATORY_CARE_PROVIDER_SITE_OTHER): Payer: Self-pay | Admitting: Orthopaedic Surgery

## 2017-03-04 NOTE — Telephone Encounter (Signed)
I called patient regarding the medical records release form she filled out and advised that the form isn't filled out correctly/she filled in form to authorize  Dr. Merlyn Lot to release records to her vs. Release records to [facility], and put her DOB where the date goes.  She will come by to complete a release form for [place] to obtain records from Dr. Merlyn Lot.

## 2017-03-08 ENCOUNTER — Ambulatory Visit
Admission: RE | Admit: 2017-03-08 | Discharge: 2017-03-08 | Disposition: A | Discharge status: Home / Self Care | Payer: Managed Care, Other (non HMO) | Source: Ambulatory Visit | Attending: Orthopaedic Surgery | Admitting: Orthopaedic Surgery

## 2017-03-08 DIAGNOSIS — M47812 Spondylosis without myelopathy or radiculopathy, cervical region: Secondary | ICD-10-CM

## 2017-03-10 ENCOUNTER — Telehealth (INDEPENDENT_AMBULATORY_CARE_PROVIDER_SITE_OTHER): Payer: Self-pay | Admitting: Orthopaedic Surgery

## 2017-03-10 NOTE — Telephone Encounter (Signed)
I faxed authorization to [facility]/Dr. Merrilee Seashore office

## 2017-03-25 ENCOUNTER — Encounter (INDEPENDENT_AMBULATORY_CARE_PROVIDER_SITE_OTHER): Payer: Self-pay | Admitting: Orthopaedic Surgery

## 2017-03-25 ENCOUNTER — Ambulatory Visit (INDEPENDENT_AMBULATORY_CARE_PROVIDER_SITE_OTHER): Payer: Managed Care, Other (non HMO) | Admitting: Orthopaedic Surgery

## 2017-03-25 DIAGNOSIS — M542 Cervicalgia: Secondary | ICD-10-CM

## 2017-03-25 NOTE — Progress Notes (Signed)
Office Visit Note   Patient: Susan [name]           Date of Birth: [birth year]           MRN: [id number] Visit Date: 03/25/2017              Requested by: Dorothyann Peng, MD [address] [place], [place] [address] PCP: [name], MD   Assessment & Plan: Visit Diagnoses:  1. Cervical pain     Plan: We discussed findings on MRI which are normal. She needs to find some activities for stress reduction such as yoga, working out regularly in exam or possibly taking the press walk with her children etc. She is juggling multiple rolls as parent, boss, business owner etc. She can return if she has any change in her symptoms. MRI scans were reviewed and we reviewed the report.  Follow-Up Instructions: Return if symptoms worsen or fail to improve.   Orders:  No orders of the defined types were placed in this encounter.  No orders of the defined types were placed in this encounter.     Procedures: No procedures performed   Clinical Data: No additional findings.   Subjective: Chief Complaint  Patient presents with  . Neck - Pain, Follow-up    HPI patient continues to have ongoing pain medicine on the left shoulder with some tightness. It radiates down her left arm. 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 She denies any lower extremity weakness associated. She has some increased symptoms when she's been exceptionally busy at work.  Review of Systems 14 part review of systems is updated and is unchanged from 02/26/2017 other than returning after her MRI scan cervical which was just completed.   Objective: Vital Signs: There were no vitals taken for this visit.  Physical Exam  Constitutional: She is oriented to person, place, and time. She appears well-developed.  HENT:  Head: Normocephalic.  Right Ear: External ear normal.  Left Ear: External ear normal.  Eyes:  Pupils are equal, round, and reactive to light.  Neck: No tracheal deviation present. No thyromegaly present.  Cardiovascular: Normal rate.   Pulmonary/Chest: Effort normal.  Abdominal: Soft.  Musculoskeletal:  Patient soreness so on the left side of her neck. No rash over his Proscan no lymphadenopathy thyroid is not enlarged, no exophthalmus. Normal heel toe gait.  Neurological: She is alert and oriented to person, place, and time.  Skin: Skin is warm and dry.  Psychiatric: She has a normal mood and affect. Her behavior is normal.    Ortho Exam  Specialty Comments:  No specialty comments available.  Imaging: Show images for MR Cervical Spine w/o contrast  Study Result   CLINICAL DATA:  Neck pain with numbness and weakness in the left arm.  EXAM: MRI CERVICAL SPINE WITHOUT CONTRAST  TECHNIQUE: Multiplanar, multisequence MR imaging of the cervical spine was performed. No intravenous contrast was administered.  COMPARISON:  Cervical spine radiograph 02/26/2017.  FINDINGS: Despite efforts by the technologist and patient, motion artifact is present on today's examination and could not be eliminated. The findings of this study are interpreted in the context of that limitation.  Alignment: Normal  Vertebrae: No acute compression fracture, discitis-osteomyelitis, facet edema or other focal marrow lesion. No epidural collection.  Cord: Normal caliber and signal  Posterior Fossa, vertebral arteries, paraspinal tissues: Visualized posterior fossa is normal. Vertebral  artery flow voids are preserved. Normal visualized paraspinal soft tissues.  Disc levels:  C1-C2: Normal.  C2-C3: Normal disc space and facets. No spinal canal or neuroforaminal stenosis.  C3-C4: Normal disc space and facets. No spinal canal or neuroforaminal stenosis.  C4-C5: Normal disc space and facets. No spinal canal or neuroforaminal stenosis.  C5-C6: Normal disc space and facets.  No spinal canal or neuroforaminal stenosis.  C6-C7: Normal disc space and facets. No spinal canal or neuroforaminal stenosis.  C7-T1: Normal disc space and facets. No spinal canal or neuroforaminal stenosis.  IMPRESSION: Normal appearance of the cervical spine.   Electronically Signed   By: Deatra Robinson M.D.   On: 03/08/2017 22:44       PMFS History: Patient Active Problem List   Diagnosis Date Noted  . Upper GI bleed 10/03/2015  . Syncope 10/03/2015  . Acute blood loss anemia 10/03/2015  . Hypotension 10/03/2015  . Acute upper GI bleed 10/03/2015  . Hyperlipidemia   . Hypertension   . Lapband APS April 2010 by Dr. Adolphus Birchwood in [facility] 08/07/2012   Past Medical History:  Diagnosis Date  . History of bleeding ulcers   . Hyperlipidemia   . Hypertension     Family History  Problem Relation Age of Onset  . Cancer Mother     pancreatic  . Stroke Mother   . Diabetes Mother   . Hypertension Father   . Ulcers Father   . Hypertension Brother     Past Surgical History:  Procedure Laterality Date  . BILATERAL SALPINGECTOMY Bilateral 03/22/2013   Procedure: BILATERAL SALPINGECTOMY;  Surgeon: Serita Kyle, MD;  Location: [facility];  Service: Gynecology;  Laterality: Bilateral;  . CARPAL TUNNEL RELEASE Right 11/15/2013   Procedure: RIGHT CARPAL TUNNEL RELEASE;  Surgeon: Nicki Reaper, MD;  Location: [facility];  Service: Orthopedics;  Laterality: Right;  . COSMETIC SURGERY    . ESOPHAGOGASTRODUODENOSCOPY N/A 10/04/2015   Procedure: ESOPHAGOGASTRODUODENOSCOPY (EGD);  Surgeon: Ruffin Frederick, MD;  Location: [facility];  Service: Gastroenterology;  Laterality: N/A;  . GANGLION CYST EXCISION Right 11/15/2013   Procedure: EXCISION CYST RIGHT WRIST;  Surgeon: Nicki Reaper, MD;  Location: [facility];  Service: Orthopedics;  Laterality: Right;  . LAPAROSCOPIC GASTRIC BANDING    . LAPAROSCOPY N/A 03/22/2013   Procedure:  LAPAROSCOPY OPERATIVE;  Surgeon: Serita Kyle, MD;  Location: [facility];  Service: Gynecology;  Laterality: N/A;  . SALPINGECTOMY  03/22/2013   Social History   Occupational History  . Not on file.   Social History Main Topics  . Smoking status: Never Smoker  . Smokeless tobacco: Never Used  . Alcohol use 0.6 oz/week    1 Standard drinks or equivalent per week     Comment: socially  . Drug use: No  . Sexual activity: Not on file

## 2017-05-12 ENCOUNTER — Other Ambulatory Visit (HOSPITAL_COMMUNITY): Payer: Self-pay | Admitting: Surgery

## 2017-05-15 ENCOUNTER — Encounter: Payer: Managed Care, Other (non HMO) | Attending: Surgery | Admitting: Skilled Nursing Facility1

## 2017-05-15 ENCOUNTER — Encounter: Payer: Self-pay | Admitting: Skilled Nursing Facility1

## 2017-05-15 DIAGNOSIS — Z713 Dietary counseling and surveillance: Secondary | ICD-10-CM | POA: Insufficient documentation

## 2017-05-15 DIAGNOSIS — E669 Obesity, unspecified: Secondary | ICD-10-CM

## 2017-05-15 DIAGNOSIS — Z6830 Body mass index (BMI) 30.0-30.9, adult: Secondary | ICD-10-CM | POA: Diagnosis not present

## 2017-05-15 NOTE — Progress Notes (Signed)
  Pre-Op Assessment Visit:  Pre-Operative Sleeve Surgery  Medical Nutrition Therapy:  Appt start time: 2:30 End time:  3:30  Patient was seen on 05/15/2017 for Pre-Operative Nutrition Assessment. Assessment and [facility] coordinator on 05/15/2017.   Pt states the band has provided restriction for 8 years kept her at a weight of about 135 pounds. Pt states she went to [employer] while had the band for further restriction of diet. Pt states she has had more frustrations at work that she does not know if they are pressures she is putting on herself or work is. Pt states she has started gaining weight seemingly out of nowhere in the last year and has had mood switches from raging to not stating her husband has noticed her mood changes. Pt states she has not been sleeping well. Pt states she does not know when her grandmother or mother went through menopause.  Pt was educated on her BMI and the possibility of insurance not covering. Pt states if the sleeve cannot give her long term restriction then she does not want it but states she needs something. Pt agrees she needs her band out due to the regurgitation.  Pt expectation of surgery: to keep the restriction of the surgery    Start weight at [facility]: 168.8 BMI: 30.87   24 hr Dietary Recall: First Meal: grits and muffin from fast food place Snack:  Second Meal: fast food Snack: chips Third Meal: skipped Snack:  Beverages: water, sprite, wine  Encouraged to engage in 150 minutes of moderate physical activity including cardiovascular and weight baring weekly  Handouts given during visit include:  . Pre-Op Goals . Bariatric Surgery Protein Shakes During the appointment today the following Pre-Op Goals were reviewed with the patient: . Maintain or lose weight as instructed by your surgeon . Make healthy food choices . Begin to limit portion sizes . Limited concentrated sugars  and fried foods . Keep fat/sugar in the single digits per serving on             food labels . Practice CHEWING your food  (aim for 30 chews per bite or until applesauce consistency) . Practice not drinking 15 minutes before, during, and 30 minutes after each meal/snack . Avoid all carbonated beverages  . Avoid/limit caffeinated beverages  . Avoid all sugar-sweetened beverages . Consume 3 meals per day; eat every 3-5 hours . Make a list of non-food related activities . Aim for 64-100 ounces of FLUID daily  . Aim for at least 60-80 grams of PROTEIN daily . Look for a liquid protein source that contain ?15 g protein and ?5 g carbohydrate  (ex: shakes, drinks, shots)  -Follow diet recommendations listed below   Energy and Macronutrient Recomendations: Calories: 1500 Carbohydrate: 170 Protein: 112 Fat: 42  Demonstrated degree of understanding via:  Teach Back  Teaching Method Utilized:  Visual Auditory Hands on  Barriers to learning/adherence to lifestyle change: none identified   Patient to call the [facility] to enroll in Pre-Op and Post-Op Nutrition Education when surgery date is scheduled.

## 2017-05-30 ENCOUNTER — Other Ambulatory Visit: Payer: Self-pay

## 2017-05-30 ENCOUNTER — Other Ambulatory Visit (HOSPITAL_COMMUNITY): Payer: Self-pay | Admitting: Surgery

## 2017-05-30 ENCOUNTER — Ambulatory Visit (HOSPITAL_COMMUNITY)
Admission: RE | Admit: 2017-05-30 | Discharge: 2017-05-30 | Disposition: A | Discharge status: Home / Self Care | Payer: Managed Care, Other (non HMO) | Source: Ambulatory Visit | Attending: Surgery | Admitting: Surgery

## 2017-05-30 DIAGNOSIS — Z01818 Encounter for other preprocedural examination: Secondary | ICD-10-CM | POA: Insufficient documentation

## 2017-05-30 DIAGNOSIS — Z9884 Bariatric surgery status: Secondary | ICD-10-CM | POA: Insufficient documentation

## 2017-06-16 ENCOUNTER — Encounter: Payer: Managed Care, Other (non HMO) | Attending: Surgery | Admitting: Skilled Nursing Facility1

## 2017-06-16 DIAGNOSIS — Z6830 Body mass index (BMI) 30.0-30.9, adult: Secondary | ICD-10-CM | POA: Diagnosis not present

## 2017-06-16 DIAGNOSIS — Z713 Dietary counseling and surveillance: Secondary | ICD-10-CM | POA: Diagnosis not present

## 2017-06-16 DIAGNOSIS — E669 Obesity, unspecified: Secondary | ICD-10-CM

## 2017-06-17 ENCOUNTER — Encounter: Payer: Self-pay | Admitting: Skilled Nursing Facility1

## 2017-06-17 NOTE — Progress Notes (Signed)
Pre-Operative Nutrition Class:  Appt start time: 1941   End time:  1830.  Patient was seen on 06/16/2017 for Pre-Operative Bariatric Surgery Education at the [facility].   Surgery date:  Surgery type: Sleeve Gastrectomy  [facility] weight at [facility]: 168.8 Weight today: 167.4  Samples given per MNT protocol. Patient educated on appropriate usage: Bariatric Advantage Calcium  Lot # [id number] Exp: dec-06-2017  Celebrate Vitamins Multivitamin Lot # [id number] Exp: 04/2018  Renee Pain Protein Shake Lot # [id number] Exp: nov-12-2016   The following the learning objectives were met by the patient during this course:  Identify Pre-Op Dietary Goals and will begin 2 weeks pre-operatively  Identify appropriate sources of fluids and proteins   State protein recommendations and appropriate sources pre and post-operatively  Identify Post-Operative Dietary Goals and will follow for 2 weeks post-operatively  Identify appropriate multivitamin and calcium sources  Describe the need for physical activity post-operatively and will follow MD recommendations  State when to call healthcare provider regarding medication questions or post-operative complications  Handouts given during class include:  Pre-Op Bariatric Surgery Diet Handout  Protein Shake Handout  Post-Op Bariatric Surgery Nutrition Handout  BELT Program Information Flyer  Support Group Information Flyer  [facility] Bariatric Supplements Price List  Follow-[facility] Plan: [facility] advancement per MD.

## 2017-07-10 ENCOUNTER — Ambulatory Visit: Payer: Managed Care, Other (non HMO) | Admitting: Psychiatry

## 2017-07-18 ENCOUNTER — Other Ambulatory Visit: Payer: Self-pay | Admitting: *Deleted

## 2017-07-18 NOTE — Patient Outreach (Addendum)
[facility]) Care Management  07/18/2017  Susan [name] [birth year] [id number]   Subjective: Telephone call to patient's home number, spoke with patient, and HIPAA verified.  Discussed [facility] Care Management [employer] Transition of care follow up, patient voiced understanding, and is in agreement to follow up.   Patient states her surgery was denied by Rosann Auerbach, attempting to seek approval through secondary insurance [address] [place], and may also file an appeal with [employer].   States Dr. Ermalene Searing office (MD through weight loss clinic) is working on obtaining approval.  Advised if approval is obtained through [place], then this RNCM will call back for post discharge call.   Patient voices understanding and is in agreement to receive transition of care call.   Objective: Per [employer] and chart review, patient was to be admitted to [facility] for morbid obesity.  No surgery date information in the chart.    Assessment: Received [employer] Transition of care referral on 07/16/16.  Transition of care follow up not completed due to surgery be denied by [employer], no pending surgery date at this time, and  will proceed with case closure.    Plan: RNCM will send case closure request due to patient being ineligible for services, surgery denied, to Iverson Alamin at [facility] Care Management.    Kaitlen Redford H. Gardiner Barefoot, BSN, CCM [facility] Care Management [facility] Telephonic CM Phone: [phone] Fax: [phone]

## 2017-09-16 ENCOUNTER — Encounter (HOSPITAL_COMMUNITY): Payer: Self-pay

## 2017-09-16 ENCOUNTER — Encounter (HOSPITAL_COMMUNITY)
Admission: RE | Admit: 2017-09-16 | Discharge: 2017-09-16 | Disposition: A | Discharge status: Home / Self Care | Payer: Managed Care, Other (non HMO) | Source: Ambulatory Visit | Attending: Surgery | Admitting: Surgery

## 2017-09-16 DIAGNOSIS — Z01812 Encounter for preprocedural laboratory examination: Secondary | ICD-10-CM | POA: Insufficient documentation

## 2017-09-16 HISTORY — DX: Headache: R51

## 2017-09-16 HISTORY — DX: Anemia, unspecified: D64.9

## 2017-09-16 HISTORY — DX: Prediabetes: R73.03

## 2017-09-16 HISTORY — DX: Gastro-esophageal reflux disease without esophagitis: K21.9

## 2017-09-16 HISTORY — DX: Headache, unspecified: R51.9

## 2017-09-16 HISTORY — DX: Unspecified osteoarthritis, unspecified site: M19.90

## 2017-09-16 LAB — HCG, SERUM, QUALITATIVE: Preg, Serum: NEGATIVE

## 2017-09-16 LAB — BASIC METABOLIC PANEL
Anion gap: 9 (ref 5–15)
BUN: 8 mg/dL (ref 6–20)
CHLORIDE: 100 mmol/L — AB (ref 101–111)
CO2: 29 mmol/L (ref 22–32)
CREATININE: 0.93 mg/dL (ref 0.44–1.00)
Calcium: 9.5 mg/dL (ref 8.9–10.3)
GFR calc non Af Amer: >60 mL/min (ref >60)
Glucose, Bld: 99 mg/dL (ref 65–99)
Potassium: 3.7 mmol/L (ref 3.5–5.1)
Sodium: 138 mmol/L (ref 135–145)

## 2017-09-16 LAB — CBC
HCT: 39 % (ref 36.0–46.0)
HEMOGLOBIN: 12.7 g/dL (ref 12.0–15.0)
MCH: 30 pg (ref 26.0–34.0)
MCHC: 32.6 g/dL (ref 30.0–36.0)
MCV: 92 fL (ref 78.0–100.0)
PLATELETS: 307 10*3/uL (ref 150–400)
RBC: 4.24 MIL/uL (ref 3.87–5.11)
RDW: 13.5 % (ref 11.5–15.5)
WBC: 6.3 10*3/uL (ref 4.0–10.5)

## 2017-09-16 NOTE — Progress Notes (Signed)
Please place orders in EPIC as patient is being scheduled for a pre-op appointment! Thank you! 

## 2017-09-16 NOTE — Patient Instructions (Signed)
Susan [name]  09/16/2017   Your procedure is scheduled on: Tuesday 09-23-17  Report to [facility]  Entrance [place]  elevators to 3rd floor to  Short Stay Center at 1200 PM   Call this number if you have problems the morning of surgery [phone]    Remember: ONLY 1 PERSON MAY GO WITH YOU TO SHORT STAY TO GET  READY MORNING OF YOUR SURGERY.  Do not eat food  :After Midnight.CLEAR LIQUIDS FROM MIDNIGHT UNTIL 800 AM DAY OF SURGERY  THEN NOTHING BY MOUTH AFTER 800 AM DAY OF SURGERY.      CLEAR LIQUID DIET   Foods Allowed                                                                     Foods Excluded  Coffee and tea, regular and decaf                             liquids that you cannot  Plain Jell-O in any flavor                                             see through such as: Fruit ices (not with fruit pulp)                                     milk, soups, orange juice  Iced Popsicles                                    All solid food Carbonated beverages, regular and diet                                    Cranberry, grape and apple juices Sports drinks like Gatorade Lightly seasoned clear broth or consume(fat free) Sugar, honey syrup  Sample Menu Breakfast                                Lunch                                     Supper Cranberry juice                    Beef broth                            Chicken broth Jell-O                                     Grape juice  Apple juice Coffee or tea                        Jell-O                                      Popsicle                                                Coffee or tea                        Coffee or tea  _____________________________________________________________________     Take these medicines the morning of surgery with A SIP OF WATER: NONE                               You may not have any metal on your body including hair pins and          piercings  Do not wear jewelry, make-up, lotions, powders or perfumes, deodorant             Do not wear nail polish.  Do not shave  48 hours prior to surgery.              Men may shave face and neck.   Do not bring valuables to the hospital. [place] IS NOT             RESPONSIBLE   FOR VALUABLES.  Contacts, dentures or bridgework may not be worn into surgery.  Leave suitcase in the car. After surgery it may be brought to your room.                Please read over the following fact sheets you were given: _____________________________________________________________________             [facility] - Preparing for Surgery Before surgery, you can play an important role.  Because skin is not sterile, your skin needs to be as free of germs as possible.  You can reduce the number of germs on your skin by washing with CHG (chlorahexidine gluconate) soap before surgery.  CHG is an antiseptic cleaner which kills germs and bonds with the skin to continue killing germs even after washing. Please DO NOT use if you have an allergy to CHG or antibacterial soaps.  If your skin becomes reddened/irritated stop using the CHG and inform your nurse when you arrive at Short Stay. Do not shave (including legs and underarms) for at least 48 hours prior to the first CHG shower.  You may shave your face/neck. Please follow these instructions carefully:  1.  Shower with CHG Soap the night before surgery and the  morning of Surgery.  2.  If you choose to wash your hair, wash your hair first as usual with your  normal  shampoo.  3.  After you shampoo, rinse your hair and body thoroughly to remove the  shampoo.                           4.  Use CHG as you would any other liquid soap.  You can apply chg directly  to the skin and wash                       Gently with a scrungie or clean washcloth.  5.  Apply the CHG Soap to your body ONLY FROM THE NECK DOWN.   Do not use on face/ open                            Wound or open sores. Avoid contact with eyes, ears mouth and genitals (private parts).                       Wash face,  Genitals (private parts) with your normal soap.             6.  Wash thoroughly, paying special attention to the area where your surgery  will be performed.  7.  Thoroughly rinse your body with warm water from the neck down.  8.  DO NOT shower/wash with your normal soap after using and rinsing off  the CHG Soap.                9.  Pat yourself dry with a clean towel.            10.  Wear clean pajamas.            11.  Place clean sheets on your bed the night of your first shower and do not  sleep with pets. Day of Surgery : Do not apply any lotions/deodorants the morning of surgery.  Please wear clean clothes to the hospital/surgery center.  FAILURE TO FOLLOW THESE INSTRUCTIONS MAY RESULT IN THE CANCELLATION OF YOUR SURGERY PATIENT SIGNATURE_________________________________  NURSE SIGNATURE__________________________________  ________________________________________________________________________

## 2017-09-16 NOTE — Progress Notes (Signed)
Patient instructed to call dr Melina Schoolsrobyn sanders and make aware blood pressure elevated at pre op 151/101 and 160/115 and call rn back with dr sanders instructions.

## 2017-09-17 LAB — HEMOGLOBIN A1C
Hgb A1c MFr Bld: 5.8 % — ABNORMAL HIGH (ref 4.8–5.6)
Mean Plasma Glucose: 120 mg/dL

## 2017-09-18 ENCOUNTER — Ambulatory Visit: Payer: Self-pay | Admitting: Surgery

## 2017-09-18 NOTE — H&P (Signed)
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  She transferred care to [facility] after her surgeon closed practice in [place] and moved to [place].  She has been followed with band fills and has been compliamt but despite this she has had weight regain and hasn't reached her goal.  She understands that sometimes we have to stage the lapband removal and sleeve gastrectomy but hopefully we can perform both.    Past Medical History:  Diagnosis Date  . Anemia 2016   with bleeding ulcer  . Arthritis    oa hands  . GERD (gastroesophageal reflux disease)    no current meds  . Headache    headaches and migraines  . History of bleeding ulcers   . Hyperlipidemia    no cholesterol meds taken  . Hypertension   . Pre-diabetes     Past Surgical History:  Procedure Laterality Date  . BILATERAL SALPINGECTOMY Bilateral 03/22/2013   Procedure: BILATERAL SALPINGECTOMY;  Surgeon: Sheronette A Cousins, MD;  Location: [facility];  Service: Gynecology;  Laterality: Bilateral;  . CARPAL TUNNEL RELEASE Right 11/15/2013   Procedure: RIGHT CARPAL TUNNEL RELEASE;  Surgeon: Gary R Kuzma, MD;  Location: [facility];  Service: Orthopedics;  Laterality: Right;  . COSMETIC SURGERY  01/2015   tummy tuck   . ESOPHAGOGASTRODUODENOSCOPY N/A 10/04/2015   Procedure: ESOPHAGOGASTRODUODENOSCOPY (EGD);  Surgeon: Steven Paul Armbruster, MD;  Location: [facility];  Service: Gastroenterology;  Laterality: N/A;  . GANGLION CYST EXCISION Right 11/15/2013   Procedure: EXCISION CYST RIGHT WRIST;  Surgeon: Gary R Kuzma, MD;  Location: [facility];  Service: Orthopedics;  Laterality: Right;  . LAPAROSCOPIC GASTRIC BANDING  2010  . LAPAROSCOPY N/A 03/22/2013   Procedure: LAPAROSCOPY OPERATIVE;  Surgeon: Sheronette A Cousins, MD;  Location: [facility];  Service:  Gynecology;  Laterality: N/A;  . MOUTH SURGERY  1991  . SALPINGECTOMY  03/22/2013    Current Outpatient Prescriptions  Medication Sig Dispense Refill  . acetaminophen (TYLENOL) 325 MG tablet Take 650 mg by mouth every 6 (six) hours as needed.    . Aspirin-Salicylamide-Caffeine (BC HEADACHE POWDER PO) Take 1 packet by mouth daily as needed (headaches).    . Chlorphen-Phenyleph-ASA (ALKA-SELTZER PLUS COLD PO) Take 2 tablets by mouth at bedtime as needed (cold symptoms).    . doxylamine, Sleep, (UNISOM) 25 MG tablet Take 25 mg by mouth at bedtime as needed for sleep.    . levonorgestrel (MIRENA, 52 MG,) 20 MCG/24HR IUD 1 each by Intrauterine route once. Inserted 2017    . telmisartan-hydrochlorothiazide (MICARDIS HCT) 40-12.5 MG tablet Take 1 tablet by mouth daily.     Current Facility-Administered Medications  Medication Dose Route Frequency Provider Last Rate Last Dose  . 0.9 %  sodium chloride infusion  500 mL Intravenous Continuous Armbruster, Steven P, MD       Patient has no known allergies. Family History  Problem Relation Age of Onset  . Cancer Mother        pancreatic  . Stroke Mother   . Diabetes Mother   . Hypertension Father   . Ulcers Father   . Hypertension Brother    Social History:   reports that she has never smoked. She has never used smokeless tobacco. She reports that she drinks about 0.6 oz of alcohol per week . She   reports that she does not use drugs.   REVIEW OF SYSTEMS : Negative except for see problem list3  Physical Exam:   There were no vitals taken for this visit. There is no height or weight on file to calculate BMI.  Gen:  WDWN AAF NAD  Neurological: Alert and oriented to person, place, and time. Motor and sensory function is grossly intact  Head: Normocephalic and atraumatic.  Eyes: Conjunctivae are normal. Pupils are equal, round, and reactive to light. No scleral icterus.  Neck: Normal range of motion. Neck supple. No tracheal deviation or  thyromegaly present.  Cardiovascular:  SR without murmurs or gallops.  No carotid bruits Breast:  Not examined Respiratory: Effort normal.  No respiratory distress. No chest wall tenderness. Breath sounds normal.  No wheezes, rales or rhonchi.  Abdomen:  Port in left upper quadrant GU:  Not examined Musculoskeletal: Normal range of motion. Extremities are nontender. No cyanosis, edema or clubbing noted Lymphadenopathy: No cervical, preauricular, postauricular or axillary adenopathy is present Skin: Skin is warm and dry. No rash noted. No diaphoresis. No erythema. No pallor. Pscyh: Normal mood and affect. Behavior is normal. Judgment and thought content normal.   LABORATORY RESULTS: Results for orders placed or performed during the hospital encounter of 09/16/17 (from the past 48 hour(s))  hCG, serum, qualitative     Status: None   Collection Time: 09/16/17  1:48 PM  Result Value Ref Range   Preg, Serum NEGATIVE NEGATIVE    Comment:        THE SENSITIVITY OF THIS METHODOLOGY IS >10 mIU/mL.   Basic metabolic panel     Status: Abnormal   Collection Time: 09/16/17  1:48 PM  Result Value Ref Range   Sodium 138 135 - 145 mmol/L   Potassium 3.7 3.5 - 5.1 mmol/L   Chloride 100 (L) 101 - 111 mmol/L   CO2 29 22 - 32 mmol/L   Glucose, Bld 99 65 - 99 mg/dL   BUN 8 6 - 20 mg/dL   Creatinine, Ser 0.93 0.44 - 1.00 mg/dL   Calcium 9.5 8.9 - 10.3 mg/dL   GFR calc non Af Amer >60 >60 mL/min   GFR calc Af Amer >60 >60 mL/min    Comment: (NOTE) The eGFR has been calculated using the CKD EPI equation. This calculation has not been validated in all clinical situations. eGFR's persistently <60 mL/min signify possible Chronic Kidney Disease.    Anion gap 9 5 - 15  Hemoglobin A1c     Status: Abnormal   Collection Time: 09/16/17  1:48 PM  Result Value Ref Range   Hgb A1c MFr Bld 5.8 (H) 4.8 - 5.6 %    Comment: (NOTE)         Prediabetes: 5.7 - 6.4         Diabetes: >6.4         Glycemic control  for adults with diabetes: <7.0    Mean Plasma Glucose 120 mg/dL    Comment: (NOTE) Performed At: [facility] [place] [address] , [place] [id number] [place] William F MD Ph:[phone]   CBC     Status: None   Collection Time: 09/16/17  1:48 PM  Result Value Ref Range   WBC 6.3 4.0 - 10.5 K/uL   RBC 4.24 3.87 - 5.11 MIL/uL   Hemoglobin 12.7 12.0 - 15.0 g/dL   HCT 39.0 36.0 - 46.0 %   MCV 92.0 78.0 - 100.0 fL   MCH 30.0 26.0 - 34.0 pg     MCHC 32.6 30.0 - 36.0 g/dL   RDW 13.5 11.5 - 15.5 %   Platelets 307 150 - 400 K/uL     RADIOLOGY RESULTS: No results found.  Problem List: Patient Active Problem List   Diagnosis Date Noted  . Upper GI bleed 10/03/2015  . Syncope 10/03/2015  . Acute blood loss anemia 10/03/2015  . Hypotension 10/03/2015  . Acute upper GI bleed 10/03/2015  . Hyperlipidemia   . Hypertension   . Lapband APS April 2010 by Dr. Evorn Gong in [facility] 08/07/2012    Assessment & Plan: For lapband removal and conversion to sleeve gastrectomy    Matt B. Hassell Done, MD, [facility] [phone] beeper [phone]  09/18/2017 12:20 PM

## 2017-09-18 NOTE — Progress Notes (Signed)
Spoke with patient by phone, patient stated blood pressure at dr Daphine Deutschermartin office today was 130/30 and she has been taking blood pressure medication daily now. Patient will take blood pressure medication as late as possible day before surgery.

## 2017-09-19 ENCOUNTER — Other Ambulatory Visit (HOSPITAL_COMMUNITY): Payer: Self-pay | Admitting: *Deleted

## 2017-09-23 ENCOUNTER — Inpatient Hospital Stay (HOSPITAL_COMMUNITY)
Admission: RE | Admit: 2017-09-23 | Discharge: 2017-09-24 | DRG: 621 | Disposition: A | Discharge status: Home / Self Care | Payer: Managed Care, Other (non HMO) | Source: Ambulatory Visit | Attending: Surgery | Admitting: Surgery

## 2017-09-23 ENCOUNTER — Inpatient Hospital Stay (HOSPITAL_COMMUNITY): Payer: Managed Care, Other (non HMO) | Admitting: Anesthesiology

## 2017-09-23 ENCOUNTER — Encounter (HOSPITAL_COMMUNITY)
Admission: RE | Disposition: A | Discharge status: Home / Self Care | Payer: Self-pay | Source: Ambulatory Visit | Attending: Surgery

## 2017-09-23 ENCOUNTER — Encounter (HOSPITAL_COMMUNITY): Payer: Self-pay | Admitting: *Deleted

## 2017-09-23 DIAGNOSIS — Z9884 Bariatric surgery status: Secondary | ICD-10-CM | POA: Diagnosis not present

## 2017-09-23 DIAGNOSIS — Z6831 Body mass index (BMI) 31.0-31.9, adult: Secondary | ICD-10-CM | POA: Diagnosis not present

## 2017-09-23 DIAGNOSIS — Z7982 Long term (current) use of aspirin: Secondary | ICD-10-CM | POA: Diagnosis not present

## 2017-09-23 DIAGNOSIS — Z8249 Family history of ischemic heart disease and other diseases of the circulatory system: Secondary | ICD-10-CM

## 2017-09-23 DIAGNOSIS — K219 Gastro-esophageal reflux disease without esophagitis: Secondary | ICD-10-CM | POA: Diagnosis present

## 2017-09-23 DIAGNOSIS — Z79899 Other long term (current) drug therapy: Secondary | ICD-10-CM | POA: Diagnosis not present

## 2017-09-23 DIAGNOSIS — I10 Essential (primary) hypertension: Secondary | ICD-10-CM | POA: Diagnosis present

## 2017-09-23 HISTORY — PX: LAPAROSCOPIC GASTRIC BAND REMOVAL WITH LAPAROSCOPIC GASTRIC SLEEVE RESECTION: SHX6498

## 2017-09-23 LAB — HEPATIC FUNCTION PANEL
ALK PHOS: 71 U/L (ref 38–126)
ALT: 14 U/L (ref 14–54)
AST: 22 U/L (ref 15–41)
Albumin: 4.4 g/dL (ref 3.5–5.0)
Total Bilirubin: 1.3 mg/dL — ABNORMAL HIGH (ref 0.3–1.2)
Total Protein: 8.2 g/dL — ABNORMAL HIGH (ref 6.5–8.1)

## 2017-09-23 LAB — CBC WITH DIFFERENTIAL/PLATELET
Basophils Absolute: 0.1 10*3/uL (ref 0.0–0.1)
Basophils Relative: 1 %
EOS PCT: 4 %
Eosinophils Absolute: 0.2 10*3/uL (ref 0.0–0.7)
HCT: 41.2 % (ref 36.0–46.0)
HEMOGLOBIN: 13.7 g/dL (ref 12.0–15.0)
LYMPHS ABS: 2.8 10*3/uL (ref 0.7–4.0)
LYMPHS PCT: 46 %
MCH: 30.4 pg (ref 26.0–34.0)
MCHC: 33.3 g/dL (ref 30.0–36.0)
MCV: 91.6 fL (ref 78.0–100.0)
Monocytes Absolute: 0.3 10*3/uL (ref 0.1–1.0)
Monocytes Relative: 5 %
NEUTROS ABS: 2.7 10*3/uL (ref 1.7–7.7)
NEUTROS PCT: 44 %
PLATELETS: 402 10*3/uL — AB (ref 150–400)
RBC: 4.5 MIL/uL (ref 3.87–5.11)
RDW: 13.3 % (ref 11.5–15.5)
WBC: 6 10*3/uL (ref 4.0–10.5)

## 2017-09-23 LAB — HEMOGLOBIN AND HEMATOCRIT, BLOOD
HEMATOCRIT: 37.3 % (ref 36.0–46.0)
HEMOGLOBIN: 12.2 g/dL (ref 12.0–15.0)

## 2017-09-23 SURGERY — LAPAROSCOPIC GASTRIC BAND REMOVAL WITH LAPAROSCOPIC GASTRIC SLEEVE RESECTION
Anesthesia: General

## 2017-09-23 MED ORDER — LIDOCAINE 2% (20 MG/ML) 5 ML SYRINGE
INTRAMUSCULAR | Status: AC
  Filled 2017-09-23: qty 10

## 2017-09-23 MED ORDER — HYDROMORPHONE HCL 1 MG/ML IJ SOLN: 0.2500 mg | INTRAMUSCULAR | Status: DC | PRN

## 2017-09-23 MED ORDER — SCOPOLAMINE 1 MG/3DAYS TD PT72
1.0000 | MEDICATED_PATCH | TRANSDERMAL | Status: DC
  Administered 2017-09-23: 1.5 mg via TRANSDERMAL
  Filled 2017-09-23: qty 1

## 2017-09-23 MED ORDER — ONDANSETRON HCL 4 MG/2ML IJ SOLN
INTRAMUSCULAR | Status: AC
  Filled 2017-09-23: qty 2

## 2017-09-23 MED ORDER — ROCURONIUM BROMIDE 50 MG/5ML IV SOSY
PREFILLED_SYRINGE | INTRAVENOUS | Status: AC
  Filled 2017-09-23: qty 5

## 2017-09-23 MED ORDER — PHENYLEPHRINE HCL 10 MG/ML IJ SOLN
INTRAVENOUS | Status: DC | PRN
  Administered 2017-09-23: 40 ug/min via INTRAVENOUS

## 2017-09-23 MED ORDER — PROMETHAZINE HCL 25 MG/ML IJ SOLN
INTRAMUSCULAR | Status: AC
  Filled 2017-09-23: qty 1

## 2017-09-23 MED ORDER — ACETAMINOPHEN 500 MG PO TABS
1000.0000 mg | ORAL_TABLET | ORAL | Status: AC
  Administered 2017-09-23: 1000 mg via ORAL
  Filled 2017-09-23: qty 2

## 2017-09-23 MED ORDER — ROCURONIUM BROMIDE 10 MG/ML (PF) SYRINGE
PREFILLED_SYRINGE | INTRAVENOUS | Status: DC | PRN
  Administered 2017-09-23: 50 mg via INTRAVENOUS
  Administered 2017-09-23: 10 mg via INTRAVENOUS

## 2017-09-23 MED ORDER — CHLORHEXIDINE GLUCONATE CLOTH 2 % EX PADS
6.0000 | MEDICATED_PAD | Freq: Once | CUTANEOUS | Status: AC
  Administered 2017-09-23: 6 via TOPICAL

## 2017-09-23 MED ORDER — PHENYLEPHRINE HCL 10 MG/ML IJ SOLN
INTRAMUSCULAR | Status: AC
  Filled 2017-09-23: qty 1

## 2017-09-23 MED ORDER — APREPITANT 40 MG PO CAPS
40.0000 mg | ORAL_CAPSULE | ORAL | Status: AC
  Administered 2017-09-23: 40 mg via ORAL
  Filled 2017-09-23: qty 1

## 2017-09-23 MED ORDER — DEXAMETHASONE SODIUM PHOSPHATE 4 MG/ML IJ SOLN
4.0000 mg | INTRAMUSCULAR | Status: AC
  Administered 2017-09-23: 4 mg via INTRAVENOUS

## 2017-09-23 MED ORDER — LACTATED RINGERS IV SOLN
INTRAVENOUS | Status: DC
  Administered 2017-09-23 (×2): via INTRAVENOUS

## 2017-09-23 MED ORDER — 0.9 % SODIUM CHLORIDE (POUR BTL) OPTIME
TOPICAL | Status: DC | PRN
  Administered 2017-09-23: 1000 mL

## 2017-09-23 MED ORDER — LIDOCAINE 2% (20 MG/ML) 5 ML SYRINGE
INTRAMUSCULAR | Status: DC | PRN
Start: 2017-09-23 — End: 2017-09-23
  Administered 2017-09-23: 50 mg via INTRAVENOUS

## 2017-09-23 MED ORDER — LIP MEDEX EX OINT
TOPICAL_OINTMENT | CUTANEOUS | Status: AC
  Filled 2017-09-23: qty 7

## 2017-09-23 MED ORDER — PROPOFOL 10 MG/ML IV BOLUS
INTRAVENOUS | Status: DC | PRN
  Administered 2017-09-23: 150 mg via INTRAVENOUS

## 2017-09-23 MED ORDER — CEFOTETAN DISODIUM-DEXTROSE 2-2.08 GM-%(50ML) IV SOLR
2.0000 g | INTRAVENOUS | Status: AC
Start: 2017-09-23 — End: 2017-09-23
  Administered 2017-09-23: 2 g via INTRAVENOUS
  Filled 2017-09-23: qty 50

## 2017-09-23 MED ORDER — SUGAMMADEX SODIUM 200 MG/2ML IV SOLN
INTRAVENOUS | Status: AC
  Filled 2017-09-23: qty 2

## 2017-09-23 MED ORDER — DEXAMETHASONE SODIUM PHOSPHATE 10 MG/ML IJ SOLN
INTRAMUSCULAR | Status: AC
  Filled 2017-09-23: qty 1

## 2017-09-23 MED ORDER — OXYCODONE HCL 5 MG/5ML PO SOLN
5.0000 mg | ORAL | Status: DC | PRN
  Administered 2017-09-24 (×2): 5 mg via ORAL
  Filled 2017-09-23 (×3): qty 5

## 2017-09-23 MED ORDER — GABAPENTIN 300 MG PO CAPS
300.0000 mg | ORAL_CAPSULE | ORAL | Status: AC
Start: 2017-09-23 — End: 2017-09-23
  Administered 2017-09-23: 300 mg via ORAL
  Filled 2017-09-23: qty 1

## 2017-09-23 MED ORDER — BUPIVACAINE LIPOSOME 1.3 % IJ SUSP
20.0000 mL | Freq: Once | INTRAMUSCULAR | Status: AC
Start: 2017-09-23 — End: 2017-09-23
  Administered 2017-09-23: 20 mL
  Filled 2017-09-23: qty 20

## 2017-09-23 MED ORDER — HEPARIN SODIUM (PORCINE) 5000 UNIT/ML IJ SOLN
5000.0000 [IU] | Freq: Three times a day (TID) | INTRAMUSCULAR | Status: DC
  Administered 2017-09-23 – 2017-09-24 (×2): 5000 [IU] via SUBCUTANEOUS
  Filled 2017-09-23 (×2): qty 1

## 2017-09-23 MED ORDER — SODIUM CHLORIDE 0.9 % IJ SOLN
INTRAMUSCULAR | Status: AC
  Filled 2017-09-23: qty 10

## 2017-09-23 MED ORDER — LIDOCAINE 2% (20 MG/ML) 5 ML SYRINGE
INTRAMUSCULAR | Status: DC | PRN
  Administered 2017-09-23: 1.5 mg/kg/h via INTRAVENOUS

## 2017-09-23 MED ORDER — ACETAMINOPHEN 160 MG/5ML PO SOLN
650.0000 mg | ORAL | Status: DC | PRN
Start: 2017-09-23 — End: 2017-09-24

## 2017-09-23 MED ORDER — SODIUM CHLORIDE 0.9 % IJ SOLN
INTRAMUSCULAR | Status: DC | PRN
  Administered 2017-09-23: 10 mL via INTRAVENOUS

## 2017-09-23 MED ORDER — PHENYLEPHRINE 40 MCG/ML (10ML) SYRINGE FOR IV PUSH (FOR BLOOD PRESSURE SUPPORT)
PREFILLED_SYRINGE | INTRAVENOUS | Status: AC
  Filled 2017-09-23: qty 10

## 2017-09-23 MED ORDER — PANTOPRAZOLE SODIUM 40 MG IV SOLR
40.0000 mg | Freq: Every day | INTRAVENOUS | Status: DC
  Administered 2017-09-23: 40 mg via INTRAVENOUS
  Filled 2017-09-23: qty 40

## 2017-09-23 MED ORDER — KCL IN DEXTROSE-NACL 20-5-0.45 MEQ/L-%-% IV SOLN
INTRAVENOUS | Status: DC
  Administered 2017-09-23 – 2017-09-24 (×2): 1000 mL via INTRAVENOUS
  Filled 2017-09-23 (×2): qty 1000

## 2017-09-23 MED ORDER — LACTATED RINGERS IR SOLN
Status: DC | PRN
  Administered 2017-09-23: 1000 mL

## 2017-09-23 MED ORDER — MORPHINE SULFATE (PF) 2 MG/ML IV SOLN
1.0000 mg | INTRAVENOUS | Status: DC | PRN
  Administered 2017-09-23 – 2017-09-24 (×2): 2 mg via INTRAVENOUS
  Filled 2017-09-23 (×2): qty 1

## 2017-09-23 MED ORDER — CELECOXIB 200 MG PO CAPS
400.0000 mg | ORAL_CAPSULE | ORAL | Status: AC
  Administered 2017-09-23: 400 mg via ORAL
  Filled 2017-09-23: qty 2

## 2017-09-23 MED ORDER — PREMIER PROTEIN SHAKE: 2.0000 [oz_av] | ORAL | Status: DC

## 2017-09-23 MED ORDER — MIDAZOLAM HCL 2 MG/2ML IJ SOLN
INTRAMUSCULAR | Status: AC
  Filled 2017-09-23: qty 2

## 2017-09-23 MED ORDER — MIDAZOLAM HCL 5 MG/5ML IJ SOLN
INTRAMUSCULAR | Status: DC | PRN
  Administered 2017-09-23: 2 mg via INTRAVENOUS

## 2017-09-23 MED ORDER — KETOROLAC TROMETHAMINE 30 MG/ML IJ SOLN
INTRAMUSCULAR | Status: AC
  Filled 2017-09-23: qty 1

## 2017-09-23 MED ORDER — FENTANYL CITRATE (PF) 100 MCG/2ML IJ SOLN
INTRAMUSCULAR | Status: DC | PRN
  Administered 2017-09-23 (×2): 50 ug via INTRAVENOUS
  Administered 2017-09-23: 100 ug via INTRAVENOUS
  Administered 2017-09-23: 50 ug via INTRAVENOUS

## 2017-09-23 MED ORDER — ONDANSETRON HCL 4 MG/2ML IJ SOLN: 4.0000 mg | INTRAMUSCULAR | Status: DC | PRN

## 2017-09-23 MED ORDER — KETOROLAC TROMETHAMINE 30 MG/ML IJ SOLN: 30.0000 mg | Freq: Once | INTRAMUSCULAR | Status: DC | PRN

## 2017-09-23 MED ORDER — FENTANYL CITRATE (PF) 250 MCG/5ML IJ SOLN
INTRAMUSCULAR | Status: AC
  Filled 2017-09-23: qty 5

## 2017-09-23 MED ORDER — PHENYLEPHRINE 40 MCG/ML (10ML) SYRINGE FOR IV PUSH (FOR BLOOD PRESSURE SUPPORT)
PREFILLED_SYRINGE | INTRAVENOUS | Status: DC | PRN
  Administered 2017-09-23 (×2): 80 ug via INTRAVENOUS

## 2017-09-23 MED ORDER — SUGAMMADEX SODIUM 200 MG/2ML IV SOLN
INTRAVENOUS | Status: DC | PRN
  Administered 2017-09-23: 160 mg via INTRAVENOUS

## 2017-09-23 MED ORDER — PROMETHAZINE HCL 25 MG/ML IJ SOLN: 6.2500 mg | INTRAMUSCULAR | Status: DC | PRN

## 2017-09-23 MED ORDER — HEPARIN SODIUM (PORCINE) 5000 UNIT/ML IJ SOLN
5000.0000 [IU] | INTRAMUSCULAR | Status: AC
  Administered 2017-09-23: 5000 [IU] via SUBCUTANEOUS
  Filled 2017-09-23: qty 1

## 2017-09-23 MED ORDER — ONDANSETRON HCL 4 MG/2ML IJ SOLN
INTRAMUSCULAR | Status: DC | PRN
  Administered 2017-09-23: 4 mg via INTRAVENOUS

## 2017-09-23 MED ORDER — HYDROMORPHONE HCL 1 MG/ML IJ SOLN
INTRAMUSCULAR | Status: AC
  Filled 2017-09-23: qty 2

## 2017-09-23 MED ORDER — INFLUENZA VAC SPLIT QUAD 0.5 ML IM SUSY: 0.5000 mL | PREFILLED_SYRINGE | INTRAMUSCULAR | Status: DC

## 2017-09-23 MED ORDER — PROPOFOL 10 MG/ML IV BOLUS
INTRAVENOUS | Status: AC
  Filled 2017-09-23: qty 20

## 2017-09-23 SURGICAL SUPPLY — 66 items
ADH SKN CLS APL DERMABOND .7 (GAUZE/BANDAGES/DRESSINGS) ×1
APPLICATOR COTTON TIP 6IN STRL (MISCELLANEOUS) IMPLANT
APPLIER CLIP ROT 10 11.4 M/L (STAPLE)
APPLIER CLIP ROT 13.4 12 LRG (CLIP)
APR CLP LRG 13.4X12 ROT 20 MLT (CLIP)
APR CLP MED LRG 11.4X10 (STAPLE)
BAG SPEC RTRVL LRG 6X4 10 (ENDOMECHANICALS)
BLADE SURG 15 STRL LF DISP TIS (BLADE) ×1 IMPLANT
BLADE SURG 15 STRL SS (BLADE) ×3
CABLE HIGH FREQUENCY MONO STRZ (ELECTRODE) IMPLANT
CLIP APPLIE ROT 10 11.4 M/L (STAPLE) IMPLANT
CLIP APPLIE ROT 13.4 12 LRG (CLIP) IMPLANT
DERMABOND ADVANCED (GAUZE/BANDAGES/DRESSINGS) ×2
DERMABOND ADVANCED .7 DNX12 (GAUZE/BANDAGES/DRESSINGS) IMPLANT
DEVICE SUT QUICK LOAD TK 5 (STAPLE) IMPLANT
DEVICE SUT TI-KNOT TK 5X26 (MISCELLANEOUS) IMPLANT
DEVICE SUTURE ENDOST 10MM (ENDOMECHANICALS) IMPLANT
DEVICE TI KNOT TK5 (MISCELLANEOUS)
DEVICE TROCAR PUNCTURE CLOSURE (ENDOMECHANICALS) IMPLANT
ELECT PENCIL ROCKER SW 15FT (MISCELLANEOUS) ×3 IMPLANT
ELECT REM PT RETURN 15FT ADLT (MISCELLANEOUS) ×3 IMPLANT
EVACUATOR SILICONE 100CC (DRAIN) IMPLANT
GAUZE SPONGE 4X4 12PLY STRL (GAUZE/BANDAGES/DRESSINGS) IMPLANT
GLOVE BIOGEL M 8.0 STRL (GLOVE) ×3 IMPLANT
GOWN STRL REUS W/TWL XL LVL3 (GOWN DISPOSABLE) ×12 IMPLANT
HANDLE STAPLE EGIA 4 XL (STAPLE) ×3 IMPLANT
HOVERMATT SINGLE USE (MISCELLANEOUS) ×3 IMPLANT
KIT BASIN OR (CUSTOM PROCEDURE TRAY) ×3 IMPLANT
MARKER SKIN DUAL TIP RULER LAB (MISCELLANEOUS) ×3 IMPLANT
NDL SPNL 22GX3.5 QUINCKE BK (NEEDLE) ×1 IMPLANT
NEEDLE SPNL 22GX3.5 QUINCKE BK (NEEDLE) ×3 IMPLANT
PACK UNIVERSAL I (CUSTOM PROCEDURE TRAY) ×3 IMPLANT
POUCH SPECIMEN RETRIEVAL 10MM (ENDOMECHANICALS) IMPLANT
QUICK LOAD TK 5 (STAPLE)
RELOAD STAPLE 45 PURP MED/THCK (STAPLE) IMPLANT
RELOAD TRI 45 ART MED THCK BLK (STAPLE) ×9 IMPLANT
RELOAD TRI 45 ART MED THCK PUR (STAPLE) IMPLANT
RELOAD TRI 60 ART MED THCK BLK (STAPLE) ×3 IMPLANT
RELOAD TRI 60 ART MED THCK PUR (STAPLE) IMPLANT
SCISSORS LAP 5X45 EPIX DISP (ENDOMECHANICALS) IMPLANT
SCRUB TECHNI CARE 4 OZ NO DYE (MISCELLANEOUS) ×6 IMPLANT
SET BI-LUMEN FLTR TB AIRSEAL (TUBING) ×2 IMPLANT
SET IRRIG TUBING LAPAROSCOPIC (IRRIGATION / IRRIGATOR) ×3 IMPLANT
SHEARS HARMONIC ACE PLUS 45CM (MISCELLANEOUS) ×3 IMPLANT
SLEEVE ADV FIXATION 12X100MM (TROCAR) IMPLANT
SLEEVE ADV FIXATION 5X100MM (TROCAR) ×6 IMPLANT
SLEEVE GASTRECTOMY 36FR VISIGI (MISCELLANEOUS) ×3 IMPLANT
SOLUTION ANTI FOG 6CC (MISCELLANEOUS) ×3 IMPLANT
SPONGE LAP 18X18 X RAY DECT (DISPOSABLE) ×5 IMPLANT
STAPLER VISISTAT 35W (STAPLE) ×3 IMPLANT
SUT MNCRL AB 4-0 PS2 18 (SUTURE) ×4 IMPLANT
SUT SURGIDAC NAB ES-9 0 48 120 (SUTURE) IMPLANT
SUT VIC AB 4-0 SH 18 (SUTURE) ×3 IMPLANT
SUT VICRYL 0 TIES 12 18 (SUTURE) ×3 IMPLANT
SYR 20CC LL (SYRINGE) ×3 IMPLANT
SYR 50ML LL SCALE MARK (SYRINGE) ×3 IMPLANT
TOWEL OR 17X26 10 PK STRL BLUE (TOWEL DISPOSABLE) ×6 IMPLANT
TOWEL OR NON WOVEN STRL DISP B (DISPOSABLE) ×3 IMPLANT
TRAY FOLEY W/METER SILVER 16FR (SET/KITS/TRAYS/PACK) ×3 IMPLANT
TROCAR ADV FIXATION 12X100MM (TROCAR) ×3 IMPLANT
TROCAR ADV FIXATION 5X100MM (TROCAR) ×3 IMPLANT
TROCAR BLADELESS 15MM (ENDOMECHANICALS) ×3 IMPLANT
TROCAR BLADELESS OPT 5 100 (ENDOMECHANICALS) ×3 IMPLANT
TUBING CONNECTING 10 (TUBING) ×2 IMPLANT
TUBING CONNECTING 10' (TUBING) ×1
TUBING ENDO SMARTCAP (MISCELLANEOUS) ×3 IMPLANT

## 2017-09-23 NOTE — Anesthesia Preprocedure Evaluation (Signed)
Anesthesia Evaluation  Patient identified by MRN, date of birth, ID band Patient awake    Reviewed: Allergy & Precautions, NPO status , Patient's Chart, lab work & pertinent test results  Airway Mallampati: II  TM Distance: >3 FB Neck ROM: Full    Dental no notable dental hx.    Pulmonary neg pulmonary ROS,    Pulmonary exam normal breath sounds clear to auscultation       Cardiovascular hypertension, Normal cardiovascular exam Rhythm:Regular Rate:Normal     Neuro/Psych negative neurological ROS  negative psych ROS   GI/Hepatic negative GI ROS, Neg liver ROS,   Endo/Other  obesity  Renal/GU negative Renal ROS  negative genitourinary   Musculoskeletal negative musculoskeletal ROS (+)   Abdominal   Peds negative pediatric ROS (+)  Hematology negative hematology ROS (+)   Anesthesia Other Findings   Reproductive/Obstetrics negative OB ROS                             Anesthesia Physical Anesthesia Plan  ASA: II  Anesthesia Plan: General   Post-op Pain Management:    Induction: Intravenous  PONV Risk Score and Plan: 3 and Ondansetron, Dexamethasone, Midazolam and Scopolamine patch - Pre-op  Airway Management Planned: Oral ETT  Additional Equipment:   Intra-op Plan:   Post-operative Plan: Extubation in OR  Informed Consent: I have reviewed the patients History and Physical, chart, labs and discussed the procedure including the risks, benefits and alternatives for the proposed anesthesia with the patient or authorized representative who has indicated his/her understanding and acceptance.   Dental advisory given  Plan Discussed with: CRNA and Surgeon  Anesthesia Plan Comments:         Anesthesia Quick Evaluation

## 2017-09-23 NOTE — Discharge Instructions (Signed)

## 2017-09-23 NOTE — H&P (View-Only) (Signed)
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  She transferred care to [place] after her surgeon closed practice in [facility] and moved to [place].  She has been followed with band fills and has been compliamt but despite this she has had weight regain and hasn't reached her goal.  She understands that sometimes we have to stage the lapband removal and sleeve gastrectomy but hopefully we can perform both.    Past Medical History:  Diagnosis Date  . Anemia 2016   with bleeding ulcer  . Arthritis    oa hands  . GERD (gastroesophageal reflux disease)    no current meds  . Headache    headaches and migraines  . History of bleeding ulcers   . Hyperlipidemia    no cholesterol meds taken  . Hypertension   . Pre-diabetes     Past Surgical History:  Procedure Laterality Date  . BILATERAL SALPINGECTOMY Bilateral 03/22/2013   Procedure: BILATERAL SALPINGECTOMY;  Surgeon: Marvene Staff, MD;  Location: [facility];  Service: Gynecology;  Laterality: Bilateral;  . CARPAL TUNNEL RELEASE Right 11/15/2013   Procedure: RIGHT CARPAL TUNNEL RELEASE;  Surgeon: Wynonia Sours, MD;  Location: [facility];  Service: Orthopedics;  Laterality: Right;  . COSMETIC SURGERY  01/2015   tummy tuck   . ESOPHAGOGASTRODUODENOSCOPY N/A 10/04/2015   Procedure: ESOPHAGOGASTRODUODENOSCOPY (EGD);  Surgeon: Manus Gunning, MD;  Location: [facility];  Service: Gastroenterology;  Laterality: N/A;  . GANGLION CYST EXCISION Right 11/15/2013   Procedure: EXCISION CYST RIGHT WRIST;  Surgeon: Wynonia Sours, MD;  Location: [facility];  Service: Orthopedics;  Laterality: Right;  . LAPAROSCOPIC GASTRIC BANDING  2010  . LAPAROSCOPY N/A 03/22/2013   Procedure: LAPAROSCOPY OPERATIVE;  Surgeon: Marvene Staff, MD;  Location: [facility];  Service:  Gynecology;  Laterality: N/A;  . MOUTH SURGERY  1991  . SALPINGECTOMY  03/22/2013    Current Outpatient Prescriptions  Medication Sig Dispense Refill  . acetaminophen (TYLENOL) 325 MG tablet Take 650 mg by mouth every 6 (six) hours as needed.    . Aspirin-Salicylamide-Caffeine (BC HEADACHE POWDER PO) Take 1 packet by mouth daily as needed (headaches).    . Chlorphen-Phenyleph-ASA (ALKA-SELTZER PLUS COLD PO) Take 2 tablets by mouth at bedtime as needed (cold symptoms).    [name] doxylamine, Sleep, (UNISOM) 25 MG tablet Take 25 mg by mouth at bedtime as needed for sleep.    [name] levonorgestrel (MIRENA, 52 MG,) 20 MCG/24HR IUD 1 each by Intrauterine route once. Inserted 2017    . telmisartan-hydrochlorothiazide (MICARDIS HCT) 40-12.5 MG tablet Take 1 tablet by mouth daily.     Current Facility-Administered Medications  Medication Dose Route Frequency Provider Last Rate Last Dose  . 0.9 %  sodium chloride infusion  500 mL Intravenous Continuous Armbruster, Carlota Raspberry, MD       Patient has no known allergies. Family History  Problem Relation Age of Onset  . Cancer Mother        pancreatic  . Stroke Mother   . Diabetes Mother   . Hypertension Father   . Ulcers Father   . Hypertension Brother    Social History:   reports that she has never smoked. She has never used smokeless tobacco. She reports that she drinks about 0.6 oz of alcohol per week . She  reports that she does not use drugs.   REVIEW OF SYSTEMS : Negative except for see problem list3  Physical Exam:   There were no vitals taken for this visit. There is no height or weight on file to calculate BMI.  Gen:  WDWN AAF NAD  Neurological: Alert and oriented to person, place, and time. Motor and sensory function is grossly intact  Head: Normocephalic and atraumatic.  Eyes: Conjunctivae are normal. Pupils are equal, round, and reactive to light. No scleral icterus.  Neck: Normal range of motion. Neck supple. No tracheal deviation or  thyromegaly present.  Cardiovascular:  SR without murmurs or gallops.  No carotid bruits Breast:  Not examined Respiratory: Effort normal.  No respiratory distress. No chest wall tenderness. Breath sounds normal.  No wheezes, rales or rhonchi.  Abdomen:  Port in left upper quadrant GU:  Not examined Musculoskeletal: Normal range of motion. Extremities are nontender. No cyanosis, edema or clubbing noted Lymphadenopathy: No cervical, preauricular, postauricular or axillary adenopathy is present Skin: Skin is warm and dry. No rash noted. No diaphoresis. No erythema. No pallor. Pscyh: Normal mood and affect. Behavior is normal. Judgment and thought content normal.   LABORATORY RESULTS: Results for orders placed or performed during the hospital encounter of 09/16/17 (from the past 48 hour(s))  hCG, serum, qualitative     Status: None   Collection Time: 09/16/17  1:48 PM  Result Value Ref Range   Preg, Serum NEGATIVE NEGATIVE    Comment:        THE SENSITIVITY OF THIS METHODOLOGY IS >10 mIU/mL.   Basic metabolic panel     Status: Abnormal   Collection Time: 09/16/17  1:48 PM  Result Value Ref Range   Sodium 138 135 - 145 mmol/L   Potassium 3.7 3.5 - 5.1 mmol/L   Chloride 100 (L) 101 - 111 mmol/L   CO2 29 22 - 32 mmol/L   Glucose, Bld 99 65 - 99 mg/dL   BUN 8 6 - 20 mg/dL   Creatinine, Ser 0.93 0.44 - 1.00 mg/dL   Calcium 9.5 8.9 - 10.3 mg/dL   GFR calc non Af Amer >60 >60 mL/min   GFR calc Af Amer >60 >60 mL/min    Comment: (NOTE) The eGFR has been calculated using the CKD EPI equation. This calculation has not been validated in all clinical situations. eGFR's persistently <60 mL/min signify possible Chronic Kidney Disease.    Anion gap 9 5 - 15  Hemoglobin A1c     Status: Abnormal   Collection Time: 09/16/17  1:48 PM  Result Value Ref Range   Hgb A1c MFr Bld 5.8 (H) 4.8 - 5.6 %    Comment: (NOTE)         Prediabetes: 5.7 - 6.4         Diabetes: >6.4         Glycemic control  for adults with diabetes: <7.0    Mean Plasma Glucose 120 mg/dL    Comment: (NOTE) Performed At: [facility] [place], [place] [id number] Lindon Romp MD WU:[id number]   CBC     Status: None   Collection Time: 09/16/17  1:48 PM  Result Value Ref Range   WBC 6.3 4.0 - 10.5 K/uL   RBC 4.24 3.87 - 5.11 MIL/uL   Hemoglobin 12.7 12.0 - 15.0 g/dL   HCT 39.0 36.0 - 46.0 %   MCV 92.0 78.0 - 100.0 fL   MCH 30.0 26.0 - 34.0 pg  MCHC 32.6 30.0 - 36.0 g/dL   RDW 13.5 11.5 - 15.5 %   Platelets 307 150 - 400 K/uL     RADIOLOGY RESULTS: No results found.  Problem List: Patient Active Problem List   Diagnosis Date Noted  . Upper GI bleed 10/03/2015  . Syncope 10/03/2015  . Acute blood loss anemia 10/03/2015  . Hypotension 10/03/2015  . Acute upper GI bleed 10/03/2015  . Hyperlipidemia   . Hypertension   . Lapband APS April 2010 by Dr. Evorn Gong in [facility] 08/07/2012    Assessment & Plan: For lapband removal and conversion to sleeve gastrectomy    Matt B. Hassell Done, MD, [facility] [phone] beeper [phone]  09/18/2017 12:20 PM

## 2017-09-23 NOTE — Op Note (Signed)
Surgeon: Wenda LowMatt Makinley Muscato, MD, FACS  Asst:  Jaclynn GuarneriBen Hoxworth MD FACS  Anes:  General endotracheal  Procedure: Removal of lap band and conversion to laparoscopic sleeve gastrectomy and upper endoscopy  Diagnosis: Morbid obesity  Complications: None noted  EBL:   10 cc  Description of Procedure:  The patient was take to OR 2 and given general anesthesia.  The abdomen was prepped with PCMX and draped sterilely.  A timeout was performed.  Access to the abdomen was achieved with 5 mm Optiview through the left upper quadrant with some difficulty owing to the toughness of the anterior abdominal wall due to weight loss and the prior tummy tuck that she has had causing loss of abdominal volume.  Usual 6 port used including a 15 slightly to the right of the neo-umbilicus and a 5/8 laterally on the right for the air seal device..  Following insufflation, the state of the abdomen was found to be with adhesions to the anterior abdominal wall to the left of midline at the site of her port.  The ViSiGi 36Fr tube was inserted to deflate the stomach and was pulled back into the esophagus.  The lap band was taken down using sharp dissection from the liver and then eventually dividing it and removing the pieces through the 15 mm port slightly to the right of midline.  The cicatrix was divided longitudinally and the forget was pretty well cleaned up in that area and we took down the plications and laid out the stomach nicely.  The pylorus was identified and we measured 5 cm back and marked the antrum.  At that point we began dissection to take down the greater curvature of the stomach using the Harmonic scalpel.  This dissection was taken all the way up to the left crus.  Posterior attachments of the stomach were also taken down.    The ViSiGi tube was then passed into the antrum and suction applied so that it was snug along the lessor curvature.  The "crow's foot" or incisura was identified.  The sleeve gastrectomy was  begun using the Lexmark InternationalCovidien platform stapler beginning with a multiple firings of the black load Covidien with TR S all the way to the cardia..  When the sleeve was complete the tube was taken off suction and insufflated briefly.  The tube was withdrawn.  Upper endoscopy was then performed by Dr. Johna SheriffHoxworth and a nice cylinder was noted.  She had a small pouch above her band but below the diaphragm..     The specimen was extracted through the 15 trocar site.  This port site was closed with a 0 Vicryl placed with a device.  Wounds were infiltrated with Exparel and closed with 4-0 Monocryl and Dermabond.    Matt B. Daphine DeutscherMartin, MD, [facility]ACS [facility], [place] [phone]

## 2017-09-23 NOTE — Anesthesia Postprocedure Evaluation (Signed)
Anesthesia Post Note  Patient: Susan [name]  Procedure(s) Performed: LAPAROSCOPIC GASTRIC BAND REMOVAL WITH LAPAROSCOPIC GASTRIC SLEEVE RESECTION (N/A )     Patient location during evaluation: PACU Anesthesia Type: General Level of consciousness: awake and alert Pain management: pain level controlled Vital Signs Assessment: post-procedure vital signs reviewed and stable Respiratory status: spontaneous breathing, nonlabored ventilation, respiratory function stable and patient connected to nasal cannula oxygen Cardiovascular status: blood pressure returned to baseline and stable Postop Assessment: no apparent nausea or vomiting Anesthetic complications: no    Last Vitals:  Vitals:   09/23/17 2029 09/23/17 2124  BP: 129/73 118/67  Pulse: 80 80  Resp: 16 18  Temp: 36.9 C 36.9 C  SpO2: 98% 98%    Last Pain:  Vitals:   09/23/17 2124  TempSrc: Oral  PainSc:                  Susan [name]

## 2017-09-23 NOTE — Interval H&P Note (Signed)
History and Physical Interval Note:  09/23/2017 1:18 PM  Susan [name]  has presented today for surgery, with the diagnosis of Gastric Ulcer, GERD, HTN, Morbid Obesity, S/P Lap Band Placement  The various methods of treatment have been discussed with the patient and family. After consideration of risks, benefits and other options for treatment, the patient has consented to  Procedure(s): LAPAROSCOPIC GASTRIC BAND REMOVAL WITH LAPAROSCOPIC GASTRIC SLEEVE RESECTION (N/A) as a surgical intervention .  The patient's history has been reviewed, patient examined, no change in status, stable for surgery.  I have reviewed the patient's chart and labs.  Questions were answered to the patient's satisfaction.     Maddalynn Barnard B

## 2017-09-23 NOTE — Op Note (Signed)
Procedure: Upper GI endoscopy  Description of procedure: Upper GI endoscopy is performed at the completion of laparoscopic sleeve gastrectomy by Dr. Daphine DeutscherMartin the video endoscope was introduced into the upper esophagus and then passed to the EG junction at about 35 cm. The esophagus appeared normal. The gastric sleeve was entered.  There was some mild narrowing about 5 cm distal to the EG junction at the site of the previous band cicatrix but the scope could be easily passed beyond this.  The sleeve was tensely distended with air while the outlet was obstructed under saline irrigation by the operating surgeon. There was no evidence of leak. The staple line was intact and without bleeding. The scope was advanced to the antrum and pylorus visualized. There was no stricture or twisting or mucosal abnormality, and particularly no narrowing noted at the incisura.  The pouch was then desufflated and the scope withdrawn.  Mariella SaaBenjamin T Mabell Esguerra MD, FACS  09/23/2017, 3:56 PM

## 2017-09-23 NOTE — Transfer of Care (Signed)
Immediate Anesthesia Transfer of Care Note  Patient: Susan [name]  Procedure(s) Performed: LAPAROSCOPIC GASTRIC BAND REMOVAL WITH LAPAROSCOPIC GASTRIC SLEEVE RESECTION (N/A )  Patient Location: PACU  Anesthesia Type:General  Level of Consciousness: awake, oriented and patient cooperative  Airway & Oxygen Therapy: Patient Spontanous Breathing and Patient connected to face mask oxygen  Post-op Assessment: Report given to RN and Post -op Vital signs reviewed and stable  Post vital signs: Reviewed and stable  Last Vitals:  Vitals:   09/23/17 1132  BP: (!) 151/103  Pulse: 83  Resp: 16  Temp: 37.2 C  SpO2: 100%    Last Pain:  Vitals:   09/23/17 1132  TempSrc: Oral      Patients Stated Pain Goal: 3 (09/23/17 1301)  Complications: No apparent anesthesia complications

## 2017-09-23 NOTE — Anesthesia Procedure Notes (Signed)
Procedure Name: Intubation Date/Time: 09/23/2017 1:52 PM Performed by: Anne Fu Pre-anesthesia Checklist: Patient identified, Emergency Drugs available, Suction available, Patient being monitored and Timeout performed Patient Re-evaluated:Patient Re-evaluated prior to induction Oxygen Delivery Method: Circle system utilized Preoxygenation: Pre-oxygenation with 100% oxygen Induction Type: IV induction Ventilation: Mask ventilation without difficulty Laryngoscope Size: Mac and 4 Grade View: Grade I Tube type: Oral Tube size: 7.5 mm Number of attempts: 1 Airway Equipment and Method: Stylet Placement Confirmation: ETT inserted through vocal cords under direct vision,  positive ETCO2 and breath sounds checked- equal and bilateral Secured at: 21 cm Tube secured with: Tape Dental Injury: Teeth and Oropharynx as per pre-operative assessment

## 2017-09-24 ENCOUNTER — Encounter (HOSPITAL_COMMUNITY): Payer: Self-pay | Admitting: Surgery

## 2017-09-24 LAB — CBC WITH DIFFERENTIAL/PLATELET
Basophils Absolute: 0 10*3/uL (ref 0.0–0.1)
Basophils Relative: 0 %
Eosinophils Absolute: 0 10*3/uL (ref 0.0–0.7)
Eosinophils Relative: 0 %
HEMATOCRIT: 32.5 % — AB (ref 36.0–46.0)
HEMOGLOBIN: 10.7 g/dL — AB (ref 12.0–15.0)
LYMPHS ABS: 1.5 10*3/uL (ref 0.7–4.0)
Lymphocytes Relative: 11 %
MCH: 30.1 pg (ref 26.0–34.0)
MCHC: 32.9 g/dL (ref 30.0–36.0)
MCV: 91.3 fL (ref 78.0–100.0)
MONOS PCT: 4 %
Monocytes Absolute: 0.6 10*3/uL (ref 0.1–1.0)
NEUTROS ABS: 11.6 10*3/uL — AB (ref 1.7–7.7)
NEUTROS PCT: 85 %
Platelets: 353 10*3/uL (ref 150–400)
RBC: 3.56 MIL/uL — ABNORMAL LOW (ref 3.87–5.11)
RDW: 13.4 % (ref 11.5–15.5)
WBC: 13.7 10*3/uL — ABNORMAL HIGH (ref 4.0–10.5)

## 2017-09-24 NOTE — Discharge Summary (Signed)
Physician Discharge Summary  Patient ID: Susan [name] MRN: [id number] DOB/AGE: [birth year] 45 y.o.  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 Lapband originally placed in [facility] was removed and sleeve performed.   Ready for discharge on Wednesday.    Consults: none  Significant Diagnostic Studies: none    Discharge Exam: Blood pressure (!) 89/59, pulse 74, temperature 98.6 F (37 C), temperature source Oral, resp. rate 16, height 5\' 2"  (1.575 m), weight 78.3 kg (172 lb 9.6 oz), SpO2 100 %. Incisions OK.    Disposition: 01-Home or Self Care  Discharge Instructions    Ambulate hourly while awake    Complete by:  As directed    Call MD for:  difficulty breathing, headache or visual disturbances    Complete by:  As directed    Call MD for:  persistant dizziness or light-headedness    Complete by:  As directed    Call MD for:  persistant nausea and vomiting    Complete by:  As directed    Call MD for:  redness, tenderness, or signs of infection (pain, swelling, redness, odor or green/yellow discharge around incision site)    Complete by:  As directed    Call MD for:  severe uncontrolled pain    Complete by:  As directed    Call MD for:  temperature >101 F    Complete by:  As directed    Diet bariatric full liquid    Complete by:  As directed    Incentive spirometry    Complete by:  As directed    Perform hourly while awake     Allergies as of 09/24/2017   No Known Allergies     Medication List    TAKE these medications   acetaminophen 325 MG tablet Commonly known as:  TYLENOL Take 650 mg by mouth every 6 (six) hours as needed.   ALKA-SELTZER PLUS COLD PO Take 2 tablets by mouth at bedtime as needed (cold  symptoms).   BC HEADACHE POWDER PO Take 1 packet by mouth daily as needed (headaches). Notes to patient:  Avoid NSAIDs for 6-8 weeks after surgery   doxylamine (Sleep) 25 MG tablet Commonly known as:  UNISOM Take 25 mg by mouth at bedtime as needed for sleep.   MIRENA (52 MG) 20 MCG/24HR IUD Generic drug:  levonorgestrel 1 each by Intrauterine route once. Inserted 2017   telmisartan-hydrochlorothiazide 40-12.5 MG tablet Commonly known as:  MICARDIS HCT Take 1 tablet by mouth daily. Notes to patient:  Monitor Blood Pressure Daily and keep a log for primary care physician.  You may need to make changes to your medications with rapid weight loss.        Follow-up Information    Ovidio KinNewman, David, MD. Go on 10/09/2017.   Specialty:  General Surgery Why:  at 0315 Contact information: [address] [place] [address] [phone]        Luretha MurphyMartin, Fynn Adel, MD Follow up.   Specialty:  General Surgery Contact information: [address] [place] [address] [phone]           Signed: Valarie MerinoMARTIN,Shondale Quinley B 09/24/2017, 9:31 AM

## 2017-09-24 NOTE — Plan of Care (Signed)
Problem: Food- and Nutrition-Related Knowledge Deficit (NB-1.1) Goal: Nutrition education Formal process to instruct or train a patient/client in a skill or to impart knowledge to help patients/clients voluntarily manage or modify food choices and eating behavior to maintain or improve health. Outcome: Completed/Met Date Met: 09/24/17 Nutrition Education Note  Received consult for diet education per DROP protocol.   Discussed 2 week post op diet with pt. Emphasized that liquids must be non carbonated, non caffeinated, and sugar free. Fluid goals discussed. Pt to follow up with outpatient bariatric RD for further diet progression after 2 weeks. Multivitamins and minerals also reviewed. Teach back method used, pt expressed understanding, expect good compliance.   Diet: First 2 Weeks  You will see the nutritionist about two (2) weeks after your surgery. The nutritionist will increase the types of foods you can eat if you are handling liquids well:  If you have severe vomiting or nausea and cannot handle clear liquids lasting longer than 1 day, call your surgeon  Protein Shake  Drink at least 2 ounces of shake 5-6 times per day  Each serving of protein shakes (usually 8 - 12 ounces) should have a minimum of:  15 grams of protein  And no more than 5 grams of carbohydrate  Goal for protein each day:  Men = 80 grams per day  Women = 60 grams per day  Protein powder may be added to fluids such as non-fat milk or Lactaid milk or Soy milk (limit to 35 grams added protein powder per serving)   Hydration  Slowly increase the amount of water and other clear liquids as tolerated (See Acceptable Fluids)  Slowly increase the amount of protein shake as tolerated  Sip fluids slowly and throughout the day  May use sugar substitutes in small amounts (no more than 6 - 8 packets per day; i.e. Splenda)   Fluid Goal  The first goal is to drink at least 8 ounces of protein shake/drink per day (or as directed  by the nutritionist); some examples of protein shakes are Premier Protein, Johnson [employer] Johnson, AMR Corporation, EAS Edge HP, and Unjury. See handout from pre-op Bariatric Education Class:  Slowly increase the amount of protein shake you drink as tolerated  You may find it easier to slowly sip shakes throughout the day  It is important to get your proteins in first  Your fluid goal is to drink 64 - 100 ounces of fluid daily  It may take a few weeks to build up to this  32 oz (or more) should be clear liquids  And  32 oz (or more) should be full liquids (see below for examples)  Liquids should not contain sugar, caffeine, or carbonation   Clear Liquids:  Water or Sugar-free flavored water (i.e. Fruit H2O, Propel)  Decaffeinated coffee or tea (sugar-free)  Crystal Lite, Wyler's Lite, Minute Maid Lite  Sugar-free Jell-O  Bouillon or broth  Sugar-free Popsicle: *Less than 20 calories each; Limit 1 per day   Full Liquids:  Protein Shakes/Drinks + 2 choices per day of other full liquids  Full liquids must be:  No More Than 12 grams of Carbs per serving  No More Than 3 grams of Fat per serving  Strained low-fat cream soup  Non-Fat milk  Fat-free Lactaid Milk  Sugar-free yogurt (Dannon Lite & Fit, [place] yogurt, Oikos Zero)   Mariana Single RD, LDN Clinical Nutrition Pager # - [phone]

## 2017-09-24 NOTE — Progress Notes (Signed)
Patient alert and oriented, Post op day 1.  Provided support and encouragement.  Encouraged pulmonary toilet, ambulation and small sips of liquids.  Completed 12 ounces of clear fluids and 4 ounces of protein.   All questions answered.  Will continue to monitor.

## 2017-09-24 NOTE — Progress Notes (Signed)
Patient alert and oriented, pain is controlled. Patient is tolerating fluids, advanced to protein shake today, patient is tolerating well.  Reviewed Gastric sleeve discharge instructions with patient and patient is able to articulate understanding.  Provided information on BELT program, Support Group and [facility] outpatient pharmacy. All questions answered, will continue to monitor.  

## 2017-09-24 NOTE — Progress Notes (Signed)
Discharge instructions reviewed with patient. All questions answered. Patient wheeled down with belongings by nurse tech. 

## 2017-09-25 ENCOUNTER — Other Ambulatory Visit: Payer: Self-pay | Admitting: *Deleted

## 2017-09-25 NOTE — Patient Outreach (Addendum)
[facility]) Care Management  09/25/2017  Susan OmsFelicia [name] [birth year] [id number]   Subjective: Telephone call to patient's home  number, no answer, left HIPAA compliant voicemail message, and requested call back.   Objective: Per KPN (Knowledge Performance Now, point of care tool), [employer] iCollaborate, and chart review, patient hospitalized  09/23/17 - 09/24/17 for Morbid obesity.   Status post Removal of lap band and conversion to laparoscopic sleeve gastrectomy and upper endoscopy on 09/23/17.    Assessment: Received [employer] Transition of care referral on 09/25/17.  Transition of care follow up pending patient contact.   Plan: RNCM will call patient for 2nd telephone outreach attempt, transition of care follow up, within 10 business days if no return call.      Emmersyn Kratzke H. Gardiner Barefootooper RN, BSN, CCM [facility] Care Management [facility] Telephonic CM Phone: [phone] Fax: [phone]

## 2017-09-26 ENCOUNTER — Other Ambulatory Visit: Payer: Self-pay | Admitting: *Deleted

## 2017-09-26 NOTE — Patient Outreach (Signed)
[facility]) Care Management  09/26/2017  Lenon OmsFelicia Sider [birth year] [id number]   Subjective: Telephone call to patient's home  number, no answer, left HIPAA compliant voicemail message, and requested call back.   Objective: Per KPN (Knowledge Performance Now, point of care tool), [employer] iCollaborate, and chart review, patient hospitalized  09/23/17 - 09/24/17 for Morbid obesity.   Status post Removal of lap band and conversion to laparoscopic sleeve gastrectomy and upper endoscopy on 09/23/17.   [facility] completed on 07/18/17.    Assessment: Received [employer] Transition of care referral on 09/25/17. Transition of care follow up pending patient contact.    Plan: RNCM will call patient for 3rd telephone outreach attempt, transition of care follow up, within 10 business days if no return call.      Chase Arnall H. Gardiner Barefootooper RN, BSN, CCM [facility] Care Management [facility] Telephonic CM Phone: [phone] Fax: [phone]

## 2017-09-29 ENCOUNTER — Encounter: Payer: Self-pay | Admitting: *Deleted

## 2017-09-29 ENCOUNTER — Other Ambulatory Visit: Payer: Self-pay | Admitting: *Deleted

## 2017-09-29 ENCOUNTER — Ambulatory Visit: Payer: Self-pay | Admitting: *Deleted

## 2017-09-29 ENCOUNTER — Telehealth (HOSPITAL_COMMUNITY): Payer: Self-pay

## 2017-09-29 NOTE — Telephone Encounter (Signed)
Follow up with bariatric surgical patient to discuss post discharge questions.  No answer at this time patient voicemail box is full  1.  Are you having any pain not relieved by pain medication?  2.  How much fluid total fluid intake have you had in the last 24/48 hours?    3.  How much protein intake have you had in the last 24/48 hours?  4.  Have you had any trouble making urine?  5.  Have you had nausea that has not been relieved by nausea medication?  6.  Are you ambulating every hour?  7.  Are you passing gas or had a BM?  8.  Do you know how to contact BNC? CCS? NDES?  9.  Are you taking your vitamins and calcium without difficulty?  10. Tell me how your incision looks?  Any redness, open incision, or drainage?

## 2017-09-29 NOTE — Patient Outreach (Addendum)
[facility]) Care Management  09/29/2017  Susan OmsFelicia [name] 20-Nov-1971 [id number]   Subjective: Telephone call to patient's home number, spoke with patient, and HIPAA verified.  Discussed [facility] Care [facility] [employer] Transition of care follow up, patient voiced understanding, and is in agreement to follow up.  Patient states she is doing well, tolerating liquid diet, able to self manage activities of daily living / home management, has a follow up appointment with primary MD on 10/01/17, has follow up appointment with surgeon on 10/09/17, and will be returning to work on 10/01/17.  Patient voices understanding of medical diagnosis, surgery, and treatment plan. States she is accessing her Susan [name] benefits as needed via member services number on back of card or through [email or web address].   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 Patient states she does not have any education material, transition of care, care coordination, disease management, disease monitoring, transportation, community resource, or pharmacy needs at this time.  States she is very appreciative of the follow up and is in agreement to receive [facility] information.   Objective: Per KPN (Knowledge Performance Now, point of care tool), [employer] iCollaborate, and chart review, patient hospitalized 09/23/17 - 09/24/17 for Morbid obesity. Status post Removal of lap band and conversion to laparoscopic sleeve gastrectomy and upper endoscopy on 09/23/17.   [facility] Consult completed on 07/18/17.    Assessment: Received [employer] Transition of care referral on 09/25/17. Transition of care follow up completed, no care management needs, and will proceed with case closure.     Plan: RNCM will send patient successful outreach letter, [facility] pamphlet, and magnet. RNCM will send case closure due to follow up completed / no care management needs request to [name].      Susan Noori H. Gardiner Barefootooper RN, BSN, CCM [facility] Care Management [facility] Telephonic CM Phone: [phone] Fax: [phone]

## 2017-09-30 NOTE — Patient Outreach (Signed)
Erroneous encounter, due to a malfunction in Epic I was not able to close this encounter

## 2017-10-01 ENCOUNTER — Telehealth (HOSPITAL_COMMUNITY): Payer: Self-pay

## 2017-10-01 NOTE — Telephone Encounter (Signed)
Follow up with bariatric surgical patient to discuss post discharge questions.    1.  Are you having any pain not relieved by pain medication?No, minimal pain has not used pain medication twice  2.  How much fluid total fluid intake have you had in the last 24/48 hours?  Fluid intake 32 ounces from yeti cup and then popsicle and jello  3.  How much protein intake have you had in the last 24/48 hours?42 grams  4.  Have you had any trouble making urine?No, my urine is light in colof  5.  Have you had nausea that has not been relieved by nausea medication?has not experienced nausea, protein tastes bad though  6.  Are you ambulating every hour?yes back at work  7.  Are you passing gas or had a BM?yes   8.  Do you know how to contact BNC? CCS? NDES?yes  9.  Are you taking your vitamins and calcium without difficulty?yes  10. Tell me how your incision looks?  Any redness, open incision, or drainage?look good

## 2017-10-07 ENCOUNTER — Encounter: Payer: Managed Care, Other (non HMO) | Attending: Surgery | Admitting: Registered"

## 2017-10-07 DIAGNOSIS — Z713 Dietary counseling and surveillance: Secondary | ICD-10-CM | POA: Diagnosis not present

## 2017-10-07 DIAGNOSIS — Z6830 Body mass index (BMI) 30.0-30.9, adult: Secondary | ICD-10-CM | POA: Diagnosis not present

## 2017-10-07 DIAGNOSIS — E669 Obesity, unspecified: Secondary | ICD-10-CM

## 2017-10-09 NOTE — Progress Notes (Signed)
Bariatric Class:  Appt start time: 1530 end time:  1630.  2 Week Post-Operative Nutrition Class  Patient was seen on 10/07/2017 for [facility]ative [facility]   Surgery date: 09/23/2017 Surgery type: Sleeve gastrectomy Start weight at [facility]: 168.8 Weight today: 166.8 Weight change: 2 lbs loss  Pt states she has had an increase in blood pressure medications. Pt states she has experienced headaches, sweating more than usual, dizziness/lightheadedness, and some abdominal pain.   TANITA  BODY COMP RESULTS  10/07/2017   BMI (kg/m^2) 31.5   Fat Mass (lbs) 71.4   Fat Free Mass (lbs) 95.4   Total Body Water (lbs) 67.4   The following the learning objectives were met by the patient during this course:  Identifies Phase 3A (Soft, High Proteins) Dietary Goals and will begin from 2 weeks post-operatively to 2 months post-operatively  Identifies appropriate sources of fluids and proteins   States protein recommendations and appropriate sources post-operatively  Identifies the need for appropriate texture modifications, mastication, and bite sizes when consuming solids  Identifies appropriate multivitamin and calcium sources post-operatively  Describes the need for physical activity post-operatively and will follow MD recommendations  States when to call healthcare provider regarding medication questions or post-operative complications  Handouts given during class include:  Phase 3A: Soft, High Protein Diet Handout  Follow-Up Plan: Patient will follow-up at [facility] in 6 weeks for 2 month post-op nutrition visit for diet advancement per MD.

## 2017-11-13 ENCOUNTER — Encounter: Payer: Self-pay | Admitting: Skilled Nursing Facility1

## 2017-11-13 ENCOUNTER — Encounter: Payer: Managed Care, Other (non HMO) | Attending: Surgery | Admitting: Skilled Nursing Facility1

## 2017-11-13 DIAGNOSIS — Z713 Dietary counseling and surveillance: Secondary | ICD-10-CM | POA: Insufficient documentation

## 2017-11-13 DIAGNOSIS — E669 Obesity, unspecified: Secondary | ICD-10-CM

## 2017-11-13 DIAGNOSIS — Z6830 Body mass index (BMI) 30.0-30.9, adult: Secondary | ICD-10-CM | POA: Diagnosis not present

## 2017-11-13 NOTE — Progress Notes (Signed)
Follow-up visit:  8 Weeks Post-Operative Sleeve Surgery  Primary concerns today: Post-operative Bariatric Surgery Nutrition Management.  Surgery date: 09/23/2017 Surgery type: Sleeve gastrectomy [facility] Weight today: 166.8 Weight change: 2 lbs loss  Pt arrives not meeting any of her needs: fluid, protein, or supplements  Pt states she had increased blood pressure due to needing a root canal. Pt states the GERD symptoms have completely subsided. Pt states she will be meeting a Systems analystpersonal trainer tomorrow. Pt states she does not like cheese.  Pt is a hard working Education officer, communitydentist.   TANITA  BODY COMP RESULTS  10/07/2017 11/13/2017   BMI (kg/m^2) 31.5 30.1   Fat Mass (lbs) 71.4 67.4   Fat Free Mass (lbs) 95.4 97   Total Body Water (lbs) 67.4 68.6   24-hr recall: she sucks at it B (AM): yogurt drink with protein scoops----bacon or boiled eggs---soup---turkey ricotta cheese Snk (AM):  L (PM): Snk (PM):  D (PM):  Snk (PM):   Fluid intake: water, lipton tea and tang: insufficient  Estimated total protein intake: insufficient   Medications: see list Supplementation: not taking  Using straws: no Drinking while eating: no Having you been chewing well:no Chewing/swallowing difficulties: no Changes in vision: no Changes to mood/headaches: no Hair loss/Cahnges to skin/Changes to nails: no Any difficulty focusing or concentrating: no Sweating: no Dizziness/Lightheaded:  Palpitations: no  Carbonated beverages: no N/V/D/C/GAS: once a day with benefiber Abdominal Pain: no Dumping syndrome: *Recent physical activity:  no  Progress Towards Goal(s):  In progress.   Nutritional Diagnosis:  [place]-3.3 Overweight/obesity related to past poor dietary habits and physical inactivity as evidenced by patient w/ recent sleeve surgery following dietary guidelines for continued weight loss.  Intervention:  Nutrition counseling. Dietitian educated the pt on the importance/implications of  not meeting her needs. Goals: -64 fluid ounces -60 grams of protein -take your multivitamin and calcium's  Teaching Method Utilized: Visual Auditory Hands on  Barriers to learning/adherence to lifestyle change: unaware of food/beverages need  Demonstrated degree of understanding via:  Teach Back   Monitoring/Evaluation:  Dietary intake, exercise, and body weight.

## 2017-12-15 ENCOUNTER — Encounter: Payer: Managed Care, Other (non HMO) | Attending: Surgery | Admitting: Skilled Nursing Facility1

## 2017-12-15 ENCOUNTER — Encounter: Payer: Self-pay | Admitting: Skilled Nursing Facility1

## 2017-12-15 DIAGNOSIS — E669 Obesity, unspecified: Secondary | ICD-10-CM

## 2017-12-15 DIAGNOSIS — Z6830 Body mass index (BMI) 30.0-30.9, adult: Secondary | ICD-10-CM | POA: Insufficient documentation

## 2017-12-15 DIAGNOSIS — Z713 Dietary counseling and surveillance: Secondary | ICD-10-CM | POA: Diagnosis not present

## 2017-12-15 NOTE — Progress Notes (Signed)
Follow-up visit:  8 Weeks Post-Operative Sleeve Surgery  Primary concerns today: Post-operative Bariatric Surgery Nutrition Management.  Surgery date: 09/23/2017 Surgery type: Sleeve gastrectomy [facility] Weight today: 159 Weight change: 7.8 lbs loss   Pt arrives doing much better and feeling really good about herself. Pt states she has been working on eating more often throughout the day using alarms and carrying things in her pocket and started with a  Physical trainer. Pt states she is focusing on lifestyle and not weight so not weighing at home. Pt states she wakes up in the middle night and drinks. Pt states she has not eaten out for the last 2 weeks and is working on getting off her blood pressure medication.  TANITA  BODY COMP RESULTS  10/07/2017 11/13/2017 12/15/2017   BMI (kg/m^2) 31.5 30.1 29.1   Fat Mass (lbs) 71.4 67.4 61.6   Fat Free Mass (lbs) 95.4 97 97.4   Total Body Water (lbs) 67.4 68.6 68.4   24-hr recall: 2 granola bars and 3 handfuls peanuts in her pockets  B (AM): yogurt drink with protein scoops----bacon or boiled eggs---soup---turkey ricotta cheese Snk (AM):  L (PM): chicken vegetable soup Snk (PM):  D (PM):  Snk (PM):   Fluid intake: water, lipton tea and tang:  2 8oz at night one at the gym and 1 32 oz  Estimated total protein intake: insufficient   Medications: see list Supplementation: multi and vitamin c viamine d calcium and biotin taking b12 (compared the doses to the bariatric multivitamin)   Using straws: no Drinking while eating: no Having you been chewing well:no Chewing/swallowing difficulties: no Changes in vision: no Changes to mood/headaches: no Hair loss/Cahnges to skin/Changes to nails: no Any difficulty focusing or concentrating: no Sweating: no Dizziness/Lightheaded:  Palpitations: no  Carbonated beverages: no N/V/D/C/GAS: once a day with benefiber Abdominal Pain: no Dumping syndrome:  Recent physical  activity:  3 days a week doing resistance   Progress Towards Goal(s):  In progress.   Nutritional Diagnosis:  [place]-3.3 Overweight/obesity related to past poor dietary habits and physical inactivity as evidenced by patient w/ recent sleeve surgery following dietary guidelines for continued weight loss.  Intervention:  Nutrition counseling. Dietitian educated the pt on the importance/implications of not meeting her needs. Goals: -64 fluid ounces -60 grams of protein -take your multivitamin and calcium's -Check your mg of thiamin  Teaching Method Utilized: Visual Auditory Hands on  Barriers to learning/adherence to lifestyle change: unaware of food/beverages need  Demonstrated degree of understanding via:  Teach Back   Monitoring/Evaluation:  Dietary intake, exercise, and body weight.

## 2018-01-22 ENCOUNTER — Encounter: Payer: Managed Care, Other (non HMO) | Attending: Surgery | Admitting: Skilled Nursing Facility1

## 2018-01-22 ENCOUNTER — Encounter: Payer: Self-pay | Admitting: Skilled Nursing Facility1

## 2018-01-22 DIAGNOSIS — E669 Obesity, unspecified: Secondary | ICD-10-CM

## 2018-01-22 DIAGNOSIS — Z713 Dietary counseling and surveillance: Secondary | ICD-10-CM | POA: Insufficient documentation

## 2018-01-22 DIAGNOSIS — Z6830 Body mass index (BMI) 30.0-30.9, adult: Secondary | ICD-10-CM | POA: Diagnosis not present

## 2018-01-22 NOTE — Patient Instructions (Addendum)
-  Add protein powder to smoothie

## 2018-01-22 NOTE — Progress Notes (Signed)
Follow-up visit:  8 Weeks Post-Operative Sleeve Surgery  Primary concerns today: Post-operative Bariatric Surgery Nutrition Management.  Surgery date: 09/23/2017 Surgery type: Sleeve gastrectomy [facility] Weight today: 159 Weight change: maintanied  Pts strategies: Putting drinks on desk and making smoothies with veggies keep fridge full at work and brings 100 calories of nuts and snacks and popcorn and cranberry. Pt states she has a bowel movement Every other day which is the best it has ever been in her life.  Pt would like to follow up to ensure she is making good choices.  Pts body goal: To be lean/fit  TANITA  BODY COMP RESULTS  10/07/2017 11/13/2017 12/15/2017 01/22/2018   BMI (kg/m^2) 31.5 30.1 29.1 29.2   Fat Mass (lbs) 71.4 67.4 61.6 62.8   Fat Free Mass (lbs) 95.4 97 97.4 96.8   Total Body Water (lbs) 67.4 68.6 68.4 68   24-hr recall: 2 granola bars and 3 handfuls peanuts in her pockets  B (AM): spinach banana with flax seed Snk (AM):  L (PM): rolled up deli meat Snk (PM): popcorn  D (PM): chicken wings or thigh Snk (PM): nuts   Fluid intake: water, lipton tea and tang:  2 8oz at night one at the gym and 1 32 oz  Estimated total protein intake: insufficient   Medications: see list Supplementation: multi and vitamin c viamine d calcium and biotin taking b12 (compared the doses to the bariatric multivitamin)   Using straws: no Drinking while eating: no Having you been chewing well:no Chewing/swallowing difficulties: no Changes in vision: no Changes to mood/headaches: no Hair loss/Cahnges to skin/Changes to nails: no Any difficulty focusing or concentrating: no Sweating: no Dizziness/Lightheaded:  Palpitations: no  Carbonated beverages: no N/V/D/C/GAS: once a day with benefiber Abdominal Pain: no Dumping syndrome:   Recent physical activity:  3 days a week  60 minutes cardio and weights  Progress Towards Goal(s):  In  progress.   Nutritional Diagnosis:  [place]-3.3 Overweight/obesity related to past poor dietary habits and physical inactivity as evidenced by patient w/ recent sleeve surgery following dietary guidelines for continued weight loss.  Intervention:  Nutrition counseling. Dietitian educated the pt on the importance/implications of not meeting her needs. Goals: -Add protein powder to your smoothie -Keep up the great work!  Teaching Method Utilized: Visual Auditory Hands on  Barriers to learning/adherence to lifestyle change: unaware of food/beverages need  Demonstrated degree of understanding via:  Teach Back   Monitoring/Evaluation:  Dietary intake, exercise, and body weight.

## 2018-02-12 IMAGING — RF DG UGI W/ KUB
14 of 18 series · 14 of 18 positions shown · non-contrast
Comparison: None.

CLINICAL DATA: Morbid obesity. Severe gastroesophageal reflux.
Pre-op evaluation for gastric sleeve. Current lap band.

EXAM:
UPPER GI SERIES WITH KUB
TECHNIQUE: After obtaining a scout radiograph a routine upper GI series was
performed using thin barium.
FLUOROSCOPY TIME:  Fluoroscopy Time: 2 minutes 0 seconds ; low dose
pulsed fluoroscopy
Radiation Exposure Index (if provided by the fluoroscopic device):
30.5 mGy
Number of Acquired Spot Images: 0

[Series 1: t abdomen supine · 0.15mm/px · 1 of 1 slices shown]
[im 1/1]
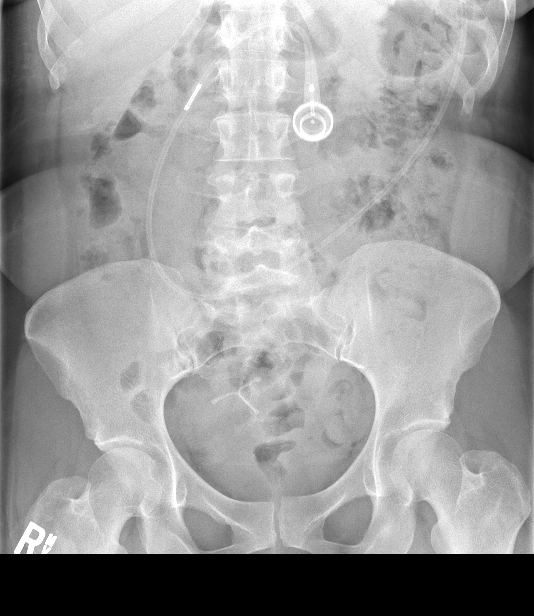

[Series 2: cp_standard · 0.17mm/px · 1 of 1 slices shown (1 of 13)]
[im 1/1]
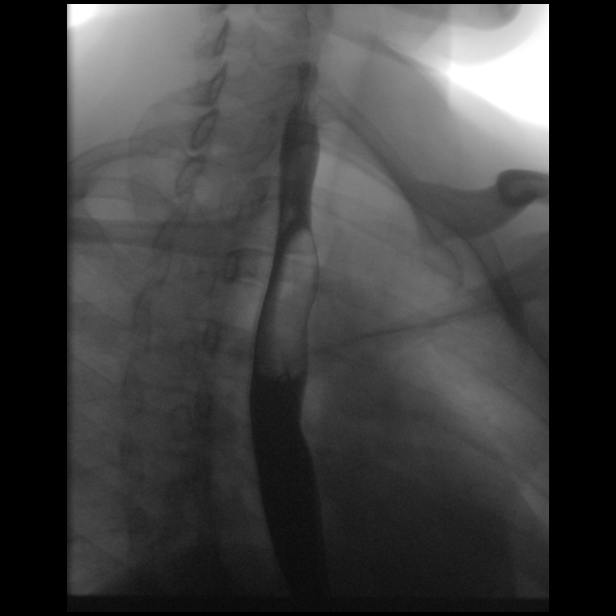

[Series 4: cp_standard · 0.17mm/px · 1 of 1 slices shown (2 of 13)]
[im 1/1]
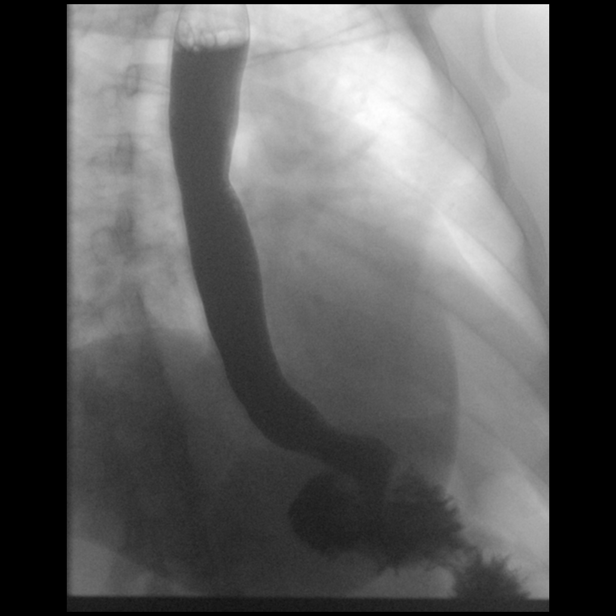

[Series 5: cp_standard · 0.17mm/px · 1 of 1 slices shown (3 of 13)]
[im 1/1]
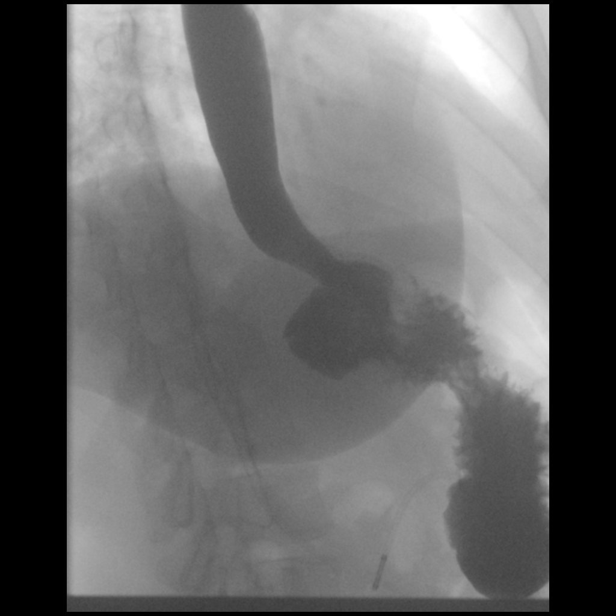

[Series 6: cp_standard · 0.17mm/px · 1 of 1 slices shown (4 of 13)]
[im 1/1]
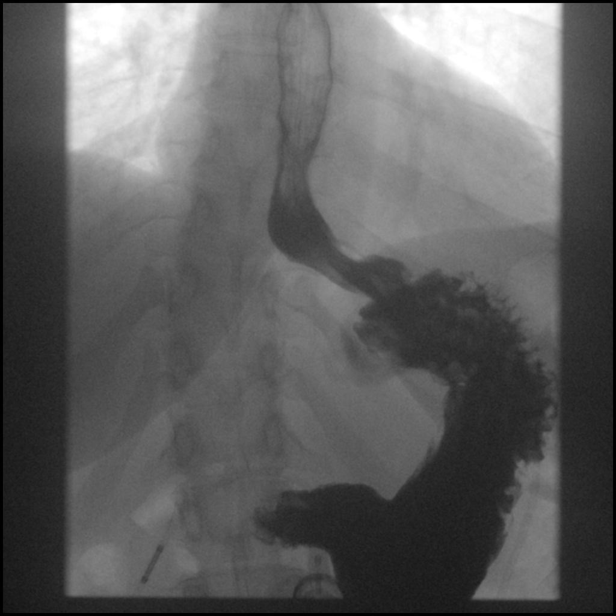

[Series 8: cp_standard · 0.23mm/px · 1 of 1 slices shown (5 of 13)]
[im 1/1]
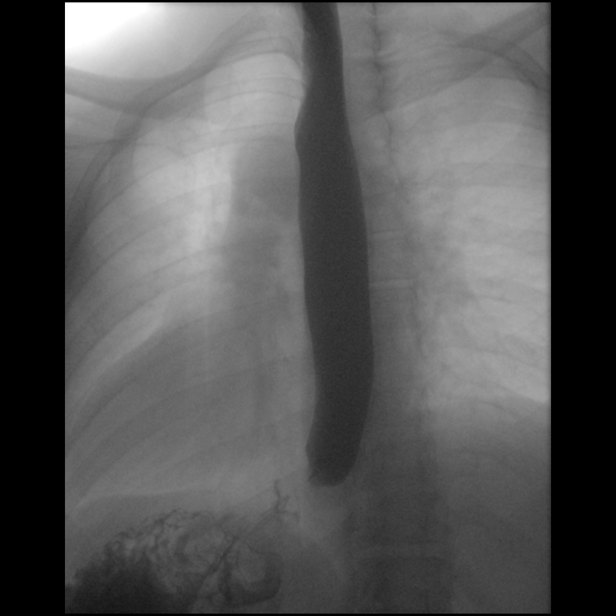

[Series 9: cp_standard · 0.23mm/px · 1 of 1 slices shown (6 of 13)]
[im 1/1]
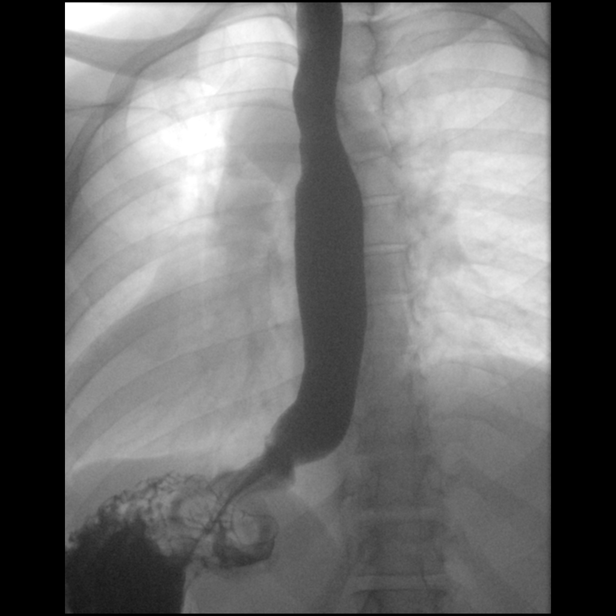

[Series 10: cp_standard · 0.23mm/px · 1 of 1 slices shown (7 of 13)]
[im 1/1]
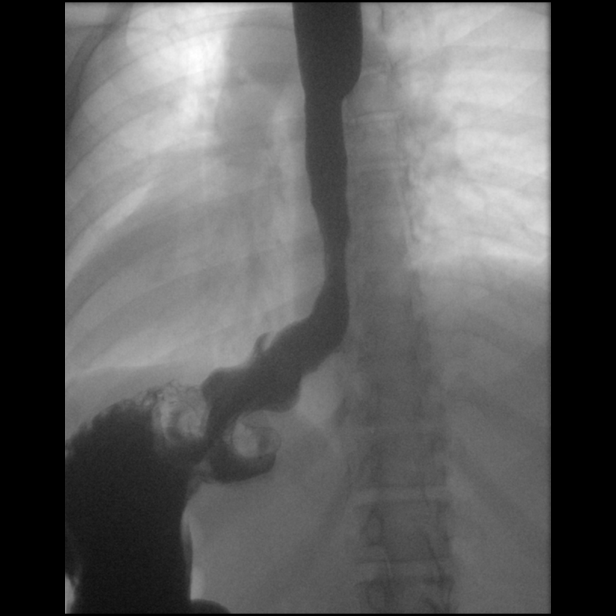

[Series 11: cp_standard · 0.22mm/px · 1 of 1 slices shown (8 of 13)]
[im 1/1]
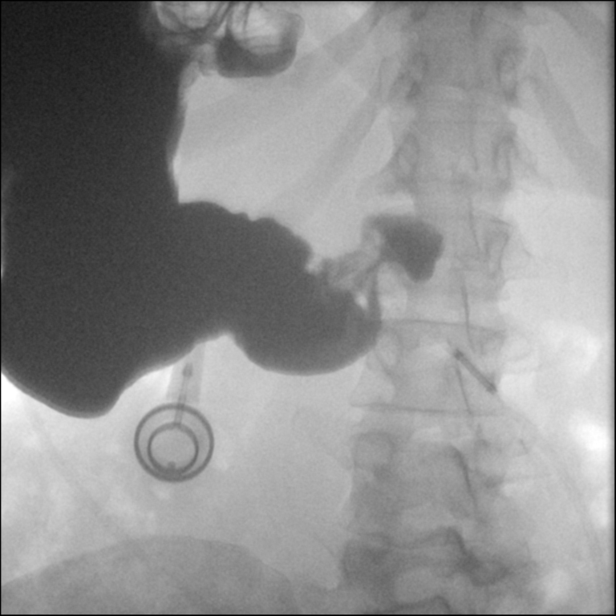

[Series 13: cp_standard · 0.23mm/px · 1 of 1 slices shown (9 of 13)]
[im 1/1]
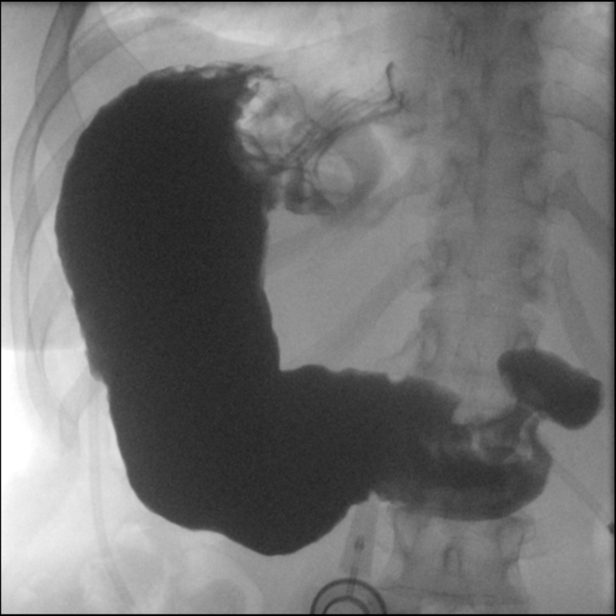

[Series 14: cp_standard · 0.23mm/px · 1 of 1 slices shown (10 of 13)]
[im 1/1]
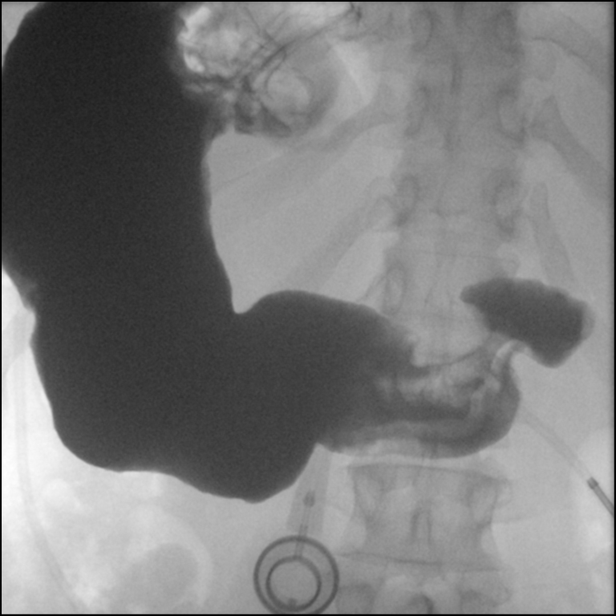

[Series 15: cp_standard · 0.20mm/px · 1 of 1 slices shown (11 of 13)]
[im 1/1]
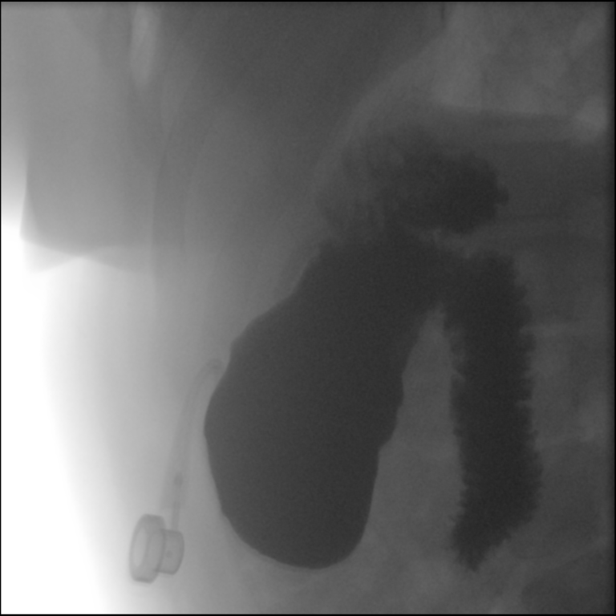

[Series 17: cp_standard · 0.22mm/px · 1 of 1 slices shown (12 of 13)]
[im 1/1]
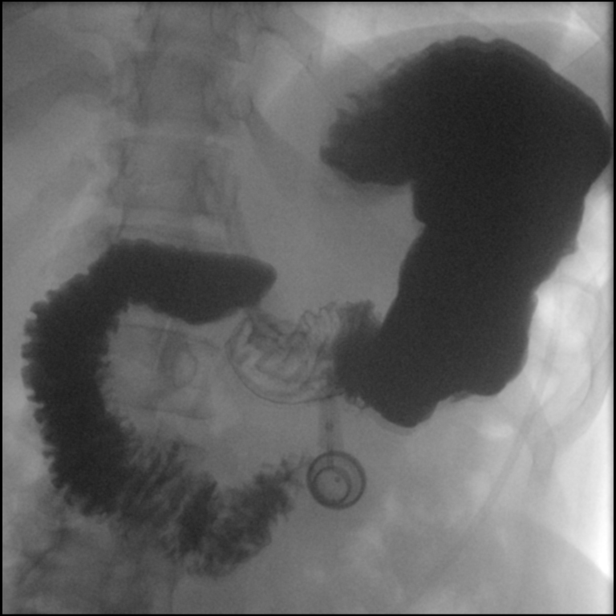

[Series 18: cp_standard · 0.23mm/px · 1 of 1 slices shown (13 of 13)]
[im 1/1]
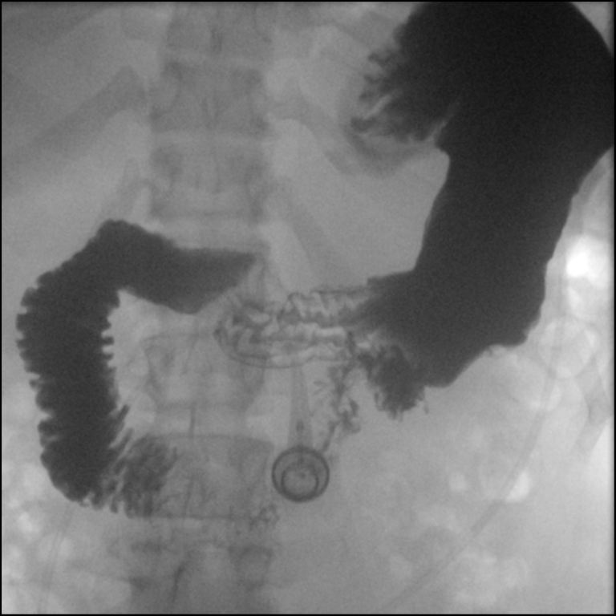

[14 of 18 positions shown; findings below may reference images not displayed]

FINDINGS: Scout radiograph shows a normal bowel gas pattern. Gastric lap band
seen expected position. IUD noted in pelvis.

Single column upper GI series shows normal appearance esophagus. No
evidence of esophageal mass or stricture. No evidence of hiatal
hernia. Esophageal motility is within normal limits. No
gastroesophageal reflux demonstrated.

Gastric lap band is seen in expected position. Stomach is otherwise
normal in appearance, without evidence of gastric masses or ulcers.
Prompt gastric emptying is demonstrated. Duodenal bulb and sweep are
normal in appearance.
IMPRESSION: Gastric lap band in appropriate position. Otherwise negative upper
GI series.

## 2018-05-21 ENCOUNTER — Encounter: Payer: Managed Care, Other (non HMO) | Attending: Surgery | Admitting: Skilled Nursing Facility1

## 2018-05-21 ENCOUNTER — Encounter: Payer: Self-pay | Admitting: Skilled Nursing Facility1

## 2018-05-21 DIAGNOSIS — I10 Essential (primary) hypertension: Secondary | ICD-10-CM

## 2018-05-21 DIAGNOSIS — Z9884 Bariatric surgery status: Secondary | ICD-10-CM | POA: Diagnosis not present

## 2018-05-21 DIAGNOSIS — Z713 Dietary counseling and surveillance: Secondary | ICD-10-CM | POA: Insufficient documentation

## 2018-05-21 NOTE — Progress Notes (Signed)
Post-Operative Sleeve Surgery  Primary concerns today: Post-operative Bariatric Surgery Nutrition Management.  Surgery date: 09/23/2017 Surgery type: Sleeve gastrectomy [facility] Weight today: 157.2 Weight change: maintanied  Pts strategies: Putting drinks on desk and making smoothies with veggies keep fridge full at work and brings 100 calories of nuts and snacks and popcorn and cranberry. Pt states she has a bowel movement Every other day which is the best it has ever been in her life.  Pt would like to follow up to ensure she is making good choices.  Pts body goal: To be lean/fit  Pt states she is really Not happy with her body and is extremely disappointed she cannot get the body type she wants which is thin/lean with her clavical showing. Dietitian educated the pt on genetics role in body shape and how one's body image is not coming from an appropriate place aka body image based on societal pressures. Pt states she has stopped working out because she did not get the body type she wanted. Pt state she stays very busy with work and home.  Pt was open to seeing a mental health professional and took the list of local providers.   TANITA  BODY COMP RESULTS  10/07/2017 11/13/2017 12/15/2017 01/22/2018 05/21/2018   BMI (kg/m^2) 31.5 30.1 29.1 29.2 28.8   Fat Mass (lbs) 71.4 67.4 61.6 62.8 62.4   Fat Free Mass (lbs) 95.4 97 97.4 96.8 94.8   Total Body Water (lbs) 67.4 68.6 68.4 68 66.6   24-hr recall: 2 granola bars and 3 handfuls peanuts in her pockets  B (AM): spinach banana with flax seed Snk (AM):  L (PM): rolled up deli meat Snk (PM): popcorn  D (PM): chicken wings or thigh Snk (PM): nuts   Fluid intake: water, lipton tea and tang:  2 8oz at night one at the gym and 1 32 oz  Estimated total protein intake: insufficient   Medications: see list Supplementation: multi and vitamin c viamine d calcium and biotin taking b12 (compared the doses to the bariatric  multivitamin)   Using straws: no Drinking while eating: no Having you been chewing well:no Chewing/swallowing difficulties: no Changes in vision: no Changes to mood/headaches: no Hair loss/Cahnges to skin/Changes to nails: no Any difficulty focusing or concentrating: no Sweating: no Dizziness/Lightheaded:  Palpitations: no  Carbonated beverages: no N/V/D/C/GAS: once a day with benefiber Abdominal Pain: no Dumping syndrome:   Recent physical activity:  3 days a week  60 minutes cardio and weights  Progress Towards Goal(s):  In progress.   Nutritional Diagnosis:  [place]-3.3 Overweight/obesity related to past poor dietary habits and physical inactivity as evidenced by patient w/ recent sleeve surgery following dietary guidelines for continued weight loss.  Intervention:  Nutrition counseling. Dietitian educated the pt on the importance/implications of not meeting her needs. Goals: -Make an appointment with a mental health professional  Teaching Method Utilized: Visual Auditory Hands on  Barriers to learning/adherence to lifestyle change: work schedule   Demonstrated degree of understanding via:  Teach Back   Monitoring/Evaluation:  Dietary intake, exercise, and body weight.

## 2018-09-10 ENCOUNTER — Encounter: Payer: Self-pay | Admitting: Internal Medicine

## 2018-09-10 ENCOUNTER — Ambulatory Visit: Payer: Managed Care, Other (non HMO) | Admitting: Internal Medicine

## 2018-09-10 VITALS — BP 122/78 | HR 100 | Temp 98.1°F | Ht 62.0 in | Wt 165.0 lb

## 2018-09-10 DIAGNOSIS — Z79899 Other long term (current) drug therapy: Secondary | ICD-10-CM | POA: Diagnosis not present

## 2018-09-10 DIAGNOSIS — Z5181 Encounter for therapeutic drug level monitoring: Secondary | ICD-10-CM

## 2018-09-10 DIAGNOSIS — I10 Essential (primary) hypertension: Secondary | ICD-10-CM

## 2018-09-10 DIAGNOSIS — R7309 Other abnormal glucose: Secondary | ICD-10-CM | POA: Diagnosis not present

## 2018-09-10 NOTE — Patient Instructions (Signed)

## 2018-09-10 NOTE — Progress Notes (Signed)
  Subjective:     Patient ID: Susan [name] , female    DOB: [birth year] , 47 y.o.   MRN: [id number]  HPI   Hypertension  This is a chronic problem. The current episode started more than 1 year ago. The problem has been gradually improving since onset. The problem is controlled. The current treatment provides moderate improvement.     Past Medical History:  Diagnosis Date  . Anemia 2016   with bleeding ulcer  . Arthritis    oa hands  . Dizziness   . GERD (gastroesophageal reflux disease)    no current meds  . GI bleeding   . Headache    headaches and migraines  . History of bleeding ulcers   . Hyperlipidemia    no cholesterol meds taken  . Hypertension   . Insomnia   . Iron deficiency   . Irregular periods   . Pre-diabetes   . Syncope       Current Outpatient Medications:  .  doxylamine, Sleep, (UNISOM) 25 MG tablet, Take 25 mg by mouth at bedtime as needed for sleep., Disp: , Rfl:  .  hydrochlorothiazide (MICROZIDE) 12.5 MG capsule, Take by mouth., Disp: , Rfl:  .  levonorgestrel (MIRENA, 52 MG,) 20 MCG/24HR IUD, 1 each by Intrauterine route once. Inserted 2017, Disp: , Rfl:  .  lisinopril-hydrochlorothiazide (PRINZIDE,ZESTORETIC) 10-12.5 MG tablet, Take 1 tablet by mouth daily., Disp: , Rfl:  .  Multiple Vitamin (MULTIVITAMIN) capsule, Take by mouth., Disp: , Rfl:    No Known Allergies   Review of Systems  Constitutional: Negative.   HENT: Negative.   Respiratory: Negative.   Cardiovascular: Negative.   Gastrointestinal: Negative.   Psychiatric/Behavioral: Negative.      Today's Vitals   09/10/18 1608  BP: 122/78  Pulse: 100  Temp: 98.1 F (36.7 C)  TempSrc: Oral  Weight: 165 lb (74.8 kg)  Height: '5\' 2"'$  (1.575 m)   Body mass index is 30.18 kg/m.   Objective:  Physical Exam  Constitutional: She is oriented to person, place, and time. She appears well-developed and well-nourished.  HENT:  Head: Normocephalic and atraumatic.  Eyes: EOM are normal.   Cardiovascular: Normal rate and regular rhythm.  Pulmonary/Chest: Effort normal and breath sounds normal.  Neurological: She is alert and oriented to person, place, and time.  Skin: Skin is warm and dry.  Psychiatric: She has a normal mood and affect.  Nursing note and vitals reviewed.       Assessment And Plan:     1. Essential hypertension, benign  WELL CONTROLLED. SHE WILL CONTINUE WITH CURRENT MEDS. SHE IS ENCOURAGED TO AVOID ADDING SALT TO HER FOODS.   - CMP14+EGFR - Hemoglobin A1c  2. Other abnormal glucose  HER A1C HAS BEEN ELEVATED IN THE PAST. I WILL CHECK AN A1C, BMET TODAY. SHE WAS ENCOURAGED TO AVOID SUGARY BEVERAGES AND PROCESSED FOODS INCLUDNG BREADS, RICE AND PASTA.  - Hemoglobin A1c  3. Encounter for monitoring antihypertensive use   Maximino Greenland, MD

## 2018-09-11 LAB — CMP14+EGFR
A/G RATIO: 1.7 (ref 1.2–2.2)
ALBUMIN: 4.7 g/dL (ref 3.5–5.5)
ALK PHOS: 58 IU/L (ref 39–117)
ALT: 9 IU/L (ref 0–32)
AST: 15 IU/L (ref 0–40)
BUN/Creatinine Ratio: 10 (ref 9–23)
BUN: 9 mg/dL (ref 6–24)
Bilirubin Total: 0.7 mg/dL (ref 0.0–1.2)
CO2: 24 mmol/L (ref 20–29)
CREATININE: 0.91 mg/dL (ref 0.57–1.00)
Calcium: 10.1 mg/dL (ref 8.7–10.2)
Chloride: 100 mmol/L (ref 96–106)
GFR calc Af Amer: 88 mL/min/{1.73_m2} (ref >59)
GFR calc non Af Amer: 76 mL/min/{1.73_m2} (ref >59)
GLOBULIN, TOTAL: 2.7 g/dL (ref 1.5–4.5)
Glucose: 83 mg/dL (ref 65–99)
POTASSIUM: 4.3 mmol/L (ref 3.5–5.2)
SODIUM: 139 mmol/L (ref 134–144)
Total Protein: 7.4 g/dL (ref 6.0–8.5)

## 2018-09-11 LAB — HEMOGLOBIN A1C
ESTIMATED AVERAGE GLUCOSE: 111 mg/dL
HEMOGLOBIN A1C: 5.5 % (ref 4.8–5.6)

## 2018-09-11 LAB — TSH: TSH: 1.14 u[IU]/mL (ref 0.450–4.500)

## 2018-09-12 NOTE — Progress Notes (Signed)
Your liver and kidney function are stable. Be sure to stay well hydrated . Your hba1c is 5.5, this is great. You are not prediabetic. Your thyroid function is normal.

## 2018-10-13 ENCOUNTER — Encounter: Payer: Managed Care, Other (non HMO) | Attending: Surgery | Admitting: Skilled Nursing Facility1

## 2018-10-13 DIAGNOSIS — E669 Obesity, unspecified: Secondary | ICD-10-CM

## 2018-10-15 ENCOUNTER — Encounter: Payer: Self-pay | Admitting: Skilled Nursing Facility1

## 2018-10-15 NOTE — Progress Notes (Signed)
Bariatric Class:  Appt start time: 6:00 end time: 7:00  12 Month Post-Operative Nutrition Class  Patient was seen on 10/15/2018 for Post-Operative Nutrition education at [facility].   Pt did not participate in class for herself but she did verbally offer support for the other pts. Pt seemed irriated by something and stated "she has an empty hoe" further describing she has no restriction and needs to talk with her surgeon. Pt left class before dietitian was able to get her to elaborate on her concerns.   Surgery date: 09/23/2017 Surgery type: Sleeve gastrectomy Start weight at [facility]: 168.8 Weight today: pt declined Weight change:  TANITA  BODY COMP RESULTS  N/A   BMI (kg/m^2)    Fat Mass (lbs)    Fat Free Mass (lbs)    Total Body Water (lbs)    The following the learning objectives were met by the patient during this course:  Review of TANITA scale information  Share and discuss bariatric surgery successes and non-scale victories  Identifies Phase VII (Maintenance Phase) Dietary Goals which will be lifelong  Identifies appropriate sources of fluids, proteins, non-starchy vegetables, and complex carbohydrates  Identifies well-balanced meals  Identifies portion control   Identifies appropriate multivitamin and calcium sources post-operatively  Describes the need for physical activity post-operatively and will follow MD recommendations  Identifies and describes SMART goals   Creates at least 2 SMART goals to begin immediately  States when to call healthcare provider regarding medication questions or post-operative complications  Handouts given during class include:  Phase VII: Maintenance Phase-Lifelong  Follow-Up Plan: Patient will follow-up at [facility] for on-going post-op nutrition visits.

## 2018-11-30 ENCOUNTER — Other Ambulatory Visit: Payer: Self-pay | Admitting: Internal Medicine

## 2018-12-15 LAB — HM MAMMOGRAPHY

## 2019-02-03 ENCOUNTER — Encounter (INDEPENDENT_AMBULATORY_CARE_PROVIDER_SITE_OTHER): Payer: Managed Care, Other (non HMO)

## 2019-02-03 ENCOUNTER — Other Ambulatory Visit: Payer: Self-pay

## 2019-02-04 ENCOUNTER — Encounter (INDEPENDENT_AMBULATORY_CARE_PROVIDER_SITE_OTHER): Payer: Managed Care, Other (non HMO)

## 2019-02-04 ENCOUNTER — Ambulatory Visit: Payer: Managed Care, Other (non HMO) | Admitting: Neurology

## 2019-02-04 ENCOUNTER — Other Ambulatory Visit: Payer: Self-pay

## 2019-02-04 ENCOUNTER — Encounter: Payer: Self-pay | Admitting: Neurology

## 2019-02-04 VITALS — BP 128/91 | HR 80 | Ht 61.0 in | Wt 179.0 lb

## 2019-02-04 DIAGNOSIS — G479 Sleep disorder, unspecified: Secondary | ICD-10-CM

## 2019-02-04 DIAGNOSIS — G4700 Insomnia, unspecified: Secondary | ICD-10-CM

## 2019-02-04 DIAGNOSIS — R351 Nocturia: Secondary | ICD-10-CM

## 2019-02-04 DIAGNOSIS — G4719 Other hypersomnia: Secondary | ICD-10-CM

## 2019-02-04 DIAGNOSIS — Z820 Family history of epilepsy and other diseases of the nervous system: Secondary | ICD-10-CM

## 2019-02-04 DIAGNOSIS — E669 Obesity, unspecified: Secondary | ICD-10-CM

## 2019-02-04 DIAGNOSIS — R0683 Snoring: Secondary | ICD-10-CM | POA: Diagnosis not present

## 2019-02-04 NOTE — Patient Instructions (Signed)

## 2019-02-04 NOTE — Progress Notes (Signed)
Subjective:    Patient ID: Susan [name] is a 48 y.o. female.  HPI     Huston Foley, MD, PhD [facility] [address], [address] [address] [place], [place] [address]  Dear Dr. Cherly Hensen,  I saw your patient, Susan [name], upon your kind request in the sleep clinic today for initial consultation of her sleep disorder, in particular, concern for underlying obstructive sleep apnea. The patient is unaccompanied today. As you know, Susan [name] is a 48 year old right-handed woman with an underlying medical history of reflux disease, arthritis, anemia, hypertension, hyperlipidemia, history of syncope, prediabetes, and obesity, who reports snoring and excessive daytime somnolence, nonrestorative sleep and difficulty maintaining sleep. I reviewed your office note from 12/15/2018, which you kindly included. She had lap band surgery in 2010, lost about 70+ pounds. She had revision of the form of gastric sleeve in 2018. She has gained weight. She is going to start with the medical weight management clinic. She denies any chest pain, nocturnal palpitations or shortness of breath, is a restless sleeper and tosses and turns a lot but denies any classic RLS symptoms. She reports that she gained about 40 pounds in the past 2 years. Her mother died at 40 from pancreatic cancer, she had OSA but was not on CPAP therapy. Patient reports nocturia about once per average night, denies morning headaches or nocturnal headaches. Per husband, her snoring is mild. She takes an over-the-counter sleep aid, was given a prescription for Ambien but has not filled it. She works as a Financial trader. Lives with her family which includes her husband, her 65 year old father and her 2 daughters. They have no pets. Epworth sleepiness score is 6 out of 24 today, fatigue score is 32 out of 63. She is a nonsmoker and drinks alcohol 2 or 3 times a week, no caffeine.   Her Past Medical History Is Significant  For: Past Medical History:  Diagnosis Date  . Anemia 2016   with bleeding ulcer  . Arthritis    oa hands  . Dizziness   . GERD (gastroesophageal reflux disease)    no current meds  . GI bleeding   . Headache    headaches and migraines  . History of bleeding ulcers   . Hyperlipidemia    no cholesterol meds taken  . Hypertension   . Insomnia   . Iron deficiency   . Irregular periods   . Pre-diabetes   . Syncope     Her Past Surgical History Is Significant For: Past Surgical History:  Procedure Laterality Date  . BILATERAL SALPINGECTOMY Bilateral 03/22/2013   Procedure: BILATERAL SALPINGECTOMY;  Surgeon: Serita Kyle, MD;  Location: [facility];  Service: Gynecology;  Laterality: Bilateral;  . CARPAL TUNNEL RELEASE Right 11/15/2013   Procedure: RIGHT CARPAL TUNNEL RELEASE;  Surgeon: Nicki Reaper, MD;  Location: [facility];  Service: Orthopedics;  Laterality: Right;  . COSMETIC SURGERY  01/2015   tummy tuck   . ESOPHAGOGASTRODUODENOSCOPY N/A 10/04/2015   Procedure: ESOPHAGOGASTRODUODENOSCOPY (EGD);  Surgeon: Ruffin Frederick, MD;  Location: [facility];  Service: Gastroenterology;  Laterality: N/A;  . GANGLION CYST EXCISION Right 11/15/2013   Procedure: EXCISION CYST RIGHT WRIST;  Surgeon: Nicki Reaper, MD;  Location: [facility];  Service: Orthopedics;  Laterality: Right;  . LAPAROSCOPIC GASTRIC BAND REMOVAL WITH LAPAROSCOPIC GASTRIC SLEEVE RESECTION N/A 09/23/2017   Procedure: LAPAROSCOPIC GASTRIC BAND REMOVAL WITH LAPAROSCOPIC GASTRIC SLEEVE RESECTION;  Surgeon: Luretha Murphy, MD;  Location: [facility];  Service: General;  Laterality: N/A;  . LAPAROSCOPIC GASTRIC BANDING  2010  . LAPAROSCOPY N/A 03/22/2013   Procedure: LAPAROSCOPY OPERATIVE;  Surgeon: Serita Kyle, MD;  Location: [facility];  Service: Gynecology;  Laterality: N/A;  . MOUTH SURGERY  1991  . SALPINGECTOMY  03/22/2013    Her Family History Is Significant For: Family  History  Problem Relation Age of Onset  . Cancer Mother        pancreatic  . Stroke Mother   . Diabetes Mother   . Hypertension Father   . Ulcers Father   . Hypertension Brother     Her Social History Is Significant For: Social History   Socioeconomic History  . Marital status: Married    Spouse name: Not on file  . Number of children: Not on file  . Years of education: Not on file  . Highest education level: Not on file  Occupational History  . Not on file  Social Needs  . Financial resource strain: Not on file  . Food insecurity:    Worry: Not on file    Inability: Not on file  . Transportation needs:    Medical: Not on file    Non-medical: Not on file  Tobacco Use  . Smoking status: Never Smoker  . Smokeless tobacco: Never Used  Substance and Sexual Activity  . Alcohol use: Yes    Alcohol/week: 1.0 standard drinks    Types: 1 Standard drinks or equivalent per week    Comment: socially  . Drug use: No  . Sexual activity: Not on file  Lifestyle  . Physical activity:    Days per week: Not on file    Minutes per session: Not on file  . Stress: Not on file  Relationships  . Social connections:    Talks on phone: Not on file    Gets together: Not on file    Attends religious service: Not on file    Active member of club or organization: Not on file    Attends meetings of clubs or organizations: Not on file    Relationship status: Not on file  Other Topics Concern  . Not on file  Social History Narrative  . Not on file    Her Allergies Are:  No Known Allergies:   Her Current Medications Are:  Outpatient Encounter Medications as of 02/04/2019  Medication Sig  . doxylamine, Sleep, (UNISOM) 25 MG tablet Take 25 mg by mouth at bedtime as needed for sleep.  [name] levonorgestrel (MIRENA, 52 MG,) 20 MCG/24HR IUD 1 each by Intrauterine route once. Inserted 2017  . lisinopril-hydrochlorothiazide (PRINZIDE,ZESTORETIC) 10-12.5 MG tablet TAKE 1 TABLET BY MOUTH DAILY  .  Multiple Vitamin (MULTIVITAMIN) capsule Take by mouth.  . [DISCONTINUED] hydrochlorothiazide (MICROZIDE) 12.5 MG capsule Take by mouth.   No facility-administered encounter medications on file as of 02/04/2019.   :  Review of Systems:  Out of a complete 14 point review of systems, all are reviewed and negative with the exception of these symptoms as listed below: Review of Systems  Neurological:       Pt presents today to discuss her sleep. Pt has never had a sleep study but does endorse occasional snoring. Pt has trouble sleeping. Pt takes an unknown OTC sleep aid. Pt was given Remus Loffler but is afraid to try it.  Epworth Sleepiness Scale 0= would never doze 1= slight chance of dozing 2= moderate chance of dozing 3= high chance of dozing  Sitting and  reading: 0 Watching TV: 3 Sitting inactive in a public place (ex. Theater or meeting): 0 As a passenger in a car for an hour without a break: 1 Lying down to rest in the afternoon: 1 Sitting and talking to someone: 0 Sitting quietly after lunch (no alcohol): 1 In a car, while stopped in traffic: 0 Total: 6     Objective:  Neurological Exam  Physical Exam Physical Examination:   Vitals:   02/04/19 1558  BP: (!) 128/91  Pulse: 80   General Examination: The patient is a very pleasant 48 y.o. female in no acute distress. She appears well-developed and well-nourished and well groomed.   HEENT: Normocephalic, atraumatic, pupils are equal, round and reactive to light and accommodation. Extraocular tracking is good without limitation to gaze excursion or nystagmus noted. Normal smooth pursuit is noted. Hearing is grossly intact. Face is symmetric with normal facial animation and normal facial sensation. Speech is clear with no dysarthria noted. There is no hypophonia. There is no lip, neck/head, jaw or voice tremor. Neck is supple with full range of passive and active motion. There are no carotid bruits on auscultation. Oropharynx exam  reveals: mild mouth dryness, good dental hygiene and mild airway crowding, due to tonsils of 1-2+ in size, mildly elongated uvula, Mallampati is class I, neck circumference is 15 inches. Tongue protrudes centrally and palate elevates symmetrically. She has a minimal overbite.  Chest: Clear to auscultation without wheezing, rhonchi or crackles noted.  Heart: S1+S2+0, regular and normal without murmurs, rubs or gallops noted.   Abdomen: Soft, non-tender and non-distended with normal bowel sounds appreciated on auscultation.  Extremities: There is no pitting edema in the distal lower extremities bilaterally.   Skin: Warm and dry without trophic changes noted.  Musculoskeletal: exam reveals no obvious joint deformities, tenderness or joint swelling or erythema.   Neurologically:  Mental status: The patient is awake, alert and oriented in all 4 spheres. Her immediate and remote memory, attention, language skills and fund of knowledge are appropriate. There is no evidence of aphasia, agnosia, apraxia or anomia. Speech is clear with normal prosody and enunciation. Thought process is linear. Mood is normal and affect is normal.  Cranial nerves II - XII are as described above under HEENT exam. In addition: shoulder shrug is normal with equal shoulder height noted. Motor exam: Normal bulk, strength and tone is noted. There is no tremor. Romberg is negative. Fine motor skills and coordination: intact grossly.   Cerebellar testing: No dysmetria or intention tremor. There is no truncal or gait ataxia.  Sensory exam: intact to light touch.  Gait, station and balance: She stands easily. No veering to one side is noted. No leaning to one side is noted. Posture is age-appropriate and stance is narrow based. Gait shows normal stride length and normal pace. No problems turning are noted. Tandem walk is unremarkable.   Assessment and Plan:   In summary, Susan [name] is a very pleasant 48 y.o.-year old female  with an underlying medical history of reflux disease, arthritis, anemia, hypertension, hyperlipidemia, history of syncope, prediabetes, and obesity, whose history and physical exam are concerning for obstructive sleep apnea (OSA). I had a long chat with the patient about my findings and the diagnosis of OSA, its prognosis and treatment options. We talked about medical treatments, surgical interventions and non-pharmacological approaches. I explained in particular the risks and ramifications of untreated moderate to severe OSA, especially with respect to developing cardiovascular disease down the Road, including congestive  heart failure, difficult to treat hypertension, cardiac arrhythmias, or stroke. Even type 2 diabetes has, in part, been linked to untreated OSA. Symptoms of untreated OSA include daytime sleepiness, memory problems, mood irritability and mood disorder such as depression and anxiety, lack of energy, as well as recurrent headaches, especially morning headaches. We talked about trying to maintain a healthy lifestyle in general, as well as the importance of weight control. I encouraged the patient to eat healthy, exercise daily and keep well hydrated, to keep a scheduled bedtime and wake time routine, to not skip any meals and eat healthy snacks in between meals. I advised the patient not to drive when feeling sleepy. I recommended the following at this time: sleep study with potential positive airway pressure titration. (We will score hypopneas at 4%).   I explained the sleep test procedure to the patient and also outlined possible surgical and non-surgical treatment options of OSA, including the use of a custom-made dental device (which would require a referral to a specialist dentist or oral surgeon), upper airway surgical options, such as pillar implants, radiofrequency surgery, tongue base surgery, and UPPP (which would involve a referral to an ENT surgeon) and the [facility] implantable  device. I also explained the CPAP treatment option to the patient, who indicated that she would be willing to try CPAP if the need arises. I explained the importance of being compliant with PAP treatment, not only for insurance purposes but primarily to improve Her symptoms, and for the patient's long term health benefit, including to reduce Her cardiovascular risks. I answered all her questions today and the patient was in agreement. I plan to see her back after the sleep study is completed and encouraged her to call with any interim questions, concerns, problems or updates.   Thank you very much for allowing me to participate in the care of this nice patient. If I can be of any further assistance to you please do not hesitate to call me at [phone].  Sincerely,   Huston Foley, MD, PhD

## 2019-02-12 ENCOUNTER — Encounter: Payer: Self-pay | Admitting: Internal Medicine

## 2019-02-16 ENCOUNTER — Encounter (INDEPENDENT_AMBULATORY_CARE_PROVIDER_SITE_OTHER): Payer: Self-pay

## 2019-02-16 ENCOUNTER — Ambulatory Visit (INDEPENDENT_AMBULATORY_CARE_PROVIDER_SITE_OTHER): Payer: Managed Care, Other (non HMO) | Admitting: Bariatrics

## 2019-02-25 ENCOUNTER — Ambulatory Visit (INDEPENDENT_AMBULATORY_CARE_PROVIDER_SITE_OTHER): Payer: Managed Care, Other (non HMO) | Admitting: Neurology

## 2019-02-25 ENCOUNTER — Other Ambulatory Visit: Payer: Self-pay

## 2019-02-25 DIAGNOSIS — R351 Nocturia: Secondary | ICD-10-CM

## 2019-02-25 DIAGNOSIS — G4700 Insomnia, unspecified: Secondary | ICD-10-CM

## 2019-02-25 DIAGNOSIS — G4719 Other hypersomnia: Secondary | ICD-10-CM

## 2019-02-25 DIAGNOSIS — G4710 Hypersomnia, unspecified: Secondary | ICD-10-CM

## 2019-02-25 DIAGNOSIS — Z820 Family history of epilepsy and other diseases of the nervous system: Secondary | ICD-10-CM

## 2019-02-25 DIAGNOSIS — G479 Sleep disorder, unspecified: Secondary | ICD-10-CM

## 2019-02-25 DIAGNOSIS — R0683 Snoring: Secondary | ICD-10-CM

## 2019-02-25 DIAGNOSIS — E669 Obesity, unspecified: Secondary | ICD-10-CM

## 2019-03-02 ENCOUNTER — Ambulatory Visit (INDEPENDENT_AMBULATORY_CARE_PROVIDER_SITE_OTHER): Payer: Managed Care, Other (non HMO) | Admitting: Bariatrics

## 2019-03-17 ENCOUNTER — Other Ambulatory Visit: Payer: Managed Care, Other (non HMO)

## 2019-03-18 ENCOUNTER — Ambulatory Visit (INDEPENDENT_AMBULATORY_CARE_PROVIDER_SITE_OTHER): Payer: Managed Care, Other (non HMO) | Admitting: Internal Medicine

## 2019-03-18 ENCOUNTER — Other Ambulatory Visit: Payer: Self-pay

## 2019-03-18 ENCOUNTER — Encounter: Payer: Self-pay | Admitting: Internal Medicine

## 2019-03-18 ENCOUNTER — Encounter: Payer: Managed Care, Other (non HMO) | Admitting: Internal Medicine

## 2019-03-18 VITALS — BP 128/94 | HR 80 | Temp 98.5°F | Ht 62.4 in | Wt 176.0 lb

## 2019-03-18 DIAGNOSIS — N951 Menopausal and female climacteric states: Secondary | ICD-10-CM | POA: Diagnosis not present

## 2019-03-18 DIAGNOSIS — I10 Essential (primary) hypertension: Secondary | ICD-10-CM

## 2019-03-18 DIAGNOSIS — Z0000 Encounter for general adult medical examination without abnormal findings: Secondary | ICD-10-CM | POA: Diagnosis not present

## 2019-03-18 LAB — POCT URINALYSIS DIPSTICK
Bilirubin, UA: NEGATIVE
Glucose, UA: NEGATIVE
Ketones, UA: NEGATIVE
Leukocytes, UA: NEGATIVE
Nitrite, UA: NEGATIVE
Protein, UA: NEGATIVE
Spec Grav, UA: 1.01 (ref 1.010–1.025)
Urobilinogen, UA: 0.2 E.U./dL
pH, UA: 5.5 (ref 5.0–8.0)

## 2019-03-18 LAB — POCT UA - MICROALBUMIN
Albumin/Creatinine Ratio, Urine, POC: 30
Creatinine, POC: 50 mg/dL
Microalbumin Ur, POC: 10 mg/L

## 2019-03-18 NOTE — Patient Instructions (Signed)

## 2019-03-18 NOTE — Progress Notes (Signed)
Subjective:     Patient ID: Susan [name] , female    DOB: June 30, 1971 , 48 y.o.   MRN: [id number]   Chief Complaint  Patient presents with  . Annual Exam  . Hypertension    HPI  She is here today for a full physical examination. She is followed by Dr. Garwin Brothers for her GYN care. She has a Corporate treasurer. She was last seen Dec 2019. Mammogram was performed as well.   Hypertension  This is a chronic problem. The current episode started more than 1 year ago. The problem has been gradually improving since onset. The problem is controlled. Past treatments include ACE inhibitors. The current treatment provides moderate improvement.     Past Medical History:  Diagnosis Date  . Anemia 2016   with bleeding ulcer  . Arthritis    oa hands  . Dizziness   . GERD (gastroesophageal reflux disease)    no current meds  . GI bleeding   . Headache    headaches and migraines  . History of bleeding ulcers   . Hyperlipidemia    no cholesterol meds taken  . Hypertension   . Insomnia   . Iron deficiency   . Irregular periods   . Pre-diabetes   . Syncope      Family History  Problem Relation Age of Onset  . Cancer Mother        pancreatic  . Stroke Mother   . Diabetes Mother   . Hypertension Father   . Ulcers Father   . Hypertension Brother      Current Outpatient Medications:  .  doxylamine, Sleep, (UNISOM) 25 MG tablet, Take 25 mg by mouth at bedtime as needed for sleep., Disp: , Rfl:  .  levonorgestrel (MIRENA, 52 MG,) 20 MCG/24HR IUD, 1 each by Intrauterine route once. Inserted 2017, Disp: , Rfl:  .  lisinopril-hydrochlorothiazide (PRINZIDE,ZESTORETIC) 10-12.5 MG tablet, TAKE 1 TABLET BY MOUTH DAILY, Disp: 30 tablet, Rfl: 4 .  Multiple Vitamin (MULTIVITAMIN) capsule, Take by mouth., Disp: , Rfl:    No Known Allergies    The patient states she uses IUD for birth control. Last LMP was No LMP recorded. (Menstrual status: IUD).. Negative for Dysmenorrhea Negative for: breast discharge,  breast lump(s), breast pain and breast self exam. Associated symptoms include abnormal vaginal bleeding. Pertinent negatives include abnormal bleeding (hematology), anxiety, decreased libido, depression, difficulty falling sleep, dyspareunia, history of infertility, nocturia, sexual dysfunction, sleep disturbances, urinary incontinence, urinary urgency, vaginal discharge and vaginal itching. Diet regular.The patient states her exercise level is   minimal.   . The patient's tobacco use is:  Social History   Tobacco Use  Smoking Status Never Smoker  Smokeless Tobacco Never Used  . She has been exposed to passive smoke. The patient's alcohol use is:  Social History   Substance and Sexual Activity  Alcohol Use Yes  . Alcohol/week: 1.0 standard drinks  . Types: 1 Standard drinks or equivalent per week   Comment: socially  . Additional information: Last pap Dec 2019, next one scheduled for Dec 2020.   Review of Systems  Constitutional: Negative.   HENT: Negative.   Eyes: Negative.   Respiratory: Negative.   Cardiovascular: Negative.   Gastrointestinal: Negative.   Endocrine: Negative.   Genitourinary: Negative.        She has noticed that she has started to have hot flashes. They are random. She is unable to identify triggers. Does not have regular cycles, has an IUD - Mirena.  Musculoskeletal: Negative.   Skin: Negative.   Allergic/Immunologic: Negative.   Neurological: Negative.   Hematological: Negative.   Psychiatric/Behavioral: Negative.      Today's Vitals   03/18/19 0838  BP: (!) 128/94  Pulse: 80  Temp: 98.5 F (36.9 C)  TempSrc: Oral  Weight: 176 lb (79.8 kg)  Height: 5' 2.4" (1.585 m)   Body mass index is 31.78 kg/m.   Objective:  Physical Exam Vitals signs and nursing note reviewed.  Constitutional:      Appearance: Normal appearance.  HENT:     Head: Normocephalic and atraumatic.     Right Ear: Tympanic membrane, ear canal and external ear normal.      Left Ear: Tympanic membrane, ear canal and external ear normal.     Nose: Nose normal.     Mouth/Throat:     Mouth: Mucous membranes are moist.     Pharynx: Oropharynx is clear.  Eyes:     Extraocular Movements: Extraocular movements intact.     Conjunctiva/sclera: Conjunctivae normal.     Pupils: Pupils are equal, round, and reactive to light.  Neck:     Musculoskeletal: Normal range of motion and neck supple.  Cardiovascular:     Rate and Rhythm: Normal rate and regular rhythm.     Pulses: Normal pulses.     Heart sounds: Normal heart sounds.  Pulmonary:     Effort: Pulmonary effort is normal.     Breath sounds: Normal breath sounds.  Chest:     Breasts:        Right: No swelling, bleeding, inverted nipple, mass or nipple discharge.        Left: Normal. No swelling, bleeding, inverted nipple, mass or nipple discharge.  Abdominal:     General: Abdomen is flat. Bowel sounds are normal.     Palpations: Abdomen is soft.  Genitourinary:    Comments: deferred Musculoskeletal: Normal range of motion.  Skin:    General: Skin is warm and dry.     Comments: Tattoo on abdomen  Neurological:     General: No focal deficit present.     Mental Status: She is alert and oriented to person, place, and time.  Psychiatric:        Mood and Affect: Mood normal.        Behavior: Behavior normal.         Assessment And Plan:     1. Routine general medical examination at health care facility  A full exam was performed. Importance of monthly self breast exams was discussed with the patient. PATIENT HAS BEEN ADVISED TO GET 30-45 MINUTES REGULAR EXERCISE NO LESS THAN FOUR TO FIVE DAYS PER WEEK - BOTH WEIGHTBEARING EXERCISES AND AEROBIC ARE RECOMMENDED.  SHE IS ADVISED TO FOLLOW A HEALTHY DIET WITH AT LEAST SIX FRUITS/VEGGIES PER DAY, DECREASE INTAKE OF RED MEAT, AND TO INCREASE FISH INTAKE TO TWO DAYS PER WEEK.  MEATS/FISH SHOULD NOT BE FRIED, BAKED OR BROILED IS PREFERABLE.  I SUGGEST WEARING SPF  50 SUNSCREEN ON EXPOSED PARTS AND ESPECIALLY WHEN IN THE DIRECT SUNLIGHT FOR AN EXTENDED PERIOD OF TIME.  PLEASE AVOID FAST FOOD RESTAURANTS AND INCREASE YOUR WATER INTAKE.  - CMP14+EGFR - CBC - Lipid panel - Hemoglobin A1c - Insulin, random(561) - Vitamin D (25 hydroxy)  2. Essential hypertension, benign  Fair control. She will continue with current medications for now.  EKG performed, no acute changes noted. She is encouraged to avoid adding salt to her foods. She will rto  in six months for re-evaluation.   - EKG 12-Lead  3. Female climacteric state  She is encouraged to follow a CLEAN diet. She is advised to track her symptoms to determine if she has any triggers. She is advised that alcohol can trigger these symptoms as well. She is encouraged to limit her alcohol intake to no more than 2 glasses of wine per day for no more than 5 days per week. Her symptoms may improve with increased intake of cruciferous veggies and decreased intake of processed foods. She will let me know if her symptoms persist.   Maximino Greenland, MD    THE PATIENT IS ENCOURAGED TO PRACTICE SOCIAL DISTANCING DUE TO THE COVID-19 PANDEMIC.

## 2019-03-19 LAB — LIPID PANEL
Chol/HDL Ratio: 5.7 ratio — ABNORMAL HIGH (ref 0.0–4.4)
Cholesterol, Total: 293 mg/dL — ABNORMAL HIGH (ref 100–199)
HDL: 51 mg/dL (ref >39)
LDL Calculated: 226 mg/dL — ABNORMAL HIGH (ref 0–99)
Triglycerides: 80 mg/dL (ref 0–149)
VLDL Cholesterol Cal: 16 mg/dL (ref 5–40)

## 2019-03-19 LAB — CMP14+EGFR
ALT: 10 IU/L (ref 0–32)
AST: 17 IU/L (ref 0–40)
Albumin/Globulin Ratio: 1.4 (ref 1.2–2.2)
Albumin: 4.5 g/dL (ref 3.8–4.8)
Alkaline Phosphatase: 68 IU/L (ref 39–117)
BUN/Creatinine Ratio: 9 (ref 9–23)
BUN: 9 mg/dL (ref 6–24)
Bilirubin Total: 0.5 mg/dL (ref 0.0–1.2)
CO2: 25 mmol/L (ref 20–29)
Calcium: 9.8 mg/dL (ref 8.7–10.2)
Chloride: 97 mmol/L (ref 96–106)
Creatinine, Ser: 1.01 mg/dL — ABNORMAL HIGH (ref 0.57–1.00)
GFR calc Af Amer: 77 mL/min/{1.73_m2} (ref >59)
GFR calc non Af Amer: 67 mL/min/{1.73_m2} (ref >59)
Globulin, Total: 3.3 g/dL (ref 1.5–4.5)
Glucose: 85 mg/dL (ref 65–99)
Potassium: 4.1 mmol/L (ref 3.5–5.2)
Sodium: 139 mmol/L (ref 134–144)
Total Protein: 7.8 g/dL (ref 6.0–8.5)

## 2019-03-19 LAB — CBC
Hematocrit: 41.7 % (ref 34.0–46.6)
Hemoglobin: 14.3 g/dL (ref 11.1–15.9)
MCH: 32.4 pg (ref 26.6–33.0)
MCHC: 34.3 g/dL (ref 31.5–35.7)
MCV: 95 fL (ref 79–97)
Platelets: 316 10*3/uL (ref 150–450)
RBC: 4.41 x10E6/uL (ref 3.77–5.28)
RDW: 12.4 % (ref 11.7–15.4)
WBC: 5.3 10*3/uL (ref 3.4–10.8)

## 2019-03-19 LAB — INSULIN, RANDOM: INSULIN: 7.7 u[IU]/mL (ref 2.6–24.9)

## 2019-03-19 LAB — VITAMIN D 25 HYDROXY (VIT D DEFICIENCY, FRACTURES): Vit D, 25-Hydroxy: 72.8 ng/mL (ref 30.0–100.0)

## 2019-03-19 LAB — HEMOGLOBIN A1C
Est. average glucose Bld gHb Est-mCnc: 114 mg/dL
Hgb A1c MFr Bld: 5.6 % (ref 4.8–5.6)

## 2019-03-30 ENCOUNTER — Telehealth: Payer: Self-pay

## 2019-03-30 NOTE — Telephone Encounter (Signed)
-----   Message from Huston Foley, MD sent at 03/30/2019  8:23 AM EDT ----- HST had not enough data. Please advise pt that we could try to get ins auth for an attended sleep study, when we start doing lab attended PSGs again. If she is agreeable, I will order PSG.

## 2019-03-30 NOTE — Progress Notes (Signed)
HST had not enough data. Please advise pt that we could try to get ins auth for an attended sleep study, when we start doing lab attended PSGs again. If she is agreeable, I will order PSG.

## 2019-03-30 NOTE — Telephone Encounter (Signed)
I called pt to discuss her sleep study results, no answer, left a message asking her to call me back. 

## 2019-03-30 NOTE — Procedures (Signed)
  Patient Information     First Name: Susan Last Name: [name] ID: [id number]  Birth Date: [birth year] Age: 48 Gender: Female  Referring Provider: Dorothyann Peng, MD BMI: 33.7 (W=178 lb, H=5' 1'')  Neck Circ.:  15 '' Epworth:  6/24   Sleep Study Information    Study Date: Feb 25, 2019 S/H/A Version: [phone] / 4.1.1531 / 35  History: 48 year old woman with a history of reflux disease, arthritis, anemia, hypertension, hyperlipidemia, history of syncope, prediabetes, and obesity, who reports snoring and excessive daytime somnolence.              Summary & Diagnosis:     Inconclusive  Recommendations:      This study is inconclusive due to lack of data. She may benefit from a laboratory attended sleep study, when it is safe to do so.  Electronically Signed: Huston Foley, MD                   Sleep Summary  Oxygen Saturation Statistics   Start Study Time: End Study Time: Total Recording Time: 8:20:03 PM 9:16:49 PM 0 h, 56 min  Total Sleep Time Inconclusive REM Detection 0 h, 5 min    Mean: N/A Minimum: N/A Maximum: N/A  Mean of Desaturations Nadirs (%):   N/A  Oxygen Desatur. %:   4-9 10-20 >20 Total  Events Number Total  0 0  0.0 0.0  0 0.0  0 0.0  Oxygen Saturation: <90 <=88 <85 <80 <70  Duration (minutes): Sleep % 0.0 0.0  0.0 0.0  0.0 0.0 0.0 0.0 0.0 0.0     Respiratory Indices      Total Events REM NREM All Night  pRDI: pAHI: ODI: pAHIc: % CSR: N/A N/A N/A N/A N/A N/A N/A N/A N/A       Pulse Rate Statistics during Sleep (BPM)      Mean: N/A Minimum: N/A Maximum: N/A    Indices are calculated using technically valid sleep time of  0 hrs, 5 min. Inconclusive AHI Central Note: Respiratory Indices, Oxygen Saturation, Pulse Rate Statistics = N/A when insufficient signals  Body Position Statistics  Position Supine Prone Right Left Non-Supine  Sleep (min) 5.8 0.0 0.0 0.0 0.0  Sleep % 99.7 0.0 0.0 0.0 0.0  pRDI N/A N/A  N/A N/A N/A  pAHI N/A N/A N/A N/A N/A  ODI N/A N/A N/A N/A N/A     Snoring Statistics Snoring Level (dB) >40 >50 >60 >70 >80 >Threshold (45)  Sleep (min) 3.3 2.2 1.4 0.0 0.0 2.9  Sleep % 56.9 37.9 24.0 0.0 0.0 50.0    Mean: 50 dB Sleep Stages Chart

## 2019-03-31 NOTE — Telephone Encounter (Signed)
I called pt again to discuss her sleep study results. No answer, left a message asking her to call me back. 

## 2019-04-06 NOTE — Telephone Encounter (Signed)
I called pt again to discuss her sleep study results. No answer, left a message asking her to call me back. This is my third unsuccessful attempt at reaching the pt. Will send her a [employer].

## 2019-05-03 ENCOUNTER — Ambulatory Visit (INDEPENDENT_AMBULATORY_CARE_PROVIDER_SITE_OTHER): Payer: Managed Care, Other (non HMO) | Admitting: Bariatrics

## 2019-05-04 ENCOUNTER — Ambulatory Visit (INDEPENDENT_AMBULATORY_CARE_PROVIDER_SITE_OTHER): Payer: Managed Care, Other (non HMO) | Admitting: Bariatrics

## 2019-05-04 ENCOUNTER — Other Ambulatory Visit: Payer: Self-pay

## 2019-05-04 ENCOUNTER — Encounter (INDEPENDENT_AMBULATORY_CARE_PROVIDER_SITE_OTHER): Payer: Self-pay | Admitting: Bariatrics

## 2019-05-04 VITALS — BP 124/85 | HR 87 | Temp 98.5°F | Ht 62.0 in | Wt 172.0 lb

## 2019-05-04 DIAGNOSIS — E66811 Obesity, class 1: Secondary | ICD-10-CM

## 2019-05-04 DIAGNOSIS — E669 Obesity, unspecified: Secondary | ICD-10-CM

## 2019-05-04 DIAGNOSIS — Z0289 Encounter for other administrative examinations: Secondary | ICD-10-CM

## 2019-05-04 DIAGNOSIS — E282 Polycystic ovarian syndrome: Secondary | ICD-10-CM | POA: Diagnosis not present

## 2019-05-04 DIAGNOSIS — I10 Essential (primary) hypertension: Secondary | ICD-10-CM | POA: Diagnosis not present

## 2019-05-04 DIAGNOSIS — R5383 Other fatigue: Secondary | ICD-10-CM | POA: Diagnosis not present

## 2019-05-04 DIAGNOSIS — E7800 Pure hypercholesterolemia, unspecified: Secondary | ICD-10-CM | POA: Diagnosis not present

## 2019-05-04 DIAGNOSIS — Z9189 Other specified personal risk factors, not elsewhere classified: Secondary | ICD-10-CM

## 2019-05-04 DIAGNOSIS — Z1331 Encounter for screening for depression: Secondary | ICD-10-CM

## 2019-05-04 DIAGNOSIS — Z9889 Other specified postprocedural states: Secondary | ICD-10-CM

## 2019-05-04 DIAGNOSIS — Z6831 Body mass index (BMI) 31.0-31.9, adult: Secondary | ICD-10-CM | POA: Diagnosis not present

## 2019-05-04 DIAGNOSIS — R0602 Shortness of breath: Secondary | ICD-10-CM | POA: Diagnosis not present

## 2019-05-05 LAB — T3: T3, Total: 108 ng/dL (ref 71–180)

## 2019-05-05 LAB — SPECIMEN STATUS REPORT

## 2019-05-05 LAB — T4, FREE: Free T4: 1.28 ng/dL (ref 0.82–1.77)

## 2019-05-05 LAB — TSH: TSH: 1.52 u[IU]/mL (ref 0.450–4.500)

## 2019-05-05 NOTE — Progress Notes (Signed)
Office: [phone]  /  Fax: [phone]   Dear Dr. Cherly [name],   Thank you for referring Susan [name] [name] to our clinic. The following note includes my evaluation and treatment recommendations.  HPI:   Chief Complaint: OBESITY    Susan [name] [name] has been referred by Susan [name] Better, MD for consultation regarding her obesity and obesity related comorbidities.    Susan [name] [name] (MR# [id number]) is a 48 y.o. female who presents on 05/04/2019 for obesity evaluation and treatment. Current BMI is Body mass index is 31.46 kg/m.[name] Susan [name] [name] has been struggling with her weight for many years and has been unsuccessful in either losing weight, maintaining weight loss, or reaching her healthy weight goal.     Susan [name] [name] attended our information session and states she is currently in the action stage of change and ready to dedicate time achieving and maintaining a healthier weight. Susan [name] [name] is interested in becoming our patient and working on intensive lifestyle modifications including (but not limited to) diet, exercise and weight loss.    Susan [name] [name] states her family eats meals together she thinks her family will eat healthier with her she struggles with family and or coworkers weight loss sabotage her desired weight loss is 32 lbs. 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 lbs. 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 She has no restriction.   Fatigue Susan [name] [name] feels her energy is lower than it  should be. This has worsened with weight gain and has not worsened recently. Susan [name] [name] admits to daytime somnolence and she admits to waking up still tired. Patient is at risk for obstructive sleep apnea. Susan [name] [name] has a history of symptoms of daytime fatigue, morning fatigue, morning headache and hypertension. Patient generally gets 4 or 5 hours of sleep per night, and states they generally have restless sleep. Snoring is present. Apneic episodes are not present. Epworth Sleepiness Score is 4  Dyspnea on exertion Susan [name] [name] notes increasing shortness of breath with certain activities and seems to be worsening over time with weight gain. She notes getting out of breath sooner with activity than she used to. This has not gotten worse recently. Susan [name] [name] denies orthopnea.  Hypertension Susan [name] [name] is a 48 y.o. female with hypertension. She is taking Lisinopril-HCTZ. Susan [name] [name] denies chest pain. She is working weight loss to help control her blood pressure with the goal of decreasing her risk of heart attack and stroke. Susan [name] [name] blood pressure is well controlled.  Hypercholesterolemia Susan [name] [name] has hypercholesterolemia and she is not on medications. Her total cholesterol was at 293 and LDL was at 226 on 03/18/19. She is attempting to improve her cholesterol levels with intensive lifestyle modification including a low saturated fat diet, exercise and weight loss. She denies myalgias.  At risk for cardiovascular disease Susan [name] [name] is at a higher than average risk for cardiovascular disease due to obesity, hypertension and hypercholesterolemia. She currently denies any chest pain.  PCOS (polycystic ovarian syndrome) Susan [name] [name] has a diagnosis of polycystic ovarian syndrome and she is not on medications.  Depression with emotional  eating behaviors Susan [name] [name] is struggling with emotional eating and using food for comfort to the extent that it is negatively impacting her health. She has nighttime eating behaviors. She  often snacks when she is not hungry. Susan [name] [name] sometimes feels she is out of control and then feels guilty that she made poor food choices. She is attempting to work on behavior modification techniques to help reduce her emotional eating. She shows no sign of suicidal or homicidal ideations. PHQ Score is 10  Depression Screen Susan [name] [name]'s Food and Mood (modified PHQ-9) score was  Depression screen PHQ 2/9 05/04/2019  Decreased Interest 1  Down, Depressed, Hopeless 1  PHQ - 2 Score 2  Altered sleeping 1  Tired, decreased energy 1  Change in appetite 2  Feeling bad or failure about yourself  2  Trouble concentrating 1  Moving slowly or fidgety/restless 1  Suicidal thoughts 0  PHQ-9 Score 10  Difficult doing work/chores Somewhat difficult    ASSESSMENT AND PLAN:  Other fatigue - Plan: EKG 12-Lead, T3, T4, free, TSH  Essential hypertension  Hypercholesteremia  PCOS (polycystic ovarian syndrome)  Shortness of breath on exertion  Depression screening  Status post gastric surgery  At risk for heart disease  Class 1 obesity with serious comorbidity and body mass index (BMI) of 31.0 to 31.9 in adult, unspecified obesity type  PLAN:  Fatigue Susan [name] was informed that her fatigue may be related to obesity, depression or many other causes. Labs will be ordered, and in the meanwhile Susan [name] [name] has agreed to work on diet, exercise and weight loss to help with fatigue. Proper sleep hygiene was discussed including the need for 7-8 hours of quality sleep each night. A sleep study was not ordered based on symptoms and Epworth score.  Dyspnea on exertion Susan [name] [name]'s shortness of breath appears to be obesity related and exercise induced. She has agreed to work on weight loss and gradually increase exercise to treat her exercise induced shortness of breath. If Susan [name] [name] follows our instructions and loses weight without improvement of her shortness of breath, we will plan to refer to pulmonology. We will  monitor this condition regularly. Susan [name] agrees to this plan.  Hypertension We discussed sodium restriction, working on healthy weight loss, and a regular exercise program as the means to achieve improved blood pressure control. Susan [name] [name] agreed with this plan and agreed to follow up as directed. We will continue to monitor her blood pressure as well as her progress with the above lifestyle modifications. She will continue her medications as prescribed and will watch for signs of hypotension as she continues her lifestyle modifications.  Hypercholesterolemia Susan [name] [name] was informed of the American Heart Association Guidelines emphasizing intensive lifestyle modifications as the first line treatment for hypercholesterolemia. We discussed many lifestyle modifications today in depth, and Susan [name] [name] will work on decreasing saturated fats such as fatty red meat, butter and many fried foods. She will also increase MUFA's, PUFA's, vegetables and lean protein in her diet and work on exercise and weight loss efforts.  Cardiovascular risk counseling Susan [name] [name] was given extended (15 minutes) coronary artery disease prevention counseling today. She is 48 y.o. female and has risk factors for heart disease including obesity, hypertension and hypercholesterolemia. We discussed intensive lifestyle modifications today with an emphasis on specific weight loss instructions and strategies. Pt was also informed of the importance of increasing exercise and decreasing saturated fats to help prevent heart disease.  PCOS (polycystic ovarian syndrome) Susan [name] [name] will work on weight loss and gradually increase exercise. She will  follow up with our clinic in 2 weeks.  Depression with Emotional Eating Behaviors We discussed behavior modification techniques today to help Susan [name] [name] deal with her emotional eating and depression. We will refer Susan [name] [name] to Dr. Dewaine CongerBarker our bariatric psychologist.  Depression Screen Susan [name] [name] had a moderately positive  depression screening. Depression is commonly associated with obesity and often results in emotional eating behaviors. We will monitor this closely and work on CBT to help improve the non-hunger eating patterns.   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 She will work on increasing lean protein in her diet.  Obesity Susan [name] [name] is currently in the action stage of change and her goal is to continue with weight loss efforts. I recommend Susan [name] [name] begin the structured treatment plan as follows:  She has agreed to follow the category 2 plan  Susan [name] [name] has been instructed to eventually work up to a goal of 150 minutes of combined cardio and strengthening exercise per week for weight loss and overall health benefits. We discussed the following Behavioral Modification Strategies today: planning for success, increase H2O intake, no skipping meals, keeping healthy foods in the home, [name] snacking choices, increasing lean protein intake, decreasing simple carbohydrates, increasing vegetables, decrease eating out, work on meal planning and easy cooking plans, emotional eating strategies, ways to avoid boredom eating and ways to avoid night time snacking Handouts for "Smart Fruit" and 100 calorie snacks, were provided to patient today.   She was informed of the importance of frequent follow up visits to maximize her success with intensive lifestyle modifications for her multiple health conditions. She was informed we would discuss her lab results at her next visit unless there is a critical issue that needs to be addressed sooner. Susan [name] [name] agreed to keep her next visit at the agreed upon time to discuss these results.  ALLERGIES: No Known Allergies  MEDICATIONS: Current Outpatient Medications on File Prior to Visit  Medication Sig Dispense Refill  . levonorgestrel (MIRENA, 52 MG,) 20 MCG/24HR IUD 1 each by Intrauterine route once. Inserted  2017     . lisinopril-hydrochlorothiazide (PRINZIDE,ZESTORETIC) 10-12.5 MG tablet TAKE 1 TABLET BY MOUTH DAILY 30 tablet 4  . Multiple Vitamin (MULTIVITAMIN) capsule Take by mouth.     No current facility-administered medications on file prior to visit.     PAST MEDICAL HISTORY: Past Medical History:  Diagnosis Date  . Anemia 2016   with bleeding ulcer  . Arthritis    oa hands  . Constipation   . Dizziness   . GERD (gastroesophageal reflux disease)    no current meds  . GI bleeding   . Glaucoma   . Headache    headaches and migraines  . History of bleeding ulcers   . History of stomach ulcers   . Hyperlipidemia    no cholesterol meds taken  . Hypertension   . Hypothyroidism   . Insomnia   . Iron deficiency   . Irregular periods   . Osteoarthritis of both hands   . PCOS (polycystic ovarian syndrome)   . Pre-diabetes   . Syncope   . Vitamin D deficiency     PAST SURGICAL HISTORY: Past Surgical History:  Procedure Laterality Date  . BILATERAL SALPINGECTOMY Bilateral 03/22/2013   Procedure: BILATERAL SALPINGECTOMY;  Surgeon: Serita KyleSheronette A Cousins, MD;  Location: [facility];  Service: Gynecology;  Laterality: Bilateral;  . CARPAL TUNNEL RELEASE Right 11/15/2013   Procedure: RIGHT CARPAL TUNNEL RELEASE;  Surgeon: Nicki ReaperGary R Kuzma, MD;  Location: [facility];  Service: Orthopedics;  Laterality: Right;  . COSMETIC SURGERY  01/2015   tummy tuck   . ESOPHAGOGASTRODUODENOSCOPY N/A 10/04/2015   Procedure: ESOPHAGOGASTRODUODENOSCOPY (EGD);  Surgeon: Ruffin FrederickSteven Paul Armbruster, MD;  Location: [facility];  Service: Gastroenterology;  Laterality: N/A;  . GANGLION CYST EXCISION Right 11/15/2013   Procedure: EXCISION CYST RIGHT WRIST;  Surgeon: Nicki ReaperGary R Kuzma, MD;  Location: [facility];  Service: Orthopedics;  Laterality: Right;  . LAPAROSCOPIC GASTRIC BAND REMOVAL WITH LAPAROSCOPIC GASTRIC SLEEVE RESECTION N/A 09/23/2017   Procedure: LAPAROSCOPIC GASTRIC BAND REMOVAL  WITH LAPAROSCOPIC GASTRIC SLEEVE RESECTION;  Surgeon: Luretha MurphyMartin, Matthew, MD;  Location: [facility];  Service: General;  Laterality: N/A;  . LAPAROSCOPIC GASTRIC BANDING  2010  . LAPAROSCOPY N/A 03/22/2013   Procedure: LAPAROSCOPY OPERATIVE;  Surgeon: Serita KyleSheronette A Cousins, MD;  Location: [facility];  Service: Gynecology;  Laterality: N/A;  . MOUTH SURGERY  1991  . SALPINGECTOMY  03/22/2013    SOCIAL HISTORY: Social History   Tobacco Use  . Smoking status: Never Smoker  . Smokeless tobacco: Never Used  Substance Use Topics  . Alcohol use: Yes    Alcohol/week: 1.0 standard drinks    Types: 1 Standard drinks or equivalent per week    Comment: socially  . Drug use: No    FAMILY HISTORY: Family History  Problem Relation Age of Onset  . Cancer Mother        pancreatic  . Stroke Mother   . Diabetes Mother   . Diabetes gravidarum Mother   . High blood pressure Mother   . Heart disease Mother   . Obesity Mother   . Hypertension Father   . Ulcers Father   . Diabetes Father   . High blood pressure Father   . Heart disease Father   . Thyroid disease Father   . Hypertension Brother     ROS: Review of Systems  Constitutional: Positive for malaise/fatigue.  HENT: Positive for hearing loss.        + Mouth Sores  Eyes:       + Vision Changes + Wear Glasses or Contacts + Blurry or Double Vision + Flashes of Light  Respiratory: Positive for shortness of breath (on exertion).   Cardiovascular: Negative for chest pain and orthopnea.  Gastrointestinal: Positive for constipation.  Musculoskeletal: Negative for myalgias.       + Muscle or Joint Pain + Muscle Stiffness + Neck Stiffness  Skin: Positive for itching.       + Hair or Nail Changes  Neurological: Positive for headaches.  Psychiatric/Behavioral:       + Stress    PHYSICAL EXAM: Blood pressure 124/85, pulse 87, temperature 98.5 F (36.9 C), temperature source Oral, height 5\' 2"  (1.575 m), weight 172 lb (78 kg), SpO2 99 %.  Body mass index is 31.46 kg/m. Physical Exam Vitals signs reviewed.  Constitutional:      Appearance: Normal appearance. She is well-developed. She is obese.  HENT:     Head: Normocephalic and atraumatic.     Nose: Nose normal.  Eyes:     General: No scleral icterus.    Extraocular Movements: Extraocular movements intact.     Right eye: Normal extraocular motion.     Left eye: Normal extraocular motion.  Neck:     Musculoskeletal: Normal range of motion and neck supple.     Thyroid: No thyromegaly.  Cardiovascular:     Rate and Rhythm: Normal rate and regular rhythm.  Pulmonary:     Effort: Pulmonary effort is normal. No respiratory distress.  Abdominal:     Palpations: Abdomen is soft.     Tenderness: There is no abdominal tenderness.  Musculoskeletal: Normal range of motion.     Comments: Range of Motion normal in all 4 extremities  Skin:    General: Skin is warm and dry.  Neurological:     Mental Status: She is alert and oriented to person, place, and time.     Coordination: Coordination normal.  Psychiatric:        Mood and Affect: Mood normal.        Behavior: Behavior normal.     RECENT LABS AND TESTS: BMET    Component Value Date/Time   NA 139 03/18/2019 1118   K 4.1 03/18/2019 1118   CL 97 03/18/2019 1118   CO2 25 03/18/2019 1118   GLUCOSE 85 03/18/2019 1118   GLUCOSE 99 09/16/2017 1348   BUN 9 03/18/2019 1118   CREATININE 1.01 (H) 03/18/2019 1118   CALCIUM 9.8 03/18/2019 1118   GFRNONAA 67 03/18/2019 1118   GFRAA 77 03/18/2019 1118   Lab Results  Component Value Date   HGBA1C 5.6 03/18/2019   Lab Results  Component Value Date   INSULIN 7.7 03/18/2019   CBC    Component Value Date/Time   WBC 5.3 03/18/2019 1118   WBC 13.7 (H) 09/24/2017 0538   RBC 4.41 03/18/2019 1118   RBC 3.56 (L) 09/24/2017 0538   HGB 14.3 03/18/2019 1118   HCT 41.7 03/18/2019 1118   PLT 316 03/18/2019 1118   MCV 95 03/18/2019 1118   MCH 32.4 03/18/2019 1118   MCH  30.1 09/24/2017 0538   MCHC 34.3 03/18/2019 1118   MCHC 32.9 09/24/2017 0538   RDW 12.4 03/18/2019 1118   LYMPHSABS 1.5 09/24/2017 0538   MONOABS 0.6 09/24/2017 0538   EOSABS 0.0 09/24/2017 0538   BASOSABS 0.0 09/24/2017 0538   Iron/TIBC/Ferritin/ %Sat    Component Value Date/Time   IRON 114 10/03/2015 1908   TIBC 329 10/03/2015 1908   FERRITIN 15 10/03/2015 1908   IRONPCTSAT 35 (H) 10/03/2015 1908   Lipid Panel     Component Value Date/Time   CHOL 293 (H) 03/18/2019 1118   TRIG 80 03/18/2019 1118   HDL 51 03/18/2019 1118   CHOLHDL 5.7 (H) 03/18/2019 1118   LDLCALC 226 (H) 03/18/2019 1118   Hepatic Function Panel     Component Value Date/Time   PROT 7.8 03/18/2019 1118   ALBUMIN 4.5 03/18/2019 1118   AST 17 03/18/2019 1118   ALT 10 03/18/2019 1118   ALKPHOS 68 03/18/2019 1118   BILITOT 0.5 03/18/2019 1118   BILIDIR <0.1 (L) 09/23/2017 1146   IBILI NOT CALCULATED 09/23/2017 1146      Component Value Date/Time   TSH 1.520 05/04/2019 0000   TSH 1.140 09/10/2018 1641    ECG  shows NSR with a rate of 78 BPM INDIRECT CALORIMETER done today shows a VO2 of 213 and a REE of 1486.  Her calculated basal metabolic rate is 1610 thus her basal metabolic rate is [name] than expected.    OBESITY BEHAVIORAL INTERVENTION VISIT  Today's visit was # 1   Starting weight: 172 lbs Starting date: 05/04/2019 Today's weight : 172 lbs Today's date: 05/04/2019 Total lbs lost to date: 0    05/04/2019  Height  (1.575 m)  Weight 172 lb (78 kg)  BMI (Calculated) 31.45  BLOOD PRESSURE - SYSTOLIC  124  BLOOD PRESSURE - DIASTOLIC 85  Waist Measurement  35 inches   Body Fat % 37.2 %  Total Body Water (lbs) 71.6 lbs  RMR 1486    ASK: We discussed the diagnosis of obesity with Mike Gip today and Mikele agreed to give [place] permission to discuss obesity behavioral modification therapy today.  ASSESS: Jasara has the diagnosis of obesity and her BMI today is 82.99 Kaysee is in  the action stage of change   ADVISE: Tonyetta was educated on the multiple health risks of obesity as well as the benefit of weight loss to improve her health. She was advised of the need for long term treatment and the importance of lifestyle modifications to improve her current health and to decrease her risk of future health problems.  AGREE: Multiple dietary modification options and treatment options were discussed and  Jerris agreed to follow the recommendations documented in the above note.  ARRANGE: Shina was educated on the importance of frequent visits to treat obesity as outlined per CMS and USPSTF guidelines and agreed to schedule her next follow up appointment today.  Corey Skains, am acting as Location manager for [employer]. Owens Shark, DO  I have reviewed the above documentation for accuracy and completeness, and I agree with the above. -Jearld Lesch, DO

## 2019-05-13 ENCOUNTER — Ambulatory Visit (INDEPENDENT_AMBULATORY_CARE_PROVIDER_SITE_OTHER): Payer: Managed Care, Other (non HMO) | Admitting: Psychology

## 2019-05-13 ENCOUNTER — Telehealth (INDEPENDENT_AMBULATORY_CARE_PROVIDER_SITE_OTHER): Payer: Self-pay | Admitting: Psychology

## 2019-05-13 ENCOUNTER — Encounter (INDEPENDENT_AMBULATORY_CARE_PROVIDER_SITE_OTHER): Payer: Self-pay

## 2019-05-13 NOTE — Telephone Encounter (Signed)
  Office: [phone]  /  Fax: [phone]  Date of Call: May 13, 2019  Time of Call: 12:02pm Provider: Glennie Isle, PsyD  CONTENT: This provider called Susan [name] to check-in as she did not present for today's Webex appointment at 12:00pm. A HIPAA compliant voicemail was left requesting a call back. Of note, this provider stayed on the [facility] appointment for 10 minutes prior to signing off.   PLAN: This provider will wait for Susan [name] to call back. If deemed necessary, this provider or the provider's clinic will call Susan [name] again in approximately one week.

## 2019-05-13 NOTE — Progress Notes (Unsigned)
Office: [phone]  /  Fax: [phone]    Date: May 13, 2019    Appointment Start Time:*** Duration:*** Provider: Glennie Isle, Psy.D. Type of Session: Intake for Individual Therapy  Location of Patient: *** Location of Provider: Provider's Home Type of Contact: Telepsychological Visit via [employer] WebEx  Informed Consent: Prior to proceeding with today's appointment, two pieces of identifying information were obtained from Susan Susan [name] to verify identity. In addition, Susan Susan [name]'s physical location at the time of this appointment was obtained. Susan Susan [name] reported she was at *** and provided the address. In the event of technical difficulties, Susan Susan [name] shared a phone number she could be reached at. Susan Susan [name] and this provider participated in today's telepsychological service. Also, Susan Susan [name] denied anyone else being present in the room or on the WebEx appointment***.   The provider's role was explained to Susan Susan [name]. The provider reviewed and discussed issues of confidentiality, privacy, and limits therein (e.g., reporting obligations). In addition to verbal informed consent, written informed consent for psychological services was obtained from Susan Susan [name] prior to the initial intake interview. Written consent included information concerning the practice, financial arrangements, and confidentiality and patients' rights. Since the clinic is not a 24/7 crisis [name], mental health emergency resources were shared, and the provider explained MyChart, e-mail, voicemail, and/or other messaging systems should be utilized only for non-emergency reasons. This provider also explained that information obtained during appointments will be placed in Susan Susan [name]'s medical record in a confidential manner and relevant information will be shared with other providers at [name] that she meets with for coordination of care. Susan Susan [name] verbally acknowledged understanding of the aforementioned, and agreed to use mental  health emergency resources discussed if needed. Moreover, Susan Susan [name] agreed information may be shared with other [name] providers as needed for coordination of care. By signing the service agreement document, Susan Susan [name] provided written consent for coordination of care.   Prior to initiating telepsychological services, Susan Susan [name] was provided with an informed consent document, which included the development of a safety plan (i.e., an emergency contact and emergency resources) in the event of an emergency/crisis. Susan Susan [name] expressed understanding of the rationale of the safety plan and provided consent for this provider to reach out to her emergency contact in the event of an emergency/crisis.Susan Susan [name] returned the completed consent form prior to today's appointment. This provider verbally reviewed the consent form during today's appointment prior to proceeding with the appointment. Susan Susan [name] verbally acknowledged understanding that she is ultimately responsible for understanding her insurance benefits as it relates to reimbursement of telepsychological and in-person services. This provider also reviewed confidentiality, as it relates to telepsychological services, as well as the rationale for telepsychological services. More specifically, this provider's clinic is limiting in-person visits due to COVID-19. Therapeutic services will resume to in-person appointments once deemed appropriate. Susan Susan [name] expressed understanding regarding the rationale for telepsychological services. In addition, this provider explained the telepsychological services informed consent document would be considered an addendum to the initial consent document/service agreement. Susan Susan [name] verbally consented to proceed.   Chief Complaint/HPI: Susan Susan [name] was referred by Susan Susan [name] due to depression with emotional eating behaviors. Per the note for the visit with Susan Susan [name] on May 04, 8755, "Susan Susan [name] is struggling with emotional eating  and using food for comfort to the extent that it is negatively impacting her health. She has nighttime eating behaviors. She often snacks when she is not hungry. Susan Susan [name] sometimes feels she is out of control and then feels guilty that she made poor food choices.  She is attempting to work on behavior modification techniques to help reduce her emotional eating. She shows no sign of suicidal or homicidal ideations. PHQ Score is 10." During the initial appointment with Susan Susan [name] at [name] on May 03, 9389, Susan Susan [name] reported experiencing the following: frequently drinking liquids with calories, frequently making poor food choices, binge eating behaviors, struggling with emotional eating, waking up frquently in the middle of the night to eat, snacking after dinner and skipping breakfast. The note further indicated, "Susan Susan [name] previously had lap band in October 2018. She has no restriction."  During today's appointment, Susan Susan [name] reported ***. Susan Susan [name] was verbally administered a questionnaire assessing various behaviors related to emotional eating. Susan Susan [name] endorsed the following: {gbmoodandfood:21755}. She shared she craves the following: ***.   Moreover, Susan Susan [name] indicated *** triggers emotional eating, whereas *** makes emotional eating better. Furthermore, Susan Susan [name] {ZESPQZR:00762} others problems of concern. ***   Mental Status Examination:  Appearance: {Appearance:22431} Behavior: {Behavior:22445} Mood: {Teletherapy mood:22435} Affect: {Affect:22436} Speech: {Speech:22432} Eye Contact: {Eye Contact:22433} Psychomotor Activity: {Motor Activity:22434} Thought Process: {thought process:22448}  Content/Perceptual Disturbances: {disturbances:22451} Orientation: {Orientation:22437} Cognition/Sensorium: {gbcognition:22449} Insight: {Insight:22446} Judgment: {Insight:22446}  Family & Psychosocial History: Susan Susan [name] reported she is ***. She indicated she is currently ***. Additionally,  Susan Susan [name] shared her highest level of education obtained is ***. Currently, Susan Susan [name]'s social support Susan [name] consists of ***. Moreover, Susan Susan [name] stated she resides with ***.   Medical History:  Past Medical History:  Diagnosis Date   Anemia 2016   with bleeding ulcer   Arthritis    oa hands   Constipation    Dizziness    GERD (gastroesophageal reflux disease)    no current meds   GI bleeding    Glaucoma    Headache    headaches and migraines   History of bleeding ulcers    History of stomach ulcers    Hyperlipidemia    no cholesterol meds taken   Hypertension    Hypothyroidism    Insomnia    Iron deficiency    Irregular periods    Osteoarthritis of both hands    PCOS (polycystic ovarian syndrome)    Pre-diabetes    Syncope    Vitamin D deficiency    Past Surgical History:  Procedure Laterality Date   BILATERAL SALPINGECTOMY Bilateral 03/22/2013   Procedure: BILATERAL SALPINGECTOMY;  Surgeon: Marvene Staff, MD;  Location: [facility];  Service: Gynecology;  Laterality: Bilateral;   CARPAL TUNNEL RELEASE Right 11/15/2013   Procedure: RIGHT CARPAL TUNNEL RELEASE;  Surgeon: Wynonia Sours, MD;  Location: [facility];  Service: Orthopedics;  Laterality: Right;   COSMETIC SURGERY  01/2015   tummy tuck    ESOPHAGOGASTRODUODENOSCOPY N/A 10/04/2015   Procedure: ESOPHAGOGASTRODUODENOSCOPY (EGD);  Surgeon: Manus Gunning, MD;  Location: [facility];  Service: Gastroenterology;  Laterality: N/A;   GANGLION CYST EXCISION Right 11/15/2013   Procedure: EXCISION CYST RIGHT WRIST;  Surgeon: Wynonia Sours, MD;  Location: [facility];  Service: Orthopedics;  Laterality: Right;   LAPAROSCOPIC GASTRIC BAND REMOVAL WITH LAPAROSCOPIC GASTRIC SLEEVE RESECTION N/A 09/23/2017   Procedure: LAPAROSCOPIC GASTRIC BAND REMOVAL WITH LAPAROSCOPIC GASTRIC SLEEVE RESECTION;  Surgeon: Johnathan Hausen, MD;  Location: [facility];  Service: General;   Laterality: N/A;   LAPAROSCOPIC GASTRIC BANDING  2010   LAPAROSCOPY N/A 03/22/2013   Procedure: LAPAROSCOPY OPERATIVE;  Surgeon: Marvene Staff, MD;  Location: [facility];  Service: Gynecology;  Laterality: N/A;   [facility]   SALPINGECTOMY  03/22/2013   Current Outpatient Medications on File Prior to Visit  Medication Sig Dispense Refill   levonorgestrel (MIRENA, 52 MG,) 20 MCG/24HR IUD 1 each by Intrauterine route once. Inserted 2017      lisinopril-hydrochlorothiazide (PRINZIDE,ZESTORETIC) 10-12.5 MG tablet TAKE 1 TABLET BY MOUTH DAILY 30 tablet 4   Multiple Vitamin (MULTIVITAMIN) capsule Take by mouth.     No current facility-administered medications on file prior to visit.     Mental Health History: Adanely {TMAUQJF:35456} a history of mental health treatment, including therapeutic services. Priyana denied a history of hospitalizations for psychiatric concerns, and has never met with a psychiatrist.*** Regarding psychotropic medications, Asna stated ***. Lisset {YBWLSLH:73428} a family history of mental health related concerns. *** Merrit denied a trauma history, including {gbtrauma:22071} abuse, as well as neglect. ***  Analyse described her typical mood as ***. Aside from concerns noted above and endorsed on the PHQ-9 and GAD-7, Nakiah reported ***. Mikayla {JGOTLXB:26203} current alcohol use. *** She {gblegal:22371} tobacco use. *** She {TDHRCBU:38453} illicit/recreational substance use. Regarding caffeine intake, Shirlyn reported ***. Furthermore, Hadasah denied experiencing the following: {gbsxs:21965}. She also denied history of and current suicidal ideation, plan, and intent; history of and current homicidal ideation, plan, and intent; and history of and current engagement in self-harm.  The following strengths were reported by [name]:*** The following strengths were observed by this provider: {gbstrengths:22223}.  Legal History: Adya {MIWOEHO:12248} a  history of legal involvement.   Structured Assessment Results: The Patient Health Questionnaire-9 (PHQ-9) is a self-report measure that assesses symptoms and severity of depression over the course of the last two weeks. Wave obtained a score of *** suggesting {GBPHQ9SEVERITY:21752}. Mida finds the endorsed symptoms to be {gbphq9difficulty:21754}. Little interest or pleasure in doing things ***  Feeling down, depressed, or hopeless ***  Trouble falling or staying asleep, or sleeping too much ***  Feeling tired or having little energy ***  Poor appetite or overeating ***  Feeling bad about yourself --- or that you are a failure or have let yourself or your family down ***  Trouble concentrating on things, such as reading the newspaper or watching television ***  Moving or speaking so slowly that other people could have noticed? Or the opposite --- being so fidgety or restless that you have been moving around a lot more than usual ***  Thoughts that you would be better off dead or hurting yourself in some way ***  PHQ-9 Score ***    The Generalized Anxiety Disorder-7 (GAD-7) is a brief self-report measure that assesses symptoms of anxiety over the course of the last two weeks. Chosen obtained a score of *** suggesting {gbgad7severity:21753}. Jackelyne finds the endorsed symptoms to be {gbphq9difficulty:21754}. Feeling nervous, anxious, on edge ***  Not being able to stop or control worrying ***  Worrying too much about different things ***  Trouble relaxing ***  Being so restless that it's hard to sit still ***  Becoming easily annoyed or irritable ***  Feeling afraid as if something awful might happen ***  GAD-7 Score ***   Interventions: A chart review was conducted prior to the clinical intake interview. The PHQ-9, and GAD-7 were verbally *** administered as well as a Mood and Food questionnaire to assess various behaviors related to emotional eating. Throughout session, empathic  reflections and validation was provided. Continuing treatment with this provider was discussed and a treatment goal was established. Psychoeducation regarding emotional versus physical hunger was provided. Vernella was sent a handout via a MyChart message*** to  utilize between now and the next appointment to increase awareness of hunger patterns and subsequent eating. Prior to sending the [place] message, this provider explained the message would be visible to all providers, as it would be part of the electronic medical record. Mc verbally acknowledged understanding, and verbally consented to this provider sending the MyChart message***.  Provisional DSM-5 Diagnosis: ***  Plan: Kenlyn appears able and willing to participate as evidenced by collaboration on a treatment goal, engagement in reciprocal conversation, and asking questions as needed for clarification. The next appointment will be scheduled in {gbweeks:21758}, which will be via [employer]. The following treatment goal was established: {gbtxgoals:21759}. Once this provider's office resumes in-person appointments and it is deemed appropriate, Manar will be notified. For the aforementioned goal, Amorie can benefit from biweekly individual therapy sessions that are brief in duration for approximately four to six sessions. The treatment modality will be individual therapeutic services, including an eclectic therapeutic approach utilizing techniques from Cognitive Behavioral Therapy, Patient Centered Therapy, Dialectical Behavior Therapy, Acceptance and Commitment Therapy, Interpersonal Therapy, and Cognitive Restructuring. Therapeutic approach will include various interventions as appropriate, such as validation, support, mindfulness, thought defusion, reframing, psychoeducation, values assessment, and role playing. This provider will regularly review the treatment plan and medical chart to keep informed of status changes. Yanissa expressed  understanding and agreement with the initial treatment plan of care.

## 2019-05-17 ENCOUNTER — Ambulatory Visit (INDEPENDENT_AMBULATORY_CARE_PROVIDER_SITE_OTHER): Payer: Managed Care, Other (non HMO) | Admitting: Bariatrics

## 2019-05-17 NOTE — Telephone Encounter (Signed)
Please advise pt

## 2019-05-19 ENCOUNTER — Ambulatory Visit (INDEPENDENT_AMBULATORY_CARE_PROVIDER_SITE_OTHER): Payer: Managed Care, Other (non HMO) | Admitting: Bariatrics

## 2019-05-19 ENCOUNTER — Other Ambulatory Visit: Payer: Self-pay

## 2019-05-19 ENCOUNTER — Encounter (INDEPENDENT_AMBULATORY_CARE_PROVIDER_SITE_OTHER): Payer: Self-pay | Admitting: Bariatrics

## 2019-05-19 VITALS — BP 102/69 | HR 85 | Temp 99.3°F | Ht 62.0 in | Wt 169.0 lb

## 2019-05-19 DIAGNOSIS — I10 Essential (primary) hypertension: Secondary | ICD-10-CM

## 2019-05-19 DIAGNOSIS — Z6831 Body mass index (BMI) 31.0-31.9, adult: Secondary | ICD-10-CM

## 2019-05-19 DIAGNOSIS — F3289 Other specified depressive episodes: Secondary | ICD-10-CM

## 2019-05-19 DIAGNOSIS — E669 Obesity, unspecified: Secondary | ICD-10-CM | POA: Diagnosis not present

## 2019-05-20 NOTE — Progress Notes (Signed)
Office: [phone]  /  Fax: [phone]   HPI:   Chief Complaint: OBESITY Susan [name] is here to discuss her progress with her obesity treatment plan. She is on the Category 2 plan and is following her eating plan approximately 80 % of the time. She states she is exercising 0 minutes 0 times per week. Susan [name] is doing well overall, but she cannot do green vegetables. Susan [name] liked the plan and she has done well overall. She has been getting more protein. Susan [name] occasionally stress eats. Her weight is 169 lb (76.7 kg) today and has had a weight loss of 3 pounds over a period of 2 weeks since her last visit. She has lost 3 lbs since starting treatment with Susan [name].  Hypertension Susan [name] is a 48 y.o. female with hypertension. She is taking Zestoretic. Susan [name] denies lightheadedness. She is working weight loss to help control her blood pressure with the goal of decreasing her risk of heart attack and stroke. Felicias blood pressure is well controlled.  Depression with emotional eating behaviors Susan [name] struggles with emotional eating and using food for comfort to the extent that it is negatively impacting her health. She often snacks when she is not hungry. Susan [name] sometimes feels she is out of control and then feels guilty that she made poor food choices. Susan [name] had a phone visit with Dr. Dewaine CongerBarker (bariatric psychologist) on 05/13/19. She has been working on behavior modification techniques to help reduce her emotional eating and has been somewhat successful. She shows no sign of suicidal or homicidal ideations.  ASSESSMENT AND PLAN:  Essential hypertension  Other depression  Class 1 obesity with serious comorbidity and body mass index (BMI) of 31.0 to 31.9 in adult, unspecified obesity type  PLAN:  Hypertension We discussed sodium restriction, working on healthy weight loss, and a regular exercise program as the means to achieve improved blood pressure control. Susan [name] agreed  with this plan and agreed to follow up as directed. We will continue to monitor her blood pressure as well as her progress with the above lifestyle modifications. She will continue her medications as prescribed and will watch for signs of hypotension as she continues her lifestyle modifications.  Depression with Emotional Eating Behaviors We discussed behavior modification techniques today to help Susan [name] deal with her emotional eating and depression.   I spent > than 50% of the 15 minute visit on counseling as documented in the note.  Obesity Susan [name] is currently in the action stage of change. As such, her goal is to continue with weight loss efforts She has agreed to follow the Category 2 plan Susan [name] will increase her activity level for weight loss and overall health benefits. We discussed the following Behavioral Modification Strategies today: increase H2O intake, no skipping meals, keeping healthy foods in the home, increasing lean protein intake, decreasing simple carbohydrates, increasing vegetables, decrease eating out and work on meal planning and easy cooking plans Labs were reviewed with patient today.  Susan [name] has agreed to follow up with our clinic in 2 weeks. She was informed of the importance of frequent follow up visits to maximize her success with intensive lifestyle modifications for her multiple health conditions.  ALLERGIES: No Known Allergies  MEDICATIONS: Current Outpatient Medications on File Prior to Visit  Medication Sig Dispense Refill  . levonorgestrel (MIRENA, 52 MG,) 20 MCG/24HR IUD 1 each by Intrauterine route once. Inserted 2017     . lisinopril-hydrochlorothiazide (PRINZIDE,ZESTORETIC) 10-12.5 MG tablet TAKE 1 TABLET BY MOUTH DAILY 30 tablet 4  .  Multiple Vitamin (MULTIVITAMIN) capsule Take by mouth.     No current facility-administered medications on file prior to visit.     PAST MEDICAL HISTORY: Past Medical History:  Diagnosis Date  . Anemia 2016    with bleeding ulcer  . Arthritis    oa hands  . Constipation   . Dizziness   . GERD (gastroesophageal reflux disease)    no current meds  . GI bleeding   . Glaucoma   . Headache    headaches and migraines  . History of bleeding ulcers   . History of stomach ulcers   . Hyperlipidemia    no cholesterol meds taken  . Hypertension   . Hypothyroidism   . Insomnia   . Iron deficiency   . Irregular periods   . Osteoarthritis of both hands   . PCOS (polycystic ovarian syndrome)   . Pre-diabetes   . Syncope   . Vitamin D deficiency     PAST SURGICAL HISTORY: Past Surgical History:  Procedure Laterality Date  . BILATERAL SALPINGECTOMY Bilateral 03/22/2013   Procedure: BILATERAL SALPINGECTOMY;  Surgeon: Marvene Staff, MD;  Location: [facility];  Service: Gynecology;  Laterality: Bilateral;  . CARPAL TUNNEL RELEASE Right 11/15/2013   Procedure: RIGHT CARPAL TUNNEL RELEASE;  Surgeon: Wynonia Sours, MD;  Location: [facility];  Service: Orthopedics;  Laterality: Right;  . COSMETIC SURGERY  01/2015   tummy tuck   . ESOPHAGOGASTRODUODENOSCOPY N/A 10/04/2015   Procedure: ESOPHAGOGASTRODUODENOSCOPY (EGD);  Surgeon: Manus Gunning, MD;  Location: [facility];  Service: Gastroenterology;  Laterality: N/A;  . GANGLION CYST EXCISION Right 11/15/2013   Procedure: EXCISION CYST RIGHT WRIST;  Surgeon: Wynonia Sours, MD;  Location: [facility];  Service: Orthopedics;  Laterality: Right;  . LAPAROSCOPIC GASTRIC BAND REMOVAL WITH LAPAROSCOPIC GASTRIC SLEEVE RESECTION N/A 09/23/2017   Procedure: LAPAROSCOPIC GASTRIC BAND REMOVAL WITH LAPAROSCOPIC GASTRIC SLEEVE RESECTION;  Surgeon: Johnathan Hausen, MD;  Location: [facility];  Service: General;  Laterality: N/A;  . LAPAROSCOPIC GASTRIC BANDING  2010  . LAPAROSCOPY N/A 03/22/2013   Procedure: LAPAROSCOPY OPERATIVE;  Surgeon: Marvene Staff, MD;  Location: [facility];  Service: Gynecology;  Laterality: N/A;  . MOUTH  SURGERY  1991  . SALPINGECTOMY  03/22/2013    SOCIAL HISTORY: Social History   Tobacco Use  . Smoking status: Never Smoker  . Smokeless tobacco: Never Used  Substance Use Topics  . Alcohol use: Yes    Alcohol/week: 1.0 standard drinks    Types: 1 Standard drinks or equivalent per week    Comment: socially  . Drug use: No    FAMILY HISTORY: Family History  Problem Relation Age of Onset  . Cancer Mother        pancreatic  . Stroke Mother   . Diabetes Mother   . Diabetes gravidarum Mother   . High blood pressure Mother   . Heart disease Mother   . Obesity Mother   . Hypertension Father   . Ulcers Father   . Diabetes Father   . High blood pressure Father   . Heart disease Father   . Thyroid disease Father   . Hypertension Brother     ROS: Review of Systems  Constitutional: Positive for weight loss.  Neurological:       Negative for lightheadedness  Psychiatric/Behavioral: Positive for depression. Negative for suicidal ideas.    PHYSICAL EXAM: Blood pressure 102/69, pulse 85, temperature 99.3 F (37.4 C), temperature source Oral, height 5'  2" (1.575 m), weight 169 lb (76.7 kg), SpO2 98 %. Body mass index is 30.91 kg/m. Physical Exam Vitals signs reviewed.  Constitutional:      Appearance: Normal appearance. She is well-developed. She is obese.  Cardiovascular:     Rate and Rhythm: Normal rate.  Pulmonary:     Effort: Pulmonary effort is normal.  Musculoskeletal: Normal range of motion.  Skin:    General: Skin is warm and dry.  Neurological:     Mental Status: She is alert and oriented to person, place, and time.  Psychiatric:        Mood and Affect: Mood normal.        Behavior: Behavior normal.        Thought Content: Thought content does not include homicidal or suicidal ideation.     RECENT LABS AND TESTS: BMET    Component Value Date/Time   NA 139 03/18/2019 1118   K 4.1 03/18/2019 1118   CL 97 03/18/2019 1118   CO2 25 03/18/2019 1118    GLUCOSE 85 03/18/2019 1118   GLUCOSE 99 09/16/2017 1348   BUN 9 03/18/2019 1118   CREATININE 1.01 (H) 03/18/2019 1118   CALCIUM 9.8 03/18/2019 1118   GFRNONAA 67 03/18/2019 1118   GFRAA 77 03/18/2019 1118   Lab Results  Component Value Date   HGBA1C 5.6 03/18/2019   HGBA1C 5.5 09/10/2018   HGBA1C 5.8 (H) 09/16/2017   Lab Results  Component Value Date   INSULIN 7.7 03/18/2019   CBC    Component Value Date/Time   WBC 5.3 03/18/2019 1118   WBC 13.7 (H) 09/24/2017 0538   RBC 4.41 03/18/2019 1118   RBC 3.56 (L) 09/24/2017 0538   HGB 14.3 03/18/2019 1118   HCT 41.7 03/18/2019 1118   PLT 316 03/18/2019 1118   MCV 95 03/18/2019 1118   MCH 32.4 03/18/2019 1118   MCH 30.1 09/24/2017 0538   MCHC 34.3 03/18/2019 1118   MCHC 32.9 09/24/2017 0538   RDW 12.4 03/18/2019 1118   LYMPHSABS 1.5 09/24/2017 0538   MONOABS 0.6 09/24/2017 0538   EOSABS 0.0 09/24/2017 0538   BASOSABS 0.0 09/24/2017 0538   Iron/TIBC/Ferritin/ %Sat    Component Value Date/Time   IRON 114 10/03/2015 1908   TIBC 329 10/03/2015 1908   FERRITIN 15 10/03/2015 1908   IRONPCTSAT 35 (H) 10/03/2015 1908   Lipid Panel     Component Value Date/Time   CHOL 293 (H) 03/18/2019 1118   TRIG 80 03/18/2019 1118   HDL 51 03/18/2019 1118   CHOLHDL 5.7 (H) 03/18/2019 1118   LDLCALC 226 (H) 03/18/2019 1118   Hepatic Function Panel     Component Value Date/Time   PROT 7.8 03/18/2019 1118   ALBUMIN 4.5 03/18/2019 1118   AST 17 03/18/2019 1118   ALT 10 03/18/2019 1118   ALKPHOS 68 03/18/2019 1118   BILITOT 0.5 03/18/2019 1118   BILIDIR <0.1 (L) 09/23/2017 1146   IBILI NOT CALCULATED 09/23/2017 1146      Component Value Date/Time   TSH 1.520 05/04/2019 0000   TSH 1.140 09/10/2018 1641     Ref. Range 03/18/2019 11:18  Vitamin D, 25-Hydroxy Latest Ref Range: 30.0 - 100.0 ng/mL 72.8    OBESITY BEHAVIORAL INTERVENTION VISIT  Today's visit was # 2   Starting weight: 172 lbs Starting date: 05/04/2019 Today's  weight : 169 lbs  Today's date: 05/19/2019 Total lbs lost to date: 3    05/19/2019  Height 5\' 2"  (1.575 m)  Weight 169 lb (76.7 kg)  BMI (Calculated) 30.9  BLOOD PRESSURE - SYSTOLIC 102  BLOOD PRESSURE - DIASTOLIC 69   Body Fat % 37.7 %  Total Body Water (lbs) 69.4 lbs    ASK: We discussed the diagnosis of obesity with Susan OmsFelicia Flammer today and [place] agreed to give Susan [name] permission to discuss obesity behavioral modification therapy today.  ASSESS: Susan [name] has the diagnosis of obesity and her BMI today is 30.9 Susan [name] is in the action stage of change   ADVISE: Susan [name] was educated on the multiple health risks of obesity as well as the benefit of weight loss to improve her health. She was advised of the need for long term treatment and the importance of lifestyle modifications to improve her current health and to decrease her risk of future health problems.  AGREE: Multiple dietary modification options and treatment options were discussed and  Susan [name] agreed to follow the recommendations documented in the above note.  ARRANGE: Susan [name] was educated on the importance of frequent visits to treat obesity as outlined per CMS and USPSTF guidelines and agreed to schedule her next follow up appointment today.  Cristi LoronI, Joanne Murray, am acting as Energy managertranscriptionist for [employer] Corporation[employer]el A. Manson PasseyBrown, DO  I have reviewed the above documentation for accuracy and completeness, and I agree with the above. -Corinna CapraAngel Saidah Kempton, DO

## 2019-05-24 ENCOUNTER — Ambulatory Visit (INDEPENDENT_AMBULATORY_CARE_PROVIDER_SITE_OTHER): Payer: Managed Care, Other (non HMO) | Admitting: Psychology

## 2019-05-24 ENCOUNTER — Encounter (INDEPENDENT_AMBULATORY_CARE_PROVIDER_SITE_OTHER): Payer: Self-pay | Admitting: Bariatrics

## 2019-05-24 ENCOUNTER — Other Ambulatory Visit: Payer: Self-pay

## 2019-05-24 DIAGNOSIS — F3289 Other specified depressive episodes: Secondary | ICD-10-CM | POA: Diagnosis not present

## 2019-05-24 NOTE — Progress Notes (Signed)
Office: [phone]  /  Fax: [phone]    Date: May 24, 2019    Appointment Start Time: 12:17pm Duration: 28 minutes Provider: Glennie Isle, Psy.D. Type of Session: Intake for Individual Therapy  Location of Patient: Work Location of Provider: Provider's Home Type of Contact: Telepsychological Visit via [employer]  Informed Consent: This provider called Susan [name] at 49:67RF as she did not present for today's Webex appointment. A HIPAA compliant voicemail was left requesting a call back. Susan [name] called the provider's office at 12:09pm; therefore, this provider called her back. The e-mail with the secure link was re-sent and directions were provided to assist with connecting. Technical issues persisted; therefore, Webex was utilized for video capabilities and a regular phone call was used for audio. As such, today's appointment was initiated 17 minutes late. Prior to proceeding with today's appointment, two pieces of identifying information were obtained from [name] to verify identity. In addition, Susan [name]'s physical location at the time of this appointment was obtained. Susan [name] reported she was at work and provided the address. In the event of technical difficulties, Armetta shared a phone number she could be reached at. Susan [name] and this provider participated in today's telepsychological service. Also, Susan [name] denied anyone else being present in the room or on the WebEx appointment.   The provider's role was explained to Susan [name]. The provider reviewed and discussed issues of confidentiality, privacy, and limits therein (e.g., reporting obligations). In addition to verbal informed consent, written informed consent for psychological services was obtained from Susan [name] prior to the initial intake interview. Written consent included information concerning the practice, financial arrangements, and confidentiality and patients' rights. Since the clinic is not a 24/7 crisis center, mental  health emergency resources were shared, and the provider explained MyChart, e-mail, voicemail, and/or other messaging systems should be utilized only for non-emergency reasons. This provider also explained that information obtained during appointments will be placed in Susan [name]'s medical record in a confidential manner and relevant information will be shared with other providers at [facility] that she meets with for coordination of care. Susan [name] verbally acknowledged understanding of the aforementioned, and agreed to use mental health emergency resources discussed if needed. Moreover, Susan [name] agreed information may be shared with other [facility] providers as needed for coordination of care. By signing the service agreement document, Susan [name] provided written consent for coordination of care.   Prior to initiating telepsychological services, Susan [name] was provided with an informed consent document, which included the development of a safety plan (i.e., an emergency contact and emergency resources) in the event of an emergency/crisis. Susan [name] expressed understanding of the rationale of the safety plan and provided consent for this provider to reach out to her emergency contact in the event of an emergency/crisis. Susan [name] returned the completed consent form prior to today's appointment. This provider verbally reviewed the consent form during today's appointment prior to proceeding with the appointment. Susan [name] verbally acknowledged understanding that she is ultimately responsible for understanding her insurance benefits as it relates to reimbursement of telepsychological and in-person services. This provider also reviewed confidentiality, as it relates to telepsychological services, as well as the rationale for telepsychological services. More specifically, this provider's clinic is limiting in-person visits due to COVID-19. Therapeutic services will resume to in-person appointments once  deemed appropriate. [name] expressed understanding regarding the rationale for telepsychological services. In addition, this provider explained the telepsychological services informed consent document would be considered an addendum to the initial consent document/service agreement. Susan [name] verbally consented to  proceed.   Chief Complaint/HPI: Susan [name] was referred by Dr. Jearld Lesch due to depression with emotional eating behaviors. Per the note for the visit with Dr. Jearld Lesch on May 03, 3418, "Susan [name] is struggling with emotional eating and using food for comfort to the extent that it is negatively impacting her health. She has nighttime eating behaviors. She often snacks when she is not hungry. [name] sometimes feels she is out of control and then feels guilty that she made poor food choices. She is attempting to work on behavior modification techniques to help reduce her emotional eating. She shows no sign of suicidal or homicidal ideations. PHQ Score is 10." During the initial appointment with Dr. Jearld Lesch at [name] on May 03, 6221, Susan [name] reported experiencing the following: frequently drinking liquids with calories, frequently making poor food choices, binge eating behaviors, struggling with emotional eating, waking up frquently in the middle of the night to eat, snacking after dinner and skipping breakfast . The note further indicated "Belle previously had lap band in October 2018. She has no restriction."  During today's appointment, Susan [name] reported, "I used to be extremely small and I started gaining weight." Subsequently, she would eat as she felt like a "failure." She believes the onset of emotional eating was likely after a revision surgery in October of 2018. Susan [name] was verbally administered a questionnaire assessing various behaviors related to emotional eating. Susan [name] endorsed the following: overeat when you are celebrating, experience food cravings on a regular  basis, eat certain foods when you are anxious, stressed, depressed, or your feelings are hurt, find food is comforting to you, overeat frequently when you are bored or lonely and eat as a reward. She noted the frequency of emotional eating as "daily." Susan [name] stated it is typically at night due to her work schedule. She shared she craves the following: salty, sweet, and crunchy foods. Susan [name] reported eating small amounts of food frequently due to her gastric surgery, and denied binge eating prior to the surgery. Susan [name] reported consuming a weight loss tea three times a day and noted it was a laxative. She noted it was 16 years ago. She denied a history of restricting food intake and purging, and has never been diagnosed with an eating disorder. She also denied a history of treatment for emotional eating. Moreover, Susan [name] indicated frustration related to family and work triggers emotional eating, whereas engaging in activities makes emotional eating better. Furthermore, Susan [name] denied other problems of concern.    Mental Status Examination:  Appearance: neat Behavior: cooperative Mood: euthymic Affect: mood congruent Speech: normal in rate, volume, and tone Eye Contact: appropriate Psychomotor Activity: appropriate Thought Process: linear, logical, and goal directed  Content/Perceptual Disturbances: denies suicidal and homicidal ideation, plan, and intent and no hallucinations, delusions, bizarre thinking or behavior reported or observed Orientation: time, person, place and purpose of appointment Cognition/Sensorium: memory, attention, language, and fund of knowledge intact  Insight: good Judgment: good  Family & Psychosocial History: Kristyana reported she has been married for 15 years and she has two children (ages 54 and 61). She indicated she is currently employed as a Pharmacist, community at [employer]. Additionally, Jetaun shared her highest level of education obtained is a doctorate of dental surgery  degree. Currently, Margarine's social support system consists of her husband, friends, kids, and sorority sisters. Moreover, Joselynn stated she resides with her husband, father, and children.   Medical History:  Past Medical History:  Diagnosis Date   Anemia 2016  with bleeding ulcer   Arthritis    oa hands   Constipation    Dizziness    GERD (gastroesophageal reflux disease)    no current meds   GI bleeding    Glaucoma    Headache    headaches and migraines   History of bleeding ulcers    History of stomach ulcers    Hyperlipidemia    no cholesterol meds taken   Hypertension    Hypothyroidism    Insomnia    Iron deficiency    Irregular periods    Osteoarthritis of both hands    PCOS (polycystic ovarian syndrome)    Pre-diabetes    Syncope    Vitamin D deficiency    Past Surgical History:  Procedure Laterality Date   BILATERAL SALPINGECTOMY Bilateral 03/22/2013   Procedure: BILATERAL SALPINGECTOMY;  Surgeon: Marvene Staff, MD;  Location: [facility];  Service: Gynecology;  Laterality: Bilateral;   CARPAL TUNNEL RELEASE Right 11/15/2013   Procedure: RIGHT CARPAL TUNNEL RELEASE;  Surgeon: Wynonia Sours, MD;  Location: [facility];  Service: Orthopedics;  Laterality: Right;   COSMETIC SURGERY  01/2015   tummy tuck    ESOPHAGOGASTRODUODENOSCOPY N/A 10/04/2015   Procedure: ESOPHAGOGASTRODUODENOSCOPY (EGD);  Surgeon: Manus Gunning, MD;  Location: [facility];  Service: Gastroenterology;  Laterality: N/A;   GANGLION CYST EXCISION Right 11/15/2013   Procedure: EXCISION CYST RIGHT WRIST;  Surgeon: Wynonia Sours, MD;  Location: [facility];  Service: Orthopedics;  Laterality: Right;   LAPAROSCOPIC GASTRIC BAND REMOVAL WITH LAPAROSCOPIC GASTRIC SLEEVE RESECTION N/A 09/23/2017   Procedure: LAPAROSCOPIC GASTRIC BAND REMOVAL WITH LAPAROSCOPIC GASTRIC SLEEVE RESECTION;  Surgeon: Johnathan Hausen, MD;  Location: [facility];   Service: General;  Laterality: N/A;   LAPAROSCOPIC GASTRIC BANDING  2010   LAPAROSCOPY N/A 03/22/2013   Procedure: LAPAROSCOPY OPERATIVE;  Surgeon: Marvene Staff, MD;  Location: [facility];  Service: Gynecology;  Laterality: N/A;   MOUTH SURGERY  1991   SALPINGECTOMY  03/22/2013   Current Outpatient Medications on File Prior to Visit  Medication Sig Dispense Refill   levonorgestrel (MIRENA, 52 MG,) 20 MCG/24HR IUD 1 each by Intrauterine route once. Inserted 2017      lisinopril-hydrochlorothiazide (PRINZIDE,ZESTORETIC) 10-12.5 MG tablet TAKE 1 TABLET BY MOUTH DAILY 30 tablet 4   Multiple Vitamin (MULTIVITAMIN) capsule Take by mouth.     No current facility-administered medications on file prior to visit.   Nykeria denied a history of head injuries and loss of consciousness.   Mental Health History: Byrd denied a history of mental health treatment, including therapeutic services. Oyindamola denied a history of hospitalizations for psychiatric concerns, and has never met with a psychiatrist. Regarding psychotropic medications, Summerlynn stated she was prescribed Zoloft in 1993 and she does not believe she took it long. She explained her maternal cousin died by suicide. Jenyfer denied a trauma history, including psychological, physical  and sexual abuse, as well as neglect.   Adana described her typical mood as "really high or really low." She explained feeling out of control. Aside from concerns noted above and endorsed on the PHQ-9 and GAD-7, Joyice reported experiencing worry and racing thoughts about current events, as well as sleep difficulties for which she takes a sleeping aide. She also reported she feels "full of energy and many ideas." This was explored and it was identified she feels that when she has extended time off from work due to COVID-19 and wants to complete tasks on her to-do  list. She denied experiencing any other symptoms of mania. Jorden endorsed current alcohol use.  She indicated she "cut it out" of her diet since starting with the clinic. Prior to starting with the clinic, Kennia stating she was consuming alcohol daily in the form of a standard drink. She denied tobacco use. She denied illicit substance use. Regarding caffeine intake, Naly reported consuming Lipton tea "every once in a while." Furthermore, Donne denied experiencing the following: hopelessness, obsessions and compulsions, hallucinations and delusions, paranoia and decreased motivation. She also denied history of and current suicidal ideation, plan, and intent; history of and current homicidal ideation, plan, and intent; and history of and current engagement in self-harm.  The following strengths were reported by [facility]: extremely motivated, full of ideas, full of passion, love people, organization, and helping others. The following strengths were observed by this provider: ability to express thoughts and feelings during the therapeutic session, ability to establish and benefit from a therapeutic relationship, ability to learn and practice coping skills, willingness to work toward established goal(s) with the clinic and ability to engage in reciprocal conversation.  Legal History: Jonice denied a history of legal involvement.   Structured Assessment Results: The Patient Health Questionnaire-9 (PHQ-9) is a self-report measure that assesses symptoms and severity of depression over the course of the last two weeks. Cylah obtained a score of 8 suggesting mild depression. Melyna finds the endorsed symptoms to be somewhat difficult. Little interest or pleasure in doing things 0  Feeling down, depressed, or hopeless 0  Trouble falling or staying asleep, or sleeping too much 1  Feeling tired or having little energy 2  Poor appetite or overeating 1  Feeling bad about yourself --- or that you are a failure or have let yourself or your family down 0  Trouble concentrating on things, such as reading  the newspaper or watching television 1  Moving or speaking so slowly that other people could have noticed? Or the opposite --- being so fidgety or restless that you have been moving around a lot more than usual 1  Thoughts that you would be better off dead or hurting yourself in some way 0  PHQ-9 Score 8    The Generalized Anxiety Disorder-7 (GAD-7) is a brief self-report measure that assesses symptoms of anxiety over the course of the last two weeks. Sanayah obtained a score of 6 suggesting mild anxiety. Crucita finds the endorsed symptoms to be very difficult. Feeling nervous, anxious, on edge 1  Not being able to stop or control worrying 1  Worrying too much about different things 0  Trouble relaxing 1  Being so restless that it's hard to sit still 1  Becoming easily annoyed or irritable 1  Feeling afraid as if something awful might happen 1  GAD-7 Score 6   Interventions: A chart review was conducted prior to the clinical intake interview. The PHQ-9, and GAD-7 were verbally administered as well as a Mood and Food questionnaire to assess various behaviors related to emotional eating. Throughout session, empathic reflections and validation was provided. Continuing treatment with this provider was discussed and a treatment goal was established. Psychoeducation regarding emotional versus physical hunger was provided. Ermalee was sent a handout via e-mail to utilize between now and the next appointment to increase awareness of hunger patterns and subsequent eating. Akyra provided verbal consent during today's appointment for this provider to send the handout via e-mail.   Provisional DSM-5 Diagnosis: 311 (F32.8) Other Specified Depressive Disorder, Emotional Eating Behaviors  Plan: Kierstyn appears able and willing to participate as evidenced by collaboration on a treatment goal, engagement in reciprocal conversation, and asking questions as needed for clarification. The next appointment will be  scheduled in two weeks, which will be via [employer]. The following treatment goal was established: decrease emotional eating. Once this provider's office resumes in-person appointments and it is deemed appropriate, Malaiyah will be notified. For the aforementioned goal, Luka can benefit from biweekly individual therapy sessions that are brief in duration for approximately four to six sessions. The treatment modality will be individual therapeutic services, including an eclectic therapeutic approach utilizing techniques from Cognitive Behavioral Therapy, Patient Centered Therapy, Dialectical Behavior Therapy, Acceptance and Commitment Therapy, Interpersonal Therapy, and Cognitive Restructuring. Therapeutic approach will include various interventions as appropriate, such as validation, support, mindfulness, thought defusion, reframing, psychoeducation, values assessment, and role playing. This provider will regularly review the treatment plan and medical chart to keep informed of status changes. Ashaya expressed understanding and agreement with the initial treatment plan of care.

## 2019-06-07 ENCOUNTER — Encounter (HOSPITAL_COMMUNITY): Payer: Self-pay

## 2019-06-07 ENCOUNTER — Ambulatory Visit (INDEPENDENT_AMBULATORY_CARE_PROVIDER_SITE_OTHER): Payer: Managed Care, Other (non HMO) | Admitting: Family Medicine

## 2019-06-07 ENCOUNTER — Encounter (INDEPENDENT_AMBULATORY_CARE_PROVIDER_SITE_OTHER): Payer: Self-pay | Admitting: Family Medicine

## 2019-06-07 ENCOUNTER — Other Ambulatory Visit: Payer: Self-pay

## 2019-06-07 VITALS — BP 114/74 | HR 82 | Temp 98.2°F | Ht 62.0 in | Wt 169.0 lb

## 2019-06-07 DIAGNOSIS — E7800 Pure hypercholesterolemia, unspecified: Secondary | ICD-10-CM | POA: Diagnosis not present

## 2019-06-07 DIAGNOSIS — E669 Obesity, unspecified: Secondary | ICD-10-CM

## 2019-06-07 DIAGNOSIS — Z6831 Body mass index (BMI) 31.0-31.9, adult: Secondary | ICD-10-CM

## 2019-06-07 DIAGNOSIS — I10 Essential (primary) hypertension: Secondary | ICD-10-CM | POA: Diagnosis not present

## 2019-06-07 NOTE — Progress Notes (Signed)
Office: [phone]  /  Fax: [phone]   HPI:   Chief Complaint: OBESITY Susan [name] is here to discuss her progress with her obesity treatment plan. She is on the Category 2 plan and is following her eating plan approximately 100 % of the time. She states she is exercising 0 minutes 0 times per week. Susan [name] denies hunger. She is feeling frustrated with lack of weight loss. She is tracking on the meal plan, averaging around 62 grams of protein a day and often not exceeding 800 calories.  Her weight is 169 lb (76.7 kg) today and has not lost weight since her last visit. She has lost 3 lbs since starting treatment with us.  Hypertension Susan [name] is a 48 y.o. female with hypertension. Susan [name]'s blood pressure is well controlled today. She denies chest pain, chest pressure, or headaches. She is working on weight loss to help control her blood pressure with the goal of decreasing her risk of heart attack and stroke.   Hyperlipidemia Susan [name] has hyperlipidemia and has been trying to improve her cholesterol levels with intensive lifestyle modification including a low saturated fat diet, exercise and weight loss. Last LDL was >200 on initial labs. She is not on statin and denies any chest pain, claudication or myalgias.  ASSESSMENT AND PLAN:  Class 1 obesity with serious comorbidity and body mass index (BMI) of 31.0 to 31.9 in adult, unspecified obesity type  Essential hypertension  Hypercholesteremia  PLAN:  Hypertension We discussed sodium restriction, working on healthy weight loss, and a regular exercise program as the means to achieve improved blood pressure control. Susan [name] agreed with this plan and agreed to follow up as directed. We will continue to monitor her blood pressure as well as her progress with the above lifestyle modifications. Susan [name] agrees to continue her current medications and will watch for signs of hypotension as she continues her lifestyle modifications. We  will follow up at her next appointment if her blood pressure is controlled in the lows, will cut to 5 mg lisinopril and 12.5 mg hydrochlorothiazide. Susan [name] agrees to follow up with our clinic in 2 weeks.  Hyperlipidemia Susan [name] was informed of the American Heart Association Guidelines emphasizing intensive lifestyle modifications as the first line treatment for hyperlipidemia. We discussed many lifestyle modifications today in depth, and Susan [name] will continue to work on decreasing saturated fats such as fatty red meat, butter and many fried foods. She will also increase vegetables and lean protein in her diet and continue to work on exercise and weight loss efforts. We will repeat labs in 2 months. Susan [name] agrees to follow up with our clinic in 2 weeks.  Obesity Susan [name] is currently in the action stage of change. As such, her goal is to continue with weight loss efforts She has agreed to keep a food journal with 1050-1150 calories and 75+ grams of protein daily Susan [name] has been instructed to work up to a goal of 150 minutes of combined cardio and strengthening exercise per week for weight loss and overall health benefits. We discussed the following Behavioral Modification Strategies today: increasing lean protein intake, increasing vegetables and work on meal planning and easy cooking plans, keeping healthy foods in the home, and planning for success   Susan [name] has agreed to follow up with our clinic in 2 weeks. She was informed of the importance of frequent follow up visits to maximize her success with intensive lifestyle modifications for her multiple health conditions.  ALLERGIES: No Known Allergies  MEDICATIONS: Current Outpatient  Medications on File Prior to Visit  Medication Sig Dispense Refill   levonorgestrel (MIRENA, 52 MG,) 20 MCG/24HR IUD 1 each by Intrauterine route once. Inserted 2017      lisinopril-hydrochlorothiazide (PRINZIDE,ZESTORETIC) 10-12.5 MG tablet TAKE 1 TABLET BY  MOUTH DAILY 30 tablet 4   Multiple Vitamin (MULTIVITAMIN) capsule Take by mouth.     No current facility-administered medications on file prior to visit.     PAST MEDICAL HISTORY: Past Medical History:  Diagnosis Date   Anemia 2016   with bleeding ulcer   Arthritis    oa hands   Constipation    Dizziness    GERD (gastroesophageal reflux disease)    no current meds   GI bleeding    Glaucoma    Headache    headaches and migraines   History of bleeding ulcers    History of stomach ulcers    Hyperlipidemia    no cholesterol meds taken   Hypertension    Hypothyroidism    Insomnia    Iron deficiency    Irregular periods    Osteoarthritis of both hands    PCOS (polycystic ovarian syndrome)    Pre-diabetes    Syncope    Vitamin D deficiency     PAST SURGICAL HISTORY: Past Surgical History:  Procedure Laterality Date   BILATERAL SALPINGECTOMY Bilateral 03/22/2013   Procedure: BILATERAL SALPINGECTOMY;  Surgeon: Serita KyleSheronette A Cousins, MD;  Location: [facility];  Service: Gynecology;  Laterality: Bilateral;   CARPAL TUNNEL RELEASE Right 11/15/2013   Procedure: RIGHT CARPAL TUNNEL RELEASE;  Surgeon: Nicki ReaperGary R Kuzma, MD;  Location: [facility];  Service: Orthopedics;  Laterality: Right;   COSMETIC SURGERY  01/2015   tummy tuck    ESOPHAGOGASTRODUODENOSCOPY N/A 10/04/2015   Procedure: ESOPHAGOGASTRODUODENOSCOPY (EGD);  Surgeon: Ruffin FrederickSteven Paul Armbruster, MD;  [facility];  Service: Gastroenterology;  Laterality: N/A;   GANGLION CYST EXCISION Right 11/15/2013   Procedure: EXCISION CYST RIGHT WRIST;  Surgeon: Nicki ReaperGary R Kuzma, MD;  Location: [facility];  Service: Orthopedics;  Laterality: Right;   LAPAROSCOPIC GASTRIC BAND REMOVAL WITH LAPAROSCOPIC GASTRIC SLEEVE RESECTION N/A 09/23/2017   Procedure: LAPAROSCOPIC GASTRIC BAND REMOVAL WITH LAPAROSCOPIC GASTRIC SLEEVE RESECTION;  Surgeon: Luretha MurphyMartin, Matthew, MD;  Location: [facility];   Service: General;  Laterality: N/A;   LAPAROSCOPIC GASTRIC BANDING  2010   LAPAROSCOPY N/A 03/22/2013   Procedure: LAPAROSCOPY OPERATIVE;  Surgeon: Serita KyleSheronette A Cousins, MD;  Location: [facility];  Service: Gynecology;  Laterality: N/A;   MOUTH SURGERY  1991   SALPINGECTOMY  03/22/2013    SOCIAL HISTORY: Social History   Tobacco Use   Smoking status: Never Smoker   Smokeless tobacco: Never Used  Substance Use Topics   Alcohol use: Yes    Alcohol/week: 1.0 standard drinks    Types: 1 Standard drinks or equivalent per week    Comment: socially   Drug use: No    FAMILY HISTORY: Family History  Problem Relation Age of Onset   Cancer Mother        pancreatic   Stroke Mother    Diabetes Mother    Diabetes gravidarum Mother    High blood pressure Mother    Heart disease Mother    Obesity Mother    Hypertension Father    Ulcers Father    Diabetes Father    High blood pressure Father    Heart disease Father    Thyroid disease Father    Hypertension Brother     ROS:  Review of Systems  Constitutional: Negative for weight loss.  Cardiovascular: Negative for chest pain and claudication.       Negative chest pressure  Musculoskeletal: Negative for myalgias.  Neurological: Negative for headaches.    PHYSICAL EXAM: Blood pressure 114/74, pulse 82, temperature 98.2 F (36.8 C), temperature source Oral, height 5\' 2"  (1.575 m), weight 169 lb (76.7 kg), SpO2 97 %. Body mass index is 30.91 kg/m. Physical Exam Vitals signs reviewed.  Constitutional:      Appearance: Normal appearance. She is obese.  Cardiovascular:     Rate and Rhythm: Normal rate.     Pulses: Normal pulses.  Pulmonary:     Effort: Pulmonary effort is normal.     Breath sounds: Normal breath sounds.  Musculoskeletal: Normal range of motion.  Skin:    General: Skin is warm and dry.  Neurological:     Mental Status: She is alert and oriented to person, place, and time.  Psychiatric:          Mood and Affect: Mood normal.        Behavior: Behavior normal.     RECENT LABS AND TESTS: BMET    Component Value Date/Time   NA 139 03/18/2019 1118   K 4.1 03/18/2019 1118   CL 97 03/18/2019 1118   CO2 25 03/18/2019 1118   GLUCOSE 85 03/18/2019 1118   GLUCOSE 99 09/16/2017 1348   BUN 9 03/18/2019 1118   CREATININE 1.01 (H) 03/18/2019 1118   CALCIUM 9.8 03/18/2019 1118   GFRNONAA 67 03/18/2019 1118   GFRAA 77 03/18/2019 1118   Lab Results  Component Value Date   HGBA1C 5.6 03/18/2019   HGBA1C 5.5 09/10/2018   HGBA1C 5.8 (H) 09/16/2017   Lab Results  Component Value Date   INSULIN 7.7 03/18/2019   CBC    Component Value Date/Time   WBC 5.3 03/18/2019 1118   WBC 13.7 (H) 09/24/2017 0538   RBC 4.41 03/18/2019 1118   RBC 3.56 (L) 09/24/2017 0538   HGB 14.3 03/18/2019 1118   HCT 41.7 03/18/2019 1118   PLT 316 03/18/2019 1118   MCV 95 03/18/2019 1118   MCH 32.4 03/18/2019 1118   MCH 30.1 09/24/2017 0538   MCHC 34.3 03/18/2019 1118   MCHC 32.9 09/24/2017 0538   RDW 12.4 03/18/2019 1118   LYMPHSABS 1.5 09/24/2017 0538   MONOABS 0.6 09/24/2017 0538   EOSABS 0.0 09/24/2017 0538   BASOSABS 0.0 09/24/2017 0538   Iron/TIBC/Ferritin/ %Sat    Component Value Date/Time   IRON 114 10/03/2015 1908   TIBC 329 10/03/2015 1908   FERRITIN 15 10/03/2015 1908   IRONPCTSAT 35 (H) 10/03/2015 1908   Lipid Panel     Component Value Date/Time   CHOL 293 (H) 03/18/2019 1118   TRIG 80 03/18/2019 1118   HDL 51 03/18/2019 1118   CHOLHDL 5.7 (H) 03/18/2019 1118   LDLCALC 226 (H) 03/18/2019 1118   Hepatic Function Panel     Component Value Date/Time   PROT 7.8 03/18/2019 1118   ALBUMIN 4.5 03/18/2019 1118   AST 17 03/18/2019 1118   ALT 10 03/18/2019 1118   ALKPHOS 68 03/18/2019 1118   BILITOT 0.5 03/18/2019 1118   BILIDIR <0.1 (L) 09/23/2017 1146   IBILI NOT CALCULATED 09/23/2017 1146      Component Value Date/Time   TSH 1.520 05/04/2019 0000   TSH 1.140  09/10/2018 1641      OBESITY BEHAVIORAL INTERVENTION VISIT  Today's visit was # 3  Starting weight: 172 lbs Starting date: 05/04/2019 Today's weight : 169 lbs  Today's date: 06/07/2019 Total lbs lost to date: 3    ASK: We discussed the diagnosis of obesity with Mike Gip today and Briannon agreed to give [place] permission to discuss obesity behavioral modification therapy today.  ASSESS: Charlese has the diagnosis of obesity and her BMI today is 00.3 Lilit is in the action stage of change   ADVISE: Joury was educated on the multiple health risks of obesity as well as the benefit of weight loss to improve her health. She was advised of the need for long term treatment and the importance of lifestyle modifications to improve her current health and to decrease her risk of future health problems.  AGREE: Multiple dietary modification options and treatment options were discussed and  Patrisha agreed to follow the recommendations documented in the above note.  ARRANGE: Ziasia was educated on the importance of frequent visits to treat obesity as outlined per CMS and USPSTF guidelines and agreed to schedule her next follow up appointment today.  I, Trixie Dredge, am acting as transcriptionist for Ilene Qua, MD  I have reviewed the above documentation for accuracy and completeness, and I agree with the above. - Ilene Qua, MD

## 2019-06-09 ENCOUNTER — Other Ambulatory Visit: Payer: Self-pay

## 2019-06-09 ENCOUNTER — Ambulatory Visit (INDEPENDENT_AMBULATORY_CARE_PROVIDER_SITE_OTHER): Payer: Managed Care, Other (non HMO) | Admitting: Psychology

## 2019-06-09 ENCOUNTER — Telehealth (INDEPENDENT_AMBULATORY_CARE_PROVIDER_SITE_OTHER): Payer: Self-pay | Admitting: Psychology

## 2019-06-09 DIAGNOSIS — F3289 Other specified depressive episodes: Secondary | ICD-10-CM

## 2019-06-09 NOTE — Progress Notes (Signed)
Office: [phone]  /  Fax: [phone]    Date: June 09, 2019    Appointment Start Time: 2:02pm Duration: 22 minutes Provider: Glennie Isle, Psy.D. Type of Session: Individual Therapy  Location of Patient: Work Biomedical scientist of Provider: Provider's Home Type of Contact: Telepsychological Visit via [employer]   Session Content: Susan [name] is a 48 y.o. female presenting via [place] for a follow-up appointment to address the previously established treatment goal of decreasing emotional eating. Today's appointment was a telepsychological visit, as this provider's clinic is seeing a limited number of patients for in-person visits due to COVID-19. Therapeutic services will resume to in-person appointments once deemed appropriate. Susan [name] expressed understanding regarding the rationale for telepsychological services, and provided verbal consent for today's appointment. Prior to proceeding with today's appointment, Susan [name]'s physical location at the time of this appointment was obtained. Susan [name] reported Susan was at work and provided the address. In the event of technical difficulties, Susan [name] shared a phone number Susan could be reached at. Susan [name] and this provider participated in today's telepsychological service. Also, Susan [name] denied anyone else being present in the room or on the WebEx appointment.  This provider conducted a brief check-in and verbally administered the PHQ-9 and GAD-7. Susan [name] reported feeling "annoyed" as Susan did not lose weight despite following recommendations. Associated thoughts and feelings were processed. Susan [name], but Susan [name] stated Susan does not believe that is a realistic approach due to her gastric sleeve. Susan [name] stated Susan has a follow-up appointment scheduled with Susan [name] to adjust a medication and Susan added, "I haven't followed what Susan told me." Her eating habits recently were explored and Susan also disclosed increased  agitation due to current stressors. Thus, psychoeducation regarding self-care utilizing the oxygen metaphor was provided. Moreover, psychoeducation regarding pleasurable activities, including its impact on emotional eating and overall well-being was provided. Susan [name] was provided with a handout with various options of pleasurable activities, and was encouraged to engage in one activity a day. Susan [name] agreed. Susan [name] provided verbal consent during today's appointment for this provider to send the handout via e-mail. Susan [name] was receptive to today's session as evidenced by openness to sharing, responsiveness to feedback, and willingness to engage in pleaurable activities. Session concluded with her noting, "I appreciate you."  Mental Status Examination:  Appearance: neat Behavior: cooperative Mood: euthymic Affect: mood congruent Speech: normal in rate, volume, and tone Eye Contact: appropriate Psychomotor Activity: appropriate Thought Process: linear, logical, and goal directed  Content/Perceptual Disturbances: denies suicidal and homicidal ideation, plan, and intent and no hallucinations, delusions, bizarre thinking or behavior reported or observed Orientation: time, person, place and purpose of appointment Cognition/Sensorium: memory, attention, language, and fund of knowledge intact  Insight: good Judgment: good  Structured Assessment Results: The Patient Health Questionnaire-9 (PHQ-9) is a self-report measure that assesses symptoms and severity of depression over the course of the last two weeks. Susan [name] obtained a score of 7 suggesting mild depression. Susan [name] finds the endorsed symptoms to be somewhat difficult. Susan [name] 0  Feeling down, depressed, or hopeless 1  Trouble falling or staying asleep, or sleeping too much 2  Feeling tired or having Susan energy 1  Poor appetite or overeating 1  Feeling bad about yourself --- or that you are a failure or  have let yourself or your family down 0  Trouble concentrating on [name], such as reading the newspaper or watching television 1  Moving or speaking so slowly  that other people could have noticed? Or the opposite --- being so fidgety or restless that you have been moving around a lot more than usual 1  Thoughts that you would be better off dead or hurting yourself in some way 0  PHQ-9 Score 7    The Generalized Anxiety Disorder-7 (GAD-7) is a brief self-report measure that assesses symptoms of anxiety over the course of the last two weeks. Susan [name] obtained a score of 9 suggesting mild anxiety. Susan [name] finds the endorsed symptoms to be somewhat difficult. Feeling nervous, anxious, on edge 1  Not being able to stop or control worrying 1  Worrying too much about different [name] 1  Trouble relaxing 1  Being so restless that it's hard to sit still 1  Becoming easily annoyed or irritable 3  Feeling afraid as if something awful might happen 1  GAD-7 Score 9   Interventions:  Conducted a brief chart review Verbal administration of PHQ-9 and GAD-7 for symptom monitoring Provided empathic reflections and validation Processed thoughts and feelings Psychoeducation provided regarding pleasurable activities Employed supportive psychotherapy interventions to facilitate reduced distress, and to improve coping skills with identified stressors Psychoeducation provided regarding self-care  DSM-5 Diagnosis: 311 (F32.8) Other Specified Depressive Disorder, Emotional Eating Behaviors  Treatment Goal & Progress: During the initial appointment with this provider, the following treatment goal was established: decrease emotional eating. Progress is limited, as Susan [name] has just begun treatment with this provider; however, Susan is receptive to the interaction and interventions and rapport is being established.   Plan: Susan [name] continues to appear able and willing to participate as evidenced by engagement in  reciprocal conversation, and asking questions for clarification as appropriate. The next appointment will be scheduled in two weeks, which will be via [employer] WebEx. Once this provider's office resumes in-person appointments and it is deemed appropriate, Susan [name] will be notified. The next session will focus on reviewing pleasurable activities and the introduction of triggers for emotional eating, as they were not discussed today based on Jessieca's presenting concerns.

## 2019-06-09 NOTE — Telephone Encounter (Signed)
  Office: [phone]  /  Fax: [phone]  Date of Call: June 09, 2019  Time of Call: 8:02am Provider: Glennie Isle, PsyD  CONTENT: This provider called Estelle to check-in as she did not present for today's Webex appointment at 8:00am. A HIPAA compliant voicemail was unable to be left, as Tenesha's voicemail box was full. Of note, this provider stayed on the [facility] appointment for 10 minutes prior to signing off.   PLAN: This provider will wait for Indya to call back. If deemed necessary, this provider or the provider's clinic will call Leigha again in approximately one week.

## 2019-06-17 ENCOUNTER — Other Ambulatory Visit: Payer: Self-pay | Admitting: Surgery

## 2019-06-17 DIAGNOSIS — Z9884 Bariatric surgery status: Secondary | ICD-10-CM

## 2019-06-18 ENCOUNTER — Other Ambulatory Visit: Payer: Self-pay

## 2019-06-18 DIAGNOSIS — I10 Essential (primary) hypertension: Secondary | ICD-10-CM

## 2019-06-18 MED ORDER — LISINOPRIL 10 MG PO TABS: 10.0000 mg | ORAL_TABLET | Freq: Every day | ORAL | 0 refills | Status: DC

## 2019-06-18 MED ORDER — HYDROCHLOROTHIAZIDE 12.5 MG PO CAPS: 12.5000 mg | ORAL_CAPSULE | Freq: Every day | ORAL | 0 refills | Status: DC

## 2019-06-21 ENCOUNTER — Other Ambulatory Visit: Payer: Self-pay

## 2019-06-21 ENCOUNTER — Ambulatory Visit (INDEPENDENT_AMBULATORY_CARE_PROVIDER_SITE_OTHER): Payer: Managed Care, Other (non HMO) | Admitting: Family Medicine

## 2019-06-21 VITALS — BP 133/78 | HR 75 | Temp 98.8°F | Ht 62.0 in | Wt 173.0 lb

## 2019-06-21 DIAGNOSIS — Z6831 Body mass index (BMI) 31.0-31.9, adult: Secondary | ICD-10-CM | POA: Diagnosis not present

## 2019-06-21 DIAGNOSIS — E669 Obesity, unspecified: Secondary | ICD-10-CM

## 2019-06-21 DIAGNOSIS — E7800 Pure hypercholesterolemia, unspecified: Secondary | ICD-10-CM | POA: Diagnosis not present

## 2019-06-21 DIAGNOSIS — E8881 Metabolic syndrome: Secondary | ICD-10-CM | POA: Diagnosis not present

## 2019-06-22 NOTE — Progress Notes (Signed)
Office: [phone]  /  Fax: [phone]   HPI:   Chief Complaint: OBESITY Susan [name] is here to discuss her progress with her obesity treatment plan. She is on the Category 2 plan and is following her eating plan approximately 0 % of the time. She states she is exercising 0 minutes 0 times per week. Susan [name] is very frustrated with the lack of weight loss, and she has not committed to the meal plan at all. She feels she cannot make a commitment to a meal plan that she doesn't feel will work. Her weight is 173 lb (78.5 kg) today and has had a weight gain of 4 pounds over a period of 2 weeks since her last visit. She has gained 1 lb since starting treatment with Susan [name].  Insulin Resistance Susan [name] has a diagnosis of insulin resistance based on her elevated fasting insulin level >5. Although Susan [name]'s blood glucose readings are still under good control, insulin resistance puts her at greater risk of metabolic syndrome and diabetes. Her last A1c was at 5.6 and last insulin level was at 7.7 Susan [name] didn't disclose much on diet. She is not on metformin.  [name] has [name] and she has a LDL of 226 and HDL of 51. She has been trying to improve her cholesterol levels with intensive lifestyle modification including a low saturated fat diet, exercise and weight loss. She denies any chest pain or myalgias.  ASSESSMENT AND PLAN:  Insulin resistance  Hypercholesteremia  Class 1 obesity with serious comorbidity and body mass index (BMI) of 31.0 to 31.9 in adult, unspecified obesity type  PLAN:  Insulin Resistance Susan [name] will continue to work on weight loss, exercise, and decreasing simple carbohydrates in her diet to help decrease the risk of diabetes. We dicussed metformin including benefits and risks. She was informed that eating too many simple carbohydrates or too many calories at one sitting increases the likelihood of GI side effects. We will follow up labs in 6 months and  Susan [name].  [name]. We discussed many lifestyle modifications today in depth, and Susan [name] will continue to work on decreasing saturated fats such as fatty red meat, butter and many fried foods. She will also increase vegetables and lean protein in her diet and continue to work on exercise and weight loss efforts. We will repeat labs in early winter.  I spent > than 50% of the 15 minute visit on counseling as documented in the note.  Obesity Susan [name] is currently in the action stage of change. As such, her goal is to continue with weight loss efforts She has agreed to follow the Category 2 plan Susan [name] has been instructed to work up to a goal of 150 minutes of combined cardio and strengthening exercise per week for weight loss and overall health benefits. We discussed the following Behavioral Modification Strategies today: planning for success, keeping healthy foods in the home, increasing lean protein intake, increasing vegetables and work on meal planning and easy cooking plans  Susan [name] has agreed to follow up with our clinic as [name]. She was informed of the importance of frequent follow up visits to maximize her success with intensive lifestyle modifications for her multiple health conditions.  ALLERGIES: No Known Allergies  MEDICATIONS: Current Outpatient Medications on File Prior to Visit  Medication Sig Dispense Refill  . hydrochlorothiazide (MICROZIDE) 12.5 MG capsule  Take 1 capsule (12.5 mg total) by mouth daily. 90 capsule 0  . levonorgestrel (MIRENA, 52 MG,) 20 MCG/24HR IUD 1 each by Intrauterine route once. Inserted 2017     . lisinopril (ZESTRIL) 10 MG tablet Take 1 tablet (10 mg total) by mouth daily. 90 tablet 0  . Multiple Vitamin (MULTIVITAMIN) capsule Take by mouth.     No  current facility-administered medications on file prior to visit.     PAST MEDICAL HISTORY: Past Medical History:  Diagnosis Date  . Anemia 2016   with bleeding ulcer  . Arthritis    oa hands  . Constipation   . Dizziness   . GERD (gastroesophageal reflux disease)    no current meds  . GI bleeding   . Glaucoma   . Headache    headaches and migraines  . History of bleeding ulcers   . History of stomach ulcers   . [name]    no cholesterol meds taken  . Hypertension   . Hypothyroidism   . Insomnia   . Iron deficiency   . Irregular periods   . Osteoarthritis of both hands   . PCOS (polycystic ovarian syndrome)   . Pre-diabetes   . Syncope   . Vitamin D deficiency     PAST SURGICAL HISTORY: Past Surgical History:  Procedure Laterality Date  . BILATERAL SALPINGECTOMY Bilateral 03/22/2013   Procedure: BILATERAL SALPINGECTOMY;  Surgeon: Serita KyleSheronette A Cousins, MD;  Location: [facility];  Service: Gynecology;  Laterality: Bilateral;  . CARPAL TUNNEL RELEASE Right 11/15/2013   Procedure: RIGHT CARPAL TUNNEL RELEASE;  Surgeon: Nicki ReaperGary R Kuzma, MD;  Location: [facility];  Service: Orthopedics;  Laterality: Right;  . COSMETIC SURGERY  01/2015   tummy tuck   . ESOPHAGOGASTRODUODENOSCOPY N/A 10/04/2015   Procedure: ESOPHAGOGASTRODUODENOSCOPY (EGD);  Surgeon: Ruffin FrederickSteven Paul Armbruster, MD;  [facility];  Service: Gastroenterology;  Laterality: N/A;  . GANGLION CYST EXCISION Right 11/15/2013   Procedure: EXCISION CYST RIGHT WRIST;  Surgeon: Nicki ReaperGary R Kuzma, MD;  Location: [facility];  Service: Orthopedics;  Laterality: Right;  . LAPAROSCOPIC GASTRIC BAND REMOVAL WITH LAPAROSCOPIC GASTRIC SLEEVE RESECTION N/A 09/23/2017   Procedure: LAPAROSCOPIC GASTRIC BAND REMOVAL WITH LAPAROSCOPIC GASTRIC SLEEVE RESECTION;  Surgeon: Luretha MurphyMartin, Matthew, MD;  Location: [facility];  Service: General;  Laterality: N/A;  . LAPAROSCOPIC GASTRIC BANDING  2010  . LAPAROSCOPY N/A  03/22/2013   Procedure: LAPAROSCOPY OPERATIVE;  Surgeon: Serita KyleSheronette A Cousins, MD;  Location: [facility];  Service: Gynecology;  Laterality: N/A;  . MOUTH SURGERY  1991  . SALPINGECTOMY  03/22/2013    SOCIAL HISTORY: Social History   Tobacco Use  . Smoking status: Never Smoker  . Smokeless tobacco: Never Used  Substance Use Topics  . Alcohol use: Yes    Alcohol/week: 1.0 standard drinks    Types: 1 Standard drinks or equivalent per week    Comment: socially  . Drug use: No    FAMILY HISTORY: Family History  Problem Relation Age of Onset  . Cancer Mother        pancreatic  . Stroke Mother   . Diabetes Mother   . Diabetes gravidarum Mother   . High blood pressure Mother   . Heart disease Mother   . Obesity Mother   . Hypertension Father   . Ulcers Father   . Diabetes Father   . High blood pressure Father   . Heart disease Father   . Thyroid disease Father   . Hypertension Brother  ROS: Review of Systems  Constitutional: Negative for weight loss.  Cardiovascular: Negative for chest pain.  Musculoskeletal: Negative for myalgias.    PHYSICAL EXAM: Blood pressure 133/78, pulse 75, temperature 98.8 F (37.1 C), temperature source Oral, height 5\' 2"  (1.575 m), weight 173 lb (78.5 kg), SpO2 98 %. Body mass index is 31.64 kg/m. Physical Exam  RECENT LABS AND TESTS: BMET    Component Value Date/Time   NA 139 03/18/2019 1118   K 4.1 03/18/2019 1118   CL 97 03/18/2019 1118   CO2 25 03/18/2019 1118   GLUCOSE 85 03/18/2019 1118   GLUCOSE 99 09/16/2017 1348   BUN 9 03/18/2019 1118   CREATININE 1.01 (H) 03/18/2019 1118   CALCIUM 9.8 03/18/2019 1118   GFRNONAA 67 03/18/2019 1118   GFRAA 77 03/18/2019 1118   Lab Results  Component Value Date   HGBA1C 5.6 03/18/2019   HGBA1C 5.5 09/10/2018   HGBA1C 5.8 (H) 09/16/2017   Lab Results  Component Value Date   INSULIN 7.7 03/18/2019   CBC    Component Value Date/Time   WBC 5.3 03/18/2019 1118   WBC 13.7 (H)  09/24/2017 0538   RBC 4.41 03/18/2019 1118   RBC 3.56 (L) 09/24/2017 0538   HGB 14.3 03/18/2019 1118   HCT 41.7 03/18/2019 1118   PLT 316 03/18/2019 1118   MCV 95 03/18/2019 1118   MCH 32.4 03/18/2019 1118   MCH 30.1 09/24/2017 0538   MCHC 34.3 03/18/2019 1118   MCHC 32.9 09/24/2017 0538   RDW 12.4 03/18/2019 1118   LYMPHSABS 1.5 09/24/2017 0538   MONOABS 0.6 09/24/2017 0538   EOSABS 0.0 09/24/2017 0538   BASOSABS 0.0 09/24/2017 0538   Iron/TIBC/Ferritin/ %Sat    Component Value Date/Time   IRON 114 10/03/2015 1908   TIBC 329 10/03/2015 1908   FERRITIN 15 10/03/2015 1908   IRONPCTSAT 35 (H) 10/03/2015 1908   Lipid Panel     Component Value Date/Time   CHOL 293 (H) 03/18/2019 1118   TRIG 80 03/18/2019 1118   HDL 51 03/18/2019 1118   CHOLHDL 5.7 (H) 03/18/2019 1118   LDLCALC 226 (H) 03/18/2019 1118   Hepatic Function Panel     Component Value Date/Time   PROT 7.8 03/18/2019 1118   ALBUMIN 4.5 03/18/2019 1118   AST 17 03/18/2019 1118   ALT 10 03/18/2019 1118   ALKPHOS 68 03/18/2019 1118   BILITOT 0.5 03/18/2019 1118   BILIDIR <0.1 (L) 09/23/2017 1146   IBILI NOT CALCULATED 09/23/2017 1146      Component Value Date/Time   TSH 1.520 05/04/2019 0000   TSH 1.140 09/10/2018 1641     Ref. Range 03/18/2019 11:18  Vitamin D, 25-Hydroxy Latest Ref Range: 30.0 - 100.0 ng/mL 72.8    OBESITY BEHAVIORAL INTERVENTION VISIT  Today's visit was # 4   Starting weight: 172 lbs Starting date: 05/04/2019 Today's weight : 173 lbs Today's date: 06/21/2019 Total lbs lost to date: 0    06/21/2019  Height 5\' 2"  (1.575 m)  Weight 173 lb (78.5 kg)  BMI (Calculated) 31.63  BLOOD PRESSURE - SYSTOLIC 127  BLOOD PRESSURE - DIASTOLIC 78   Body Fat % 51.7 %  Total Body Water (lbs) 72.2 lbs    ASK: We discussed the diagnosis of obesity with Susan [name] today and Susan [name] agreed to give [place] permission to discuss obesity behavioral modification therapy today.  ASSESS: Susan [name] has  the diagnosis of obesity and her BMI today is 00.17 Keaja is in the  action stage of change   ADVISE: Susan [name] was educated on the multiple health risks of obesity as well as the benefit of weight loss to improve her health. She was advised of the need for long term treatment and the importance of lifestyle modifications to improve her current health and to decrease her risk of future health problems.  AGREE: Multiple dietary modification options and treatment options were discussed and  Susan [name] agreed to follow the recommendations documented in the above note.  ARRANGE: Susan [name] was educated on the importance of frequent visits to treat obesity as outlined per CMS and USPSTF guidelines and agreed to schedule her next follow up appointment today.  I, [place] CraneJoanne Murray, am acting as transcriptionist for Filbert SchilderAlexandria U. Kadolph, MD  I have reviewed the above documentation for accuracy and completeness, and I agree with the above. - Debbra RidingAlexandria Kadolph, MD

## 2019-06-22 NOTE — Progress Notes (Unsigned)
Office: [phone]  /  Fax: [phone]    Date: June 28, 2019   Appointment Start Time:*** Duration:*** Provider: Glennie Isle, Psy.D. Type of Session: Individual Therapy  Location of Patient: *** Location of Provider: {Location of Service:[address]} Type of Contact: Telepsychological Visit via [employer] WebEx   Session Content: Amit is a 48 y.o. female presenting via [place] for a follow-up appointment to address the previously established treatment goal of decreasing emotional eating. Today's appointment was a telepsychological visit, as this provider's clinic is seeing a limited number of patients for in-person visits due to COVID-19. Therapeutic services will resume to in-person appointments once deemed appropriate. Kayanna expressed understanding regarding the rationale for telepsychological services, and provided verbal consent for today's appointment. Prior to proceeding with today's appointment, Sally's physical location at the time of this appointment was obtained. Shyna reported she was at *** and provided the address. In the event of technical difficulties, Shirly shared a phone number she could be reached at. Aiman and this provider participated in today's telepsychological service. Also, Clairessa denied anyone else being present in the room or on the WebEx appointment ***.  This provider conducted a brief check-in and verbally administered the PHQ-9 and GAD-7. ***   Yalda was receptive to today's session as evidenced by openness to sharing, responsiveness to feedback, and ***.  Mental Status Examination:  Appearance: {Appearance:22431} Behavior: {Behavior:22445} Mood: {Teletherapy mood:22435} Affect: {Affect:22436} Speech: {Speech:22432} Eye Contact: {Eye Contact:22433} Psychomotor Activity: {Motor Activity:22434} Thought Process: {thought process:22448}  Content/Perceptual Disturbances: {disturbances:22451} Orientation: {Orientation:22437}  Cognition/Sensorium: {gbcognition:22449} Insight: {Insight:22446} Judgment: {Insight:22446}  Structured Assessment Results: The Patient Health Questionnaire-9 (PHQ-9) is a self-report measure that assesses symptoms and severity of depression over the course of the last two weeks. Marycarmen obtained a score of *** suggesting {GBPHQ9SEVERITY:21752}. Glayds finds the endorsed symptoms to be {gbphq9difficulty:21754}. Little interest or pleasure in doing things ***  Feeling down, depressed, or hopeless ***  Trouble falling or staying asleep, or sleeping too much ***  Feeling tired or having little energy ***  Poor appetite or overeating ***  Feeling bad about yourself --- or that you are a failure or have let yourself or your family down ***  Trouble concentrating on things, such as reading the newspaper or watching television ***  Moving or speaking so slowly that other people could have noticed? Or the opposite --- being so fidgety or restless that you have been moving around a lot more than usual ***  Thoughts that you would be better off dead or hurting yourself in some way ***  PHQ-9 Score ***    The Generalized Anxiety Disorder-7 (GAD-7) is a brief self-report measure that assesses symptoms of anxiety over the course of the last two weeks. Merlina obtained a score of *** suggesting {gbgad7severity:21753}. Lille finds the endorsed symptoms to be {gbphq9difficulty:21754}. Feeling nervous, anxious, on edge ***  Not being able to stop or control worrying ***  Worrying too much about different things ***  Trouble relaxing ***  Being so restless that it's hard to sit still ***  Becoming easily annoyed or irritable ***  Feeling afraid as if something awful might happen ***  GAD-7 Score ***   Interventions:  {Interventions:22172}  DSM-5 Diagnosis: 311 (F32.8) Other Specified Depressive Disorder, Emotional Eating Behaviors  Treatment Goal & Progress: During the initial appointment with this  provider, the following treatment goal was established: decrease emotional eating. Maija has demonstrated progress in her goal as evidenced by ***  Plan: Yeilyn continues to appear able  and willing to participate as evidenced by engagement in reciprocal conversation, and asking questions for clarification as appropriate. The next appointment will be scheduled in {gbweeks:21758}, which will be via [employer] WebEx. Once this provider's office resumes in-person appointments and it is deemed appropriate, Sunny SchleinFelicia will be notified. The next session will focus on reviewing learned skills, and working towards the established treatment goal.***

## 2019-06-28 ENCOUNTER — Ambulatory Visit (INDEPENDENT_AMBULATORY_CARE_PROVIDER_SITE_OTHER): Payer: Self-pay | Admitting: Psychology

## 2019-06-28 ENCOUNTER — Telehealth (INDEPENDENT_AMBULATORY_CARE_PROVIDER_SITE_OTHER): Payer: Self-pay | Admitting: Psychology

## 2019-06-28 NOTE — Telephone Encounter (Signed)
  Office: [phone]  /  Fax: [phone]  Date of Call: June 28, 2019  Time of Call: 10:02am Provider: Glennie Isle, PsyD  CONTENT:  This provider called Jyllian to check-in as she did not present for today's Webex appointment at 10:00am. A HIPAA compliant voicemail could not be left as Iridessa's voicemail box was full. Of note, this provider stayed on the [facility] appointment for 7 minutes prior to signing off, which is 2 additional minutes past the clinic's 5 minute grace period policy.    PLAN: This provider will wait for Kathia to call back. If deemed necessary, this provider or the provider's clinic will call Misti again in approximately one week.

## 2019-07-06 ENCOUNTER — Other Ambulatory Visit: Payer: Self-pay

## 2019-07-08 ENCOUNTER — Encounter (INDEPENDENT_AMBULATORY_CARE_PROVIDER_SITE_OTHER): Payer: Self-pay

## 2019-09-09 ENCOUNTER — Ambulatory Visit (INDEPENDENT_AMBULATORY_CARE_PROVIDER_SITE_OTHER): Payer: Managed Care, Other (non HMO) | Admitting: Internal Medicine

## 2019-09-09 ENCOUNTER — Encounter: Payer: Self-pay | Admitting: Internal Medicine

## 2019-09-09 ENCOUNTER — Other Ambulatory Visit: Payer: Self-pay

## 2019-09-09 VITALS — BP 138/88 | HR 85 | Temp 98.4°F | Ht 63.2 in | Wt 179.2 lb

## 2019-09-09 DIAGNOSIS — N951 Menopausal and female climacteric states: Secondary | ICD-10-CM | POA: Diagnosis not present

## 2019-09-09 DIAGNOSIS — F5101 Primary insomnia: Secondary | ICD-10-CM

## 2019-09-09 DIAGNOSIS — Z6831 Body mass index (BMI) 31.0-31.9, adult: Secondary | ICD-10-CM

## 2019-09-09 DIAGNOSIS — E6609 Other obesity due to excess calories: Secondary | ICD-10-CM

## 2019-09-09 DIAGNOSIS — R635 Abnormal weight gain: Secondary | ICD-10-CM

## 2019-09-09 DIAGNOSIS — I10 Essential (primary) hypertension: Secondary | ICD-10-CM

## 2019-09-09 MED ORDER — DAYVIGO 5 MG PO TABS: 5.0000 mg | ORAL_TABLET | Freq: Every evening | ORAL | 0 refills | Status: DC | PRN

## 2019-09-09 NOTE — Patient Instructions (Addendum)
Remifemin nighttime - one nightly  -- you may get over the counter  Menopause Menopause is the normal time of life when menstrual periods stop completely. It is usually confirmed by 12 months without a menstrual period. The transition to menopause (perimenopause) most often happens between the ages of 87 and 71. During perimenopause, hormone levels change in your body, which can cause symptoms and affect your health. Menopause may increase your risk for:  Loss of bone (osteoporosis), which causes bone breaks (fractures).  Depression.  Hardening and narrowing of the arteries (atherosclerosis), which can cause heart attacks and strokes. What are the causes? This condition is usually caused by a natural change in hormone levels that happens as you get older. The condition may also be caused by surgery to remove both ovaries (bilateral oophorectomy). What increases the risk? This condition is more likely to start at an earlier age if you have certain medical conditions or treatments, including:  A tumor of the pituitary gland in the brain.  A disease that affects the ovaries and hormone production.  Radiation treatment for cancer.  Certain cancer treatments, such as chemotherapy or hormone (anti-estrogen) therapy.  Heavy smoking and excessive alcohol use.  Family history of early menopause. This condition is also more likely to develop earlier in women who are very thin. What are the signs or symptoms? Symptoms of this condition include:  Hot flashes.  Irregular menstrual periods.  Night sweats.  Changes in feelings about sex. This could be a decrease in sex drive or an increased comfort around your sexuality.  Vaginal dryness and thinning of the vaginal walls. This may cause painful intercourse.  Dryness of the skin and development of wrinkles.  Headaches.  Problems sleeping (insomnia).  Mood swings or irritability.  Memory problems.  Weight gain.  Hair growth on  the face and chest.  Bladder infections or problems with urinating. How is this diagnosed? This condition is diagnosed based on your medical history, a physical exam, your age, your menstrual history, and your symptoms. Hormone tests may also be done. How is this treated? In some cases, no treatment is needed. You and your health care provider should make a decision together about whether treatment is necessary. Treatment will be based on your individual condition and preferences. Treatment for this condition focuses on managing symptoms. Treatment may include:  Menopausal hormone therapy (MHT).  Medicines to treat specific symptoms or complications.  Acupuncture.  Vitamin or herbal supplements. Before starting treatment, make sure to let your health care provider know if you have a personal or family history of:  Heart disease.  Breast cancer.  Blood clots.  Diabetes.  Osteoporosis. Follow these instructions at home: Lifestyle  Do not use any products that contain nicotine or tobacco, such as cigarettes and e-cigarettes. If you need help quitting, ask your health care provider.  Get at least 30 minutes of physical activity on 5 or more days each week.  Avoid alcoholic and caffeinated beverages, as well as spicy foods. This may help prevent hot flashes.  Get 7-8 hours of sleep each night.  If you have hot flashes, try: ? Dressing in layers. ? Avoiding things that may trigger hot flashes, such as spicy food, warm places, or stress. ? Taking slow, deep breaths when a hot flash starts. ? Keeping a fan in your home and office.  Find ways to manage stress, such as deep breathing, meditation, or journaling.  Consider going to group therapy with other women who are having  menopause symptoms. Ask your health care provider about recommended group therapy meetings. Eating and drinking  Eat a healthy, balanced diet that contains whole grains, lean protein, low-fat dairy, and  plenty of fruits and vegetables.  Your health care provider may recommend adding more soy to your diet. Foods that contain soy include tofu, tempeh, and soy milk.  Eat plenty of foods that contain calcium and vitamin D for bone health. Items that are rich in calcium include low-fat milk, yogurt, beans, almonds, sardines, broccoli, and kale. Medicines  Take over-the-counter and prescription medicines only as told by your health care provider.  Talk with your health care provider before starting any herbal supplements. If prescribed, take vitamins and supplements as told by your health care provider. These may include: ? Calcium. Women age 42 and older should get 1,200 mg (milligrams) of calcium every day. ? Vitamin D. Women need 600-800 International Units of vitamin D each day. ? Vitamins B12 and B6. Aim for 50 micrograms of B12 and 1.5 mg of B6 each day. General instructions  Keep track of your menstrual periods, including: ? When they occur. ? How heavy they are and how long they last. ? How much time passes between periods.  Keep track of your symptoms, noting when they start, how often you have them, and how long they last.  Use vaginal lubricants or moisturizers to help with vaginal dryness and improve comfort during sex.  Keep all follow-up visits as told by your health care provider. This is important. This includes any group therapy or counseling. Contact a health care provider if:  You are still having menstrual periods after age 60.  You have pain during sex.  You have not had a period for 12 months and you develop vaginal bleeding. Get help right away if:  You have: ? Severe depression. ? Excessive vaginal bleeding. ? Pain when you urinate. ? A fast or irregular heart beat (palpitations). ? Severe headaches. ? Abdomen (abdominal) pain or severe indigestion.  You fell and you think you have a broken bone.  You develop leg or chest pain.  You develop vision  problems.  You feel a lump in your breast. Summary  Menopause is the normal time of life when menstrual periods stop completely. It is usually confirmed by 12 months without a menstrual period.  The transition to menopause (perimenopause) most often happens between the ages of 66 and 76.  Symptoms can be managed through medicines, lifestyle changes, and complementary therapies such as acupuncture.  Eat a balanced diet that is rich in nutrients to promote bone health and heart health and to manage symptoms during menopause. This information is not intended to replace advice given to you by your health care provider. Make sure you discuss any questions you have with your health care provider. Document Released: 02/01/2004 Document Revised: 10/24/2017 Document Reviewed: 12/14/2016 Elsevier Patient Education  2020 [employer].

## 2019-09-10 LAB — CMP14+EGFR
ALT: 8 IU/L (ref 0–32)
AST: 18 IU/L (ref 0–40)
Albumin/Globulin Ratio: 1.7 (ref 1.2–2.2)
Albumin: 4.3 g/dL (ref 3.8–4.8)
Alkaline Phosphatase: 59 IU/L (ref 39–117)
BUN/Creatinine Ratio: 13 (ref 9–23)
BUN: 12 mg/dL (ref 6–24)
Bilirubin Total: 0.3 mg/dL (ref 0.0–1.2)
CO2: 27 mmol/L (ref 20–29)
Calcium: 9.2 mg/dL (ref 8.7–10.2)
Chloride: 102 mmol/L (ref 96–106)
Creatinine, Ser: 0.96 mg/dL (ref 0.57–1.00)
GFR calc Af Amer: 81 mL/min/{1.73_m2} (ref >59)
GFR calc non Af Amer: 71 mL/min/{1.73_m2} (ref >59)
Globulin, Total: 2.5 g/dL (ref 1.5–4.5)
Glucose: 85 mg/dL (ref 65–99)
Potassium: 4.2 mmol/L (ref 3.5–5.2)
Sodium: 140 mmol/L (ref 134–144)
Total Protein: 6.8 g/dL (ref 6.0–8.5)

## 2019-09-10 LAB — HEMOGLOBIN A1C
Est. average glucose Bld gHb Est-mCnc: 114 mg/dL
Hgb A1c MFr Bld: 5.6 % (ref 4.8–5.6)

## 2019-09-10 LAB — INSULIN, RANDOM: INSULIN: 4.2 u[IU]/mL (ref 2.6–24.9)

## 2019-09-11 NOTE — Progress Notes (Signed)
Subjective:     Patient ID: Susan [name] , female    DOB: 1971/07/02 , 48 y.o.   MRN: [id number]   Chief Complaint  Patient presents with  . Hypertension    HPI  Hypertension This is a chronic problem. The current episode started more than 1 year ago. The problem has been gradually improving since onset. The problem is controlled. Pertinent negatives include no blurred vision, chest pain or palpitations. Risk factors for coronary artery disease include obesity and stress. Past treatments include ACE inhibitors. The current treatment provides moderate improvement.     Past Medical History:  Diagnosis Date  . Anemia 2016   with bleeding ulcer  . Arthritis    oa hands  . Constipation   . Dizziness   . GERD (gastroesophageal reflux disease)    no current meds  . GI bleeding   . Glaucoma   . Headache    headaches and migraines  . History of bleeding ulcers   . History of stomach ulcers   . Hyperlipidemia    no cholesterol meds taken  . Hypertension   . Hypothyroidism   . Insomnia   . Iron deficiency   . Irregular periods   . Osteoarthritis of both hands   . PCOS (polycystic ovarian syndrome)   . Pre-diabetes   . Syncope   . Vitamin D deficiency      Family History  Problem Relation Age of Onset  . Cancer Mother        pancreatic  . Stroke Mother   . Diabetes Mother   . Diabetes gravidarum Mother   . High blood pressure Mother   . Heart disease Mother   . Obesity Mother   . Hypertension Father   . Ulcers Father   . Diabetes Father   . High blood pressure Father   . Heart disease Father   . Thyroid disease Father   . Hypertension Brother      Current Outpatient Medications:  .  hydrochlorothiazide (MICROZIDE) 12.5 MG capsule, Take 1 capsule (12.5 mg total) by mouth daily., Disp: 90 capsule, Rfl: 0 .  levonorgestrel (MIRENA, 52 MG,) 20 MCG/24HR IUD, 1 each by Intrauterine route once. Inserted 2017 , Disp: , Rfl:  .  lisinopril (ZESTRIL) 10 MG tablet,  Take 1 tablet (10 mg total) by mouth daily., Disp: 90 tablet, Rfl: 0 .  Multiple Vitamin (MULTIVITAMIN) capsule, Take by mouth., Disp: , Rfl:  .  Lemborexant (DAYVIGO) 5 MG TABS, Take 5 mg by mouth at bedtime as needed., Disp: 10 tablet, Rfl: 0   No Known Allergies   Review of Systems  Constitutional: Positive for unexpected weight change.  Eyes: Negative for blurred vision.  Respiratory: Negative.   Cardiovascular: Negative.  Negative for chest pain and palpitations.  Gastrointestinal: Negative.   Genitourinary:       She c/o night sweats and hot flashes. She is still having regular cycles. She reports her sx have become quite unbearable.   Neurological: Negative.   Psychiatric/Behavioral: Positive for sleep disturbance.     Today's Vitals   09/09/19 1408  BP: 138/88  Pulse: 85  Temp: 98.4 F (36.9 C)  TempSrc: Oral  SpO2: 98%  Weight: 179 lb 3.2 oz (81.3 kg)  Height: 5' 3.2" (1.605 m)   Body mass index is 31.54 kg/m.   Objective:  Physical Exam Vitals signs and nursing note reviewed.  Constitutional:      Appearance: Normal appearance.  HENT:     Head:  Normocephalic and atraumatic.  Cardiovascular:     Rate and Rhythm: Normal rate and regular rhythm.     Heart sounds: Normal heart sounds.  Pulmonary:     Effort: Pulmonary effort is normal.     Breath sounds: Normal breath sounds.  Skin:    General: Skin is warm.  Neurological:     General: No focal deficit present.     Mental Status: She is alert.  Psychiatric:        Mood and Affect: Mood normal.        Behavior: Behavior normal.         Assessment And Plan:     1. Essential hypertension, benign  Chronic, fair control. She will continue with current meds for now. I will consider switching to an ARB at a later date. She is encouraged to avoid adding salt to her foods.   - CMP14+EGFR  2. Female climacteric state  She is encouraged to follow a "clean" diet free from processed/packaged foods. She may  benefit from Remifemin nightly.   3. Weight gain  I will check labs as listed below.  She is encouraged to incorporate more exercise into her daily routine - to aim for 150 minutes per week.   - Insulin, random(561) - Hemoglobin A1c  4. Primary insomnia  Chronic, possibly related to perimenopausal status. She may benefit from progesterone supplementation. She was given saliva kit to complete in the past; however, she never finished it. She is encouraged to add magnesium (Calm) supplementation nightly.   5. Class 1 obesity due to excess calories with serious comorbidity and body mass index (BMI) of 31.0 to 31.9 in adult  She [facility] no longer [facility]. I will check insulin level today. We may be able to address her condition with a GLP-1 agent to help augment her weight loss efforts.      Maximino Greenland, MD    THE PATIENT IS ENCOURAGED TO PRACTICE SOCIAL DISTANCING DUE TO THE COVID-19 PANDEMIC.

## 2019-09-13 ENCOUNTER — Telehealth: Payer: Self-pay

## 2019-09-13 NOTE — Telephone Encounter (Signed)
I called and left the pt a message to call the office back.  I was calling the pt because Dr. Baird Cancer wanted to know if the pt has picked up her rx of Dayvigo with the savings card and if so is the mediation helping?

## 2019-10-11 ENCOUNTER — Other Ambulatory Visit: Payer: Self-pay

## 2019-10-11 DIAGNOSIS — I10 Essential (primary) hypertension: Secondary | ICD-10-CM

## 2019-10-11 MED ORDER — HYDROCHLOROTHIAZIDE 12.5 MG PO CAPS: 12.5000 mg | ORAL_CAPSULE | Freq: Every day | ORAL | 0 refills | Status: DC

## 2019-10-29 ENCOUNTER — Encounter: Payer: Self-pay | Admitting: Internal Medicine

## 2019-10-29 ENCOUNTER — Ambulatory Visit (INDEPENDENT_AMBULATORY_CARE_PROVIDER_SITE_OTHER): Payer: Managed Care, Other (non HMO) | Admitting: Internal Medicine

## 2019-10-29 ENCOUNTER — Other Ambulatory Visit: Payer: Self-pay

## 2019-10-29 VITALS — BP 122/86 | HR 103 | Temp 97.5°F | Ht 63.2 in | Wt 175.6 lb

## 2019-10-29 DIAGNOSIS — Z23 Encounter for immunization: Secondary | ICD-10-CM

## 2019-10-29 DIAGNOSIS — F5101 Primary insomnia: Secondary | ICD-10-CM | POA: Diagnosis not present

## 2019-10-29 DIAGNOSIS — Z6830 Body mass index (BMI) 30.0-30.9, adult: Secondary | ICD-10-CM | POA: Diagnosis not present

## 2019-10-29 MED ORDER — GABAPENTIN 100 MG PO CAPS: 100.0000 mg | ORAL_CAPSULE | Freq: Every day | ORAL | 0 refills | Status: DC

## 2019-10-29 NOTE — Progress Notes (Signed)
This visit occurred during the SARS-CoV-2 public health emergency.  Safety protocols were in place, including screening questions prior to the visit, additional usage of staff PPE, and extensive cleaning of exam room while observing appropriate contact time as indicated for disinfecting solutions.  Subjective:     Patient ID: Susan [name] , female    DOB: [birth year] , 48 y.o.   MRN: [id number]   Chief Complaint  Patient presents with  . Insomnia  . Obesity    HPI  She is here today for f/u insomnia and obesity. She reports Dayvigo did not help with her insomnia. She reports that the persistent insomnia has started to affect how she feels at work. She is quite sluggish. She is too tired to exercise. Sometimes she has to drink 1-2 glasses of wine to get to sleep.     Past Medical History:  Diagnosis Date  . Anemia 2016   with bleeding ulcer  . Arthritis    oa hands  . Constipation   . Dizziness   . GERD (gastroesophageal reflux disease)    no current meds  . GI bleeding   . Glaucoma   . Headache    headaches and migraines  . History of bleeding ulcers   . History of stomach ulcers   . Hyperlipidemia    no cholesterol meds taken  . Hypertension   . Hypothyroidism   . Insomnia   . Iron deficiency   . Irregular periods   . Osteoarthritis of both hands   . PCOS (polycystic ovarian syndrome)   . Pre-diabetes   . Syncope   . Vitamin D deficiency      Family History  Problem Relation Age of Onset  . Cancer Mother        pancreatic  . Stroke Mother   . Diabetes Mother   . Diabetes gravidarum Mother   . High blood pressure Mother   . Heart disease Mother   . Obesity Mother   . Hypertension Father   . Ulcers Father   . Diabetes Father   . High blood pressure Father   . Heart disease Father   . Thyroid disease Father   . Hypertension Brother      Current Outpatient Medications:  .  gabapentin (NEURONTIN) 100 MG capsule, Take 1 capsule (100 mg total) by mouth  at bedtime., Disp: 90 capsule, Rfl: 0 .  hydrochlorothiazide (MICROZIDE) 12.5 MG capsule, Take 1 capsule (12.5 mg total) by mouth daily., Disp: 90 capsule, Rfl: 0 .  Lemborexant (DAYVIGO) 5 MG TABS, Take 5 mg by mouth at bedtime as needed., Disp: 10 tablet, Rfl: 0 .  levonorgestrel (MIRENA, 52 MG,) 20 MCG/24HR IUD, 1 each by Intrauterine route once. Inserted 2017 , Disp: , Rfl:  .  lisinopril (ZESTRIL) 10 MG tablet, Take 1 tablet (10 mg total) by mouth daily., Disp: 90 tablet, Rfl: 0 .  Multiple Vitamin (MULTIVITAMIN) capsule, Take by mouth., Disp: , Rfl:    No Known Allergies   Review of Systems  Constitutional: Negative.   Respiratory: Negative.   Cardiovascular: Negative.   Gastrointestinal: Negative.   Neurological: Negative.   Psychiatric/Behavioral: Positive for sleep disturbance.     Today's Vitals   10/29/19 1504  BP: 122/86  Pulse: (!) 103  Temp: (!) 97.5 F (36.4 C)  TempSrc: Oral  Weight: 175 lb 9.6 oz (79.7 kg)  Height: 5' 3.2" (1.605 m)   Body mass index is 30.91 kg/m.   Objective:  Physical Exam Vitals signs  and nursing note reviewed.  Constitutional:      Appearance: Normal appearance.  HENT:     Head: Normocephalic and atraumatic.  Cardiovascular:     Rate and Rhythm: Normal rate and regular rhythm.     Heart sounds: Normal heart sounds.  Pulmonary:     Effort: Pulmonary effort is normal.     Breath sounds: Normal breath sounds.  Skin:    General: Skin is warm.  Neurological:     General: No focal deficit present.     Mental Status: She is alert.  Psychiatric:        Mood and Affect: Mood normal.        Behavior: Behavior normal.         Assessment And Plan:     1. Primary insomnia  Chronic. She agreed to try gabapentin nightly. Unfortunately, she has tried rx sedative-hypnotics without improvement in her sx. I will start with gabapentin, 100mg  nightly. She was advised to avoid alcohol while taking this medication. She is encouraged to f/u  with me in two weeks to let me know how she is doing. She will f/u six weeks for re-evaluation.   2. Body mass index (BMI)30.0-30.9, adult  She is encouraged to incorporate more exercise into her daily routine. She is encouraged to aim for at least 150 minutes per week of aerobic exercise.  3. Need for vaccination  - Flu Vaccine QUAD 6+ mos PF IM (Fluarix Quad PF)        05-25-1997, MD    THE PATIENT IS ENCOURAGED TO PRACTICE SOCIAL DISTANCING DUE TO THE COVID-19 PANDEMIC.   x

## 2019-12-16 ENCOUNTER — Other Ambulatory Visit: Payer: Self-pay

## 2019-12-16 ENCOUNTER — Ambulatory Visit: Payer: Managed Care, Other (non HMO) | Admitting: Internal Medicine

## 2019-12-16 ENCOUNTER — Encounter: Payer: Self-pay | Admitting: Internal Medicine

## 2019-12-16 VITALS — BP 114/68 | HR 95 | Temp 98.9°F | Ht 63.2 in | Wt 176.8 lb

## 2019-12-16 DIAGNOSIS — F5101 Primary insomnia: Secondary | ICD-10-CM

## 2019-12-16 DIAGNOSIS — Z6831 Body mass index (BMI) 31.0-31.9, adult: Secondary | ICD-10-CM | POA: Diagnosis not present

## 2019-12-16 DIAGNOSIS — I10 Essential (primary) hypertension: Secondary | ICD-10-CM | POA: Diagnosis not present

## 2019-12-18 NOTE — Progress Notes (Signed)
This visit occurred during the SARS-CoV-2 public health emergency.  Safety protocols were in place, including screening questions prior to the visit, additional usage of staff PPE, and extensive cleaning of exam room while observing appropriate contact time as indicated for disinfecting solutions.  Subjective:     Patient ID: Susan [name] , female    DOB: [birth year] , 49 y.o.   MRN: [id number]   Chief Complaint  Patient presents with  . Hypertension    HPI  She is here today for f/u HTN and insomnia. She reports that she has had some improvement with gabapentin. She is up to 2 capsules nightly. She finds that this helps her to fall asleep, but she doesn't stay asleep.   Hypertension This is a chronic problem. The current episode started more than 1 year ago. The problem has been gradually improving since onset. The problem is controlled. Pertinent negatives include no blurred vision, chest pain or palpitations. Risk factors for coronary artery disease include obesity and stress. Past treatments include ACE inhibitors. The current treatment provides moderate improvement.     Past Medical History:  Diagnosis Date  . Anemia 2016   with bleeding ulcer  . Arthritis    oa hands  . Constipation   . Dizziness   . GERD (gastroesophageal reflux disease)    no current meds  . GI bleeding   . Glaucoma   . Headache    headaches and migraines  . History of bleeding ulcers   . History of stomach ulcers   . Hyperlipidemia    no cholesterol meds taken  . Hypertension   . Hypothyroidism   . Insomnia   . Iron deficiency   . Irregular periods   . Osteoarthritis of both hands   . PCOS (polycystic ovarian syndrome)   . Pre-diabetes   . Syncope   . Vitamin D deficiency      Family History  Problem Relation Age of Onset  . Cancer Mother        pancreatic  . Stroke Mother   . Diabetes Mother   . Diabetes gravidarum Mother   . High blood pressure Mother   . Heart disease Mother    . Obesity Mother   . Hypertension Father   . Ulcers Father   . Diabetes Father   . High blood pressure Father   . Heart disease Father   . Thyroid disease Father   . Hypertension Brother      Current Outpatient Medications:  .  gabapentin (NEURONTIN) 100 MG capsule, Take 1 capsule (100 mg total) by mouth at bedtime., Disp: 90 capsule, Rfl: 0 .  hydrochlorothiazide (MICROZIDE) 12.5 MG capsule, Take 1 capsule (12.5 mg total) by mouth daily., Disp: 90 capsule, Rfl: 0 .  levonorgestrel (MIRENA, 52 MG,) 20 MCG/24HR IUD, 1 each by Intrauterine route once. Inserted 2017 , Disp: , Rfl:  .  lisinopril (ZESTRIL) 10 MG tablet, Take 1 tablet (10 mg total) by mouth daily., Disp: 90 tablet, Rfl: 0 .  Multiple Vitamin (MULTIVITAMIN) capsule, Take by mouth., Disp: , Rfl:  .  Lemborexant (DAYVIGO) 5 MG TABS, Take 5 mg by mouth at bedtime as needed. (Patient not taking: Reported on 12/16/2019), Disp: 10 tablet, Rfl: 0   No Known Allergies   Review of Systems  Constitutional: Negative.   Eyes: Negative for blurred vision.  Respiratory: Negative.   Cardiovascular: Negative.  Negative for chest pain and palpitations.  Gastrointestinal: Negative.   Neurological: Negative.   Psychiatric/Behavioral: Positive for  sleep disturbance.     Today's Vitals   12/16/19 1618  BP: 114/68  Pulse: 95  Temp: 98.9 F (37.2 C)  TempSrc: Oral  Weight: 176 lb 12.8 oz (80.2 kg)  Height: 5' 3.2" (1.605 m)   Body mass index is 31.12 kg/m.   Objective:  Physical Exam Vitals and nursing note reviewed.  Constitutional:      Appearance: Normal appearance.  HENT:     Head: Normocephalic and atraumatic.  Cardiovascular:     Rate and Rhythm: Normal rate and regular rhythm.     Heart sounds: Normal heart sounds.  Pulmonary:     Effort: Pulmonary effort is normal.     Breath sounds: Normal breath sounds.  Skin:    General: Skin is warm.  Neurological:     General: No focal deficit present.     Mental Status:  She is alert.  Psychiatric:        Mood and Affect: Mood normal.        Behavior: Behavior normal.         Assessment And Plan:     1. Essential hypertension, benign  Chronic, well controlled. Renal function checked Oct 2020 was normal. She will continue with current meds.   2. Primary insomnia  Chronic. She will increase gabapentin to 3 capsules nightly. She will let me know if this is effective next week. If not, I will consider use of trazodone nightly. She was also reminded that alcohol use can result in poor sleep quality as well, even though it may help her to fall asleep quicker.   3. BMI 31.0-31.9,adult  She is up one pound since Dec 2020. She is encouraged to incorporate more exercise into her daily routine. She is advised to aim for at least 30 minutes five days per week.   Maximino Greenland, MD    THE PATIENT IS ENCOURAGED TO PRACTICE SOCIAL DISTANCING DUE TO THE COVID-19 PANDEMIC.

## 2019-12-21 ENCOUNTER — Other Ambulatory Visit: Payer: Self-pay | Admitting: Internal Medicine

## 2019-12-21 MED ORDER — ESZOPICLONE 3 MG PO TABS: 3.0000 mg | ORAL_TABLET | Freq: Every evening | ORAL | 1 refills | Status: DC | PRN

## 2020-01-06 ENCOUNTER — Ambulatory Visit: Payer: Self-pay | Admitting: Cardiovascular Disease

## 2020-01-06 ENCOUNTER — Ambulatory Visit: Payer: Managed Care, Other (non HMO) | Admitting: Cardiovascular Disease

## 2020-01-06 ENCOUNTER — Other Ambulatory Visit: Payer: Self-pay

## 2020-01-06 ENCOUNTER — Encounter: Payer: Self-pay | Admitting: Cardiovascular Disease

## 2020-01-06 VITALS — BP 136/87 | HR 89 | Ht 62.0 in | Wt 182.2 lb

## 2020-01-06 DIAGNOSIS — I10 Essential (primary) hypertension: Secondary | ICD-10-CM | POA: Diagnosis not present

## 2020-01-06 DIAGNOSIS — E785 Hyperlipidemia, unspecified: Secondary | ICD-10-CM

## 2020-01-06 MED ORDER — ROSUVASTATIN CALCIUM 40 MG PO TABS: 40.0000 mg | ORAL_TABLET | Freq: Every day | ORAL | 3 refills | Status: DC

## 2020-01-06 NOTE — Patient Instructions (Addendum)
Medication Instructions:  START ROSUVASTATIN 40 MG DAILY   *If you need a refill on your cardiac medications before your next appointment, please call your pharmacy*  Lab Work: FASTING LP/CMET IN 3 MONTHS FEW DAYS PRIOR TO YOUR FOLLOW UP   If you have labs (blood work) drawn today and your tests are completely normal, you will receive your results only by: [name] MyChart Message (if you have MyChart) OR . A paper copy in the mail If you have any lab test that is abnormal or we need to change your treatment, we will call you to review the results.  Testing/Procedures: NONE   Follow-Up: At [facility], you and your health needs are our priority.  As part of our continuing mission to provide you with exceptional heart care, we have created designated Provider Care Teams.  These Care Teams include your primary Cardiologist (physician) and Advanced Practice Providers (APPs -  Physician Assistants and Nurse Practitioners) who all work together to provide you with the care you need, when you need it.  Your next appointment:   3 month(s)  The format for your next appointment:   In Person  Provider:   You may see DR [facility]  or one of the following Advanced Practice Providers on your designated Care Team:    Corine Shelter, PA-C  [place], [place]  Edd Fabian, [place]   Other Instructions WORK ON EXERCISING WITH A GOAL OF 150 MINUTES EACH WEEK

## 2020-01-06 NOTE — Progress Notes (Signed)
Cardiology Office Note   Date:  01/06/2020   ID:  Susan [name], DOB 1971-02-22, MRN [id number]  PCP:  Dorothyann Peng, MD  Cardiologist:   Chilton Si, MD   No chief complaint on file.    History of Present Illness: Susan [name] is a 49 y.o. female with hypertension, hyperlipidemia, pre-diabetes, and GERD who is being seen today for the evaluation of cardiovascular risk. She reports feeling very stressed lately.  She has several family members with cardiovascular disease and is concerned about her personal risk.  She is a Education officer, community and very active with work, Public affairs consultant, and civic activities.  She is not getting any exercise.  She does not like to exercise much.  She has no chest pain or shortness of breath with every day activities.  She has intermittent mild lower extremity edema that improves with elevation of her legs.  She denies orthopnea or PND.  Lately she has been struggling with sleep.  She tried taking gabapentin but this did not help.  She was started on Lunesta which does seem to help.  She still awakens feeling tired in the morning.  She denies snoring or daytime somnolence.  She was referred for a sleep study which she did at home.  However she does not think it was performed correctly.  She does not really think that she has sleep apnea.  She cooks at home and eats out.  She does not follow any particular diet or limit her salt intake.  She was told that she had hyperlipidemia but did not want to take a statin.  She notes that her teenage daughters have been diagnosed with hyperlipidemia.  Her blood pressure has been pretty well-controlled.  Lately she has been struggling with daily headaches.  This happens 4 to 5 days/week.  In the past she had migraines that were well-controlled with Maxalt but she has not been on it lately.  She struggles with feeling constantly tired and anxious.  She sometimes has palpitations but never with exertion.   Past Medical History:  Diagnosis Date   . Anemia 2016   with bleeding ulcer  . Arthritis    oa hands  . Constipation   . Dizziness   . GERD (gastroesophageal reflux disease)    no current meds  . GI bleeding   . Glaucoma   . Headache    headaches and migraines  . History of bleeding ulcers   . History of stomach ulcers   . Hyperlipidemia    no cholesterol meds taken  . Hypertension   . Hypothyroidism   . Insomnia   . Iron deficiency   . Irregular periods   . Osteoarthritis of both hands   . PCOS (polycystic ovarian syndrome)   . Pre-diabetes   . Syncope   . Vitamin D deficiency     Past Surgical History:  Procedure Laterality Date  . BILATERAL SALPINGECTOMY Bilateral 03/22/2013   Procedure: BILATERAL SALPINGECTOMY;  Surgeon: Serita Kyle, MD;  Location: [facility];  Service: Gynecology;  Laterality: Bilateral;  . CARPAL TUNNEL RELEASE Right 11/15/2013   Procedure: RIGHT CARPAL TUNNEL RELEASE;  Surgeon: Nicki Reaper, MD;  Location: [facility];  Service: Orthopedics;  Laterality: Right;  . COSMETIC SURGERY  01/2015   tummy tuck   . ESOPHAGOGASTRODUODENOSCOPY N/A 10/04/2015   Procedure: ESOPHAGOGASTRODUODENOSCOPY (EGD);  Surgeon: Ruffin Frederick, MD;  Location: [facility];  Service: Gastroenterology;  Laterality: N/A;  . GANGLION CYST EXCISION Right 11/15/2013  Procedure: EXCISION CYST RIGHT WRIST;  Surgeon: Nicki Reaper, MD;  Location: [facility];  Service: Orthopedics;  Laterality: Right;  . LAPAROSCOPIC GASTRIC BAND REMOVAL WITH LAPAROSCOPIC GASTRIC SLEEVE RESECTION N/A 09/23/2017   Procedure: LAPAROSCOPIC GASTRIC BAND REMOVAL WITH LAPAROSCOPIC GASTRIC SLEEVE RESECTION;  Surgeon: Luretha Murphy, MD;  Location: [facility];  Service: General;  Laterality: N/A;  . LAPAROSCOPIC GASTRIC BANDING  2010  . LAPAROSCOPY N/A 03/22/2013   Procedure: LAPAROSCOPY OPERATIVE;  Surgeon: Serita Kyle, MD;  Location: [facility];  Service: Gynecology;  Laterality: N/A;  . MOUTH SURGERY   1991  . SALPINGECTOMY  03/22/2013     Current Outpatient Medications  Medication Sig Dispense Refill  . Eszopiclone (ESZOPICLONE) 3 MG TABS Take 1 tablet (3 mg total) by mouth at bedtime as needed. Take immediately before bedtime 30 tablet 1  . hydrochlorothiazide (MICROZIDE) 12.5 MG capsule Take 1 capsule (12.5 mg total) by mouth daily. 90 capsule 0  . levonorgestrel (MIRENA, 52 MG,) 20 MCG/24HR IUD 1 each by Intrauterine route once. Inserted 2017     . lisinopril (ZESTRIL) 10 MG tablet Take 1 tablet (10 mg total) by mouth daily. 90 tablet 0  . Multiple Vitamin (MULTIVITAMIN) capsule Take by mouth.    . rosuvastatin (CRESTOR) 40 MG tablet Take 1 tablet (40 mg total) by mouth daily. 90 tablet 3   No current facility-administered medications for this visit.    Allergies:   Patient has no known allergies.    Social History:  The patient  reports that she has never smoked. She has never used smokeless tobacco. She reports current alcohol use of about 1.0 standard drinks of alcohol per week. She reports that she does not use drugs.   Family History:  The patient's family history includes CAD in her maternal grandmother; Cancer in her mother; Diabetes in her father, mother, and paternal grandmother; Diabetes gravidarum in her mother; Heart attack in her paternal grandfather; Heart disease in her mother; High blood pressure in her mother; Hypertension in her brother and father; Kidney cancer in her maternal grandmother; Obesity in her mother; Stroke in her mother; Thyroid disease in her father; Ulcers in her father.    ROS:  Please see the history of present illness.   Otherwise, review of systems are positive for none.   All other systems are reviewed and negative.    PHYSICAL EXAM: VS:  BP 136/87   Pulse 89   Ht 5\' 2"  (1.575 m)   Wt 182 lb 3.2 oz (82.6 kg)   LMP  (LMP Unknown)   SpO2 100%   BMI 33.32 kg/m  , BMI Body mass index is 33.32 kg/m. GENERAL:  Well appearing HEENT:  Pupils  equal round and reactive, fundi not visualized, oral mucosa unremarkable NECK:  No jugular venous distention, waveform within normal limits, carotid upstroke brisk and symmetric, no bruits LUNGS:  Clear to auscultation bilaterally HEART:  RRR.  PMI not displaced or sustained,S1 and S2 within normal limits, no S3, no S4, no clicks, no rubs, no murmurs ABD:  Flat, positive bowel sounds normal in frequency in pitch, no bruits, no rebound, no guarding, no midline pulsatile mass, no hepatomegaly, no splenomegaly EXT:  2 plus pulses throughout, no edema, no cyanosis no clubbing SKIN:  No rashes no nodules NEURO:  Cranial nerves II through XII grossly intact, motor grossly intact throughout PSYCH:  Cognitively intact, oriented to person place and time   EKG:  EKG is ordered today. The ekg  ordered today demonstrates sinus rhythm.  Rate 89 bpm.  Low voltage unchanged from prior.     Recent Labs: 03/18/2019: Hemoglobin 14.3; Platelets 316 05/04/2019: TSH 1.520 09/09/2019: ALT 8; BUN 12; Creatinine, Ser 0.96; Potassium 4.2; Sodium 140    Lipid Panel    Component Value Date/Time   CHOL 293 (H) 03/18/2019 1118   TRIG 80 03/18/2019 1118   HDL 51 03/18/2019 1118   CHOLHDL 5.7 (H) 03/18/2019 1118   LDLCALC 226 (H) 03/18/2019 1118      Wt Readings from Last 3 Encounters:  01/06/20 182 lb 3.2 oz (82.6 kg)  12/16/19 176 lb 12.8 oz (80.2 kg)  10/29/19 175 lb 9.6 oz (79.7 kg)      ASSESSMENT AND PLAN:  # Hypertension: Blood pressure slightly above goal.  In general it has been well have been controlled.  She is not getting any exercise and has not been watching her diet.  She is going to work on this.  Discussed trying to increase her exercise to 150 minutes weekly.  She is also going to work on limiting her salt intake.  Continue hydrochlorothiazide and lisinopril at current doses.  Ideally she could get off of at least 1 of these medicines if she works on diet and weight loss.  # Familial  hyperlipidemia: Dr. Jerrol Banana is LDL is 226.  We discussed the fact that this likely means she has familial hyperlipidemia.  She does admit that she could work on her diet quite a bit.  Both of her young daughters have been diagnosed with hyperlipidemia.  She has several family members with cardiovascular disease, though none at a particularly young age.  She also has no history of sudden cardiac death.  We discussed the importance of starting her on a statin to get her LDL down.  She is willing to try rosuvastatin 40 mg daily.  Check lipids and a CMP in 3 months.  # Anxiety: Dr. Sabra Heck seems to be struggling with anxiety.  She has a lot on her plate.  Suggested she work on exercise as above.  Discuss with Dr. Baird Cancer and consider talking with a therapist.     Current medicines are reviewed at length with the patient today.  The patient does not have concerns regarding medicines.  The following changes have been made:  Start rosuvastatin  Labs/ tests ordered today include:   Orders Placed This Encounter  Procedures  . Lipid panel  . Comprehensive metabolic panel  . EKG 12-Lead     Disposition:   FU with Adhya Cocco C. Oval Linsey, MD, [facility] in 3 months.     Signed, Gennaro Lizotte C. Oval Linsey, MD, [facility]  01/06/2020 5:37 PM    [place]

## 2020-01-17 ENCOUNTER — Other Ambulatory Visit: Payer: Self-pay | Admitting: Internal Medicine

## 2020-01-17 DIAGNOSIS — I10 Essential (primary) hypertension: Secondary | ICD-10-CM

## 2020-01-18 LAB — HM MAMMOGRAPHY

## 2020-01-20 ENCOUNTER — Encounter: Payer: Self-pay | Admitting: Internal Medicine

## 2020-01-20 ENCOUNTER — Other Ambulatory Visit: Payer: Self-pay

## 2020-01-23 ENCOUNTER — Other Ambulatory Visit: Payer: Self-pay | Admitting: Internal Medicine

## 2020-02-17 ENCOUNTER — Other Ambulatory Visit: Payer: Self-pay | Admitting: Internal Medicine

## 2020-02-17 NOTE — Telephone Encounter (Signed)
Last 12/16/19 Next 03/23/20

## 2020-03-23 ENCOUNTER — Encounter: Payer: Managed Care, Other (non HMO) | Admitting: Internal Medicine

## 2020-04-03 LAB — COMPREHENSIVE METABOLIC PANEL
ALT: 9 IU/L (ref 0–32)
AST: 14 IU/L (ref 0–40)
Albumin/Globulin Ratio: 1.7 (ref 1.2–2.2)
Albumin: 4.2 g/dL (ref 3.8–4.8)
Alkaline Phosphatase: 67 IU/L (ref 39–117)
BUN/Creatinine Ratio: 9 (ref 9–23)
BUN: 9 mg/dL (ref 6–24)
Bilirubin Total: 1.3 mg/dL — ABNORMAL HIGH (ref 0.0–1.2)
CO2: 22 mmol/L (ref 20–29)
Calcium: 9.2 mg/dL (ref 8.7–10.2)
Chloride: 101 mmol/L (ref 96–106)
Creatinine, Ser: 1.01 mg/dL — ABNORMAL HIGH (ref 0.57–1.00)
GFR calc Af Amer: 77 mL/min/{1.73_m2} (ref >59)
GFR calc non Af Amer: 66 mL/min/{1.73_m2} (ref >59)
Globulin, Total: 2.5 g/dL (ref 1.5–4.5)
Glucose: 89 mg/dL (ref 65–99)
Potassium: 4.1 mmol/L (ref 3.5–5.2)
Sodium: 137 mmol/L (ref 134–144)
Total Protein: 6.7 g/dL (ref 6.0–8.5)

## 2020-04-03 LAB — LIPID PANEL
Chol/HDL Ratio: 6.2 ratio — ABNORMAL HIGH (ref 0.0–4.4)
Cholesterol, Total: 253 mg/dL — ABNORMAL HIGH (ref 100–199)
HDL: 41 mg/dL (ref >39)
LDL Chol Calc (NIH): 193 mg/dL — ABNORMAL HIGH (ref 0–99)
Triglycerides: 107 mg/dL (ref 0–149)
VLDL Cholesterol Cal: 19 mg/dL (ref 5–40)

## 2020-04-04 ENCOUNTER — Encounter: Payer: Self-pay | Admitting: Cardiovascular Disease

## 2020-04-04 ENCOUNTER — Ambulatory Visit: Payer: Managed Care, Other (non HMO) | Admitting: Cardiovascular Disease

## 2020-04-04 ENCOUNTER — Other Ambulatory Visit: Payer: Self-pay

## 2020-04-04 VITALS — BP 126/82 | HR 87 | Ht 62.0 in | Wt 185.2 lb

## 2020-04-04 DIAGNOSIS — I10 Essential (primary) hypertension: Secondary | ICD-10-CM

## 2020-04-04 DIAGNOSIS — Z5181 Encounter for therapeutic drug level monitoring: Secondary | ICD-10-CM

## 2020-04-04 DIAGNOSIS — E785 Hyperlipidemia, unspecified: Secondary | ICD-10-CM | POA: Diagnosis not present

## 2020-04-04 MED ORDER — PRAVASTATIN SODIUM 80 MG PO TABS: 80.0000 mg | ORAL_TABLET | Freq: Every evening | ORAL | 3 refills | Status: DC

## 2020-04-04 NOTE — Progress Notes (Signed)
Cardiology Office Note   Date:  04/04/2020   ID:  Madge Therrien, DOB 09/12/1971, MRN [id number]  PCP:  Dorothyann Peng, MD  Cardiologist:   Chilton Si, MD   No chief complaint on file.    History of Present Illness: Susan [name] is a 49 y.o. female with hypertension, hyperlipidemia, pre-diabetes, and GERD here for follow-up. She was initially seen s 12/2019 for cardiovascular risk assessment.  He has several family members with cardiovascular disease and is concerned about her personal risk.  She is a Education officer, community and very active with work, Public affairs consultant, and civic activities.  She is not getting any exercise.  She does not like to exercise much.  She has no chest pain or shortness of breath with every day activities.  She has intermittent mild lower extremity edema that improves with elevation of her legs.  She denies orthopnea or PND.  Lately she has been struggling with sleep.  She tried taking gabapentin but this did not help.  She was started on Lunesta which does seem to help.  She still awakens feeling tired in the morning.  She denies snoring or daytime somnolence.  She was referred for a sleep study which she did at home.  However she does not think it was performed correctly.  She does not really think that she has sleep apnea.  She cooks at home and eats out.  She does not follow any particular diet or limit her salt intake.  She was told that she had hyperlipidemia but did not want to take a statin.  She notes that her teenage daughters have been diagnosed with hyperlipidemia.  Her blood pressure has been pretty well-controlled.  Lately she has been struggling with daily headaches.  This happens 4 to 5 days/week.  In the past she had migraines that were well-controlled with Maxalt but she has not been on it lately.  She struggles with feeling constantly tired and anxious.  She sometimes has palpitations but never with exertion.  At her last appointment she started rosuvastatin.  She also  plans to start working on diet and exercise.  She got a new watch and started walking yesterday.  She has not made any dietary changes yet.  She decided to stop all her medications and start from scratch.  She noticed that when she had her lab work checked prior to her colonoscopy her blood sugars were elevated.  She was concerned that this was due to the cholesterol medication.  She also had some myalgias.  She stopped taking the rosuvastatin and all her other medicines about 4 to 5 weeks ago.  She notes that her blood pressure has actually been pretty well-controlled since that time.  She initially struggled without her sleep medication but now is back able to sleep again.  She denies any lower extremity edema, orthopnea, or PND.  She has no chest pain and her breathing has been stable.  Past Medical History:  Diagnosis Date  . Anemia 2016   with bleeding ulcer  . Arthritis    oa hands  . Constipation   . Dizziness   . GERD (gastroesophageal reflux disease)    no current meds  . GI bleeding   . Glaucoma   . Headache    headaches and migraines  . History of bleeding ulcers   . History of stomach ulcers   . Hyperlipidemia    no cholesterol meds taken  . Hypertension   . Hypothyroidism   . Insomnia   .  Iron deficiency   . Irregular periods   . Osteoarthritis of both hands   . PCOS (polycystic ovarian syndrome)   . Pre-diabetes   . Syncope   . Vitamin D deficiency     Past Surgical History:  Procedure Laterality Date  . BILATERAL SALPINGECTOMY Bilateral 03/22/2013   Procedure: BILATERAL SALPINGECTOMY;  Surgeon: Marvene Staff, MD;  Location: [facility];  Service: Gynecology;  Laterality: Bilateral;  . CARPAL TUNNEL RELEASE Right 11/15/2013   Procedure: RIGHT CARPAL TUNNEL RELEASE;  Surgeon: Wynonia Sours, MD;  Location: [facility];  Service: Orthopedics;  Laterality: Right;  . COSMETIC SURGERY  01/2015   tummy tuck   . ESOPHAGOGASTRODUODENOSCOPY N/A 10/04/2015    Procedure: ESOPHAGOGASTRODUODENOSCOPY (EGD);  Surgeon: Manus Gunning, MD;  Location: [facility] [place];  Service: Gastroenterology;  Laterality: N/A;  . GANGLION CYST EXCISION Right 11/15/2013   Procedure: EXCISION CYST RIGHT WRIST;  Surgeon: Wynonia Sours, MD;  Location: [facility];  Service: Orthopedics;  Laterality: Right;  . LAPAROSCOPIC GASTRIC BAND REMOVAL WITH LAPAROSCOPIC GASTRIC SLEEVE RESECTION N/A 09/23/2017   Procedure: LAPAROSCOPIC GASTRIC BAND REMOVAL WITH LAPAROSCOPIC GASTRIC SLEEVE RESECTION;  Surgeon: Johnathan Hausen, MD;  Location: [facility];  Service: General;  Laterality: N/A;  . LAPAROSCOPIC GASTRIC BANDING  2010  . LAPAROSCOPY N/A 03/22/2013   Procedure: LAPAROSCOPY OPERATIVE;  Surgeon: Marvene Staff, MD;  Location: [facility];  Service: Gynecology;  Laterality: N/A;  . MOUTH SURGERY  1991  . SALPINGECTOMY  03/22/2013     Current Outpatient Medications  Medication Sig Dispense Refill  . levonorgestrel (MIRENA, 52 MG,) 20 MCG/24HR IUD 1 each by Intrauterine route once. Inserted 2017     . pravastatin (PRAVACHOL) 80 MG tablet Take 1 tablet (80 mg total) by mouth every evening. 90 tablet 3   No current facility-administered medications for this visit.    Allergies:   Crestor [rosuvastatin]    Social History:  The patient  reports that she has never smoked. She has never used smokeless tobacco. She reports current alcohol use of about 1.0 standard drinks of alcohol per week. She reports that she does not use drugs.   Family History:  The patient's family history includes CAD in her maternal grandmother; Cancer in her mother; Diabetes in her father, mother, and paternal grandmother; Diabetes gravidarum in her mother; Heart attack in her paternal grandfather; Heart disease in her mother; High blood pressure in her mother; Hypertension in her brother and father; Kidney cancer in her maternal grandmother; Obesity in her mother; Stroke in her mother; Thyroid  disease in her father; Ulcers in her father.    ROS:  Please see the history of present illness.   Otherwise, review of systems are positive for none.   All other systems are reviewed and negative.    PHYSICAL EXAM: VS:  BP 126/82   Pulse 87   Ht 5\' 2"  (1.575 m)   Wt 185 lb 3.2 oz (84 kg)   SpO2 99%   BMI 33.87 kg/m  , BMI Body mass index is 33.87 kg/m. GENERAL:  Well appearing HEENT:  Pupils equal round and reactive, fundi not visualized, oral mucosa unremarkable NECK:  No jugular venous distention, waveform within normal limits, carotid upstroke brisk and symmetric, no bruits LUNGS:  Clear to auscultation bilaterally HEART:  RRR.  PMI not displaced or sustained,S1 and S2 within normal limits, no S3, no S4, no clicks, no rubs, no murmurs ABD:  Flat, positive bowel sounds normal in  frequency in pitch, no bruits, no rebound, no guarding, no midline pulsatile mass, no hepatomegaly, no splenomegaly EXT:  2 plus pulses throughout, no edema, no cyanosis no clubbing SKIN:  No rashes no nodules NEURO:  Cranial nerves II through XII grossly intact, motor grossly intact throughout PSYCH:  Cognitively intact, oriented to person place and time   EKG:  EKG is not  ordered today. The ekg ordered 01/07/2020 demonstrates sinus rhythm.  Rate 89 bpm.  Low voltage unchanged from prior.     Recent Labs: 05/04/2019: TSH 1.520 04/03/2020: ALT 9; BUN 9; Creatinine, Ser 1.01; Potassium 4.1; Sodium 137    Lipid Panel    Component Value Date/Time   CHOL 253 (H) 04/03/2020 0931   TRIG 107 04/03/2020 0931   HDL 41 04/03/2020 0931   CHOLHDL 6.2 (H) 04/03/2020 0931   LDLCALC 193 (H) 04/03/2020 0931      Wt Readings from Last 3 Encounters:  04/04/20 185 lb 3.2 oz (84 kg)  01/06/20 182 lb 3.2 oz (82.6 kg)  12/16/19 176 lb 12.8 oz (80.2 kg)      ASSESSMENT AND PLAN:  # Hypertension: Dr. Martie Round stopped taking her hydrochlorothiazide and lisinopril.  Her blood pressure is actually only very  minimally elevated today.  She is committed to working on exercise and diet.  She will continue to monitor her blood pressure at home..  # Familial hyperlipidemia: Dr. Jim Like is LDL was 226.  We discussed the fact that this likely means she has familial hyperlipidemia.  She started taking rosuvastatin but had myalgias.  She had lab work earlier this week that showed an improvement in her LDL to 193.  However she has not been taking it for a month.  She is going to start pravastatin, which is also less likely to affect her blood glucose.  Repeat lipids and a CMP in 6 to 8 weeks.  If she is unable to tolerate it or her lipids are not at goal then we will need to start a PCSK9 inhibitor.     Current medicines are reviewed at length with the patient today.  The patient does not have concerns regarding medicines.  The following changes have been made:  Start pravastatin  Labs/ tests ordered today include:   Orders Placed This Encounter  Procedures  . Lipid panel  . Comprehensive metabolic panel     Disposition:   FU with Susan [name] C. Duke Salvia, MD, [facility] in 3 months.     Signed, Starleen Trussell C. Duke Salvia, MD, [facility]  04/04/2020 4:50 PM    [facility]

## 2020-04-04 NOTE — Patient Instructions (Signed)
Medication Instructions:  START PRAVASTATIN 80 MG DAILY   *If you need a refill on your cardiac medications before your next appointment, please call your pharmacy*  Lab Work: FASTING LP/CMET IN 6-8 WEEKS  If you have labs (blood work) drawn today and your tests are completely normal, you will receive your results only by: [name] MyChart Message (if you have MyChart) OR . A paper copy in the mail If you have any lab test that is abnormal or we need to change your treatment, we will call you to review the results.  Testing/Procedures: NONE  Follow-Up: At [facility], you and your health needs are our priority.  As part of our continuing mission to provide you with exceptional heart care, we have created designated Provider Care Teams.  These Care Teams include your primary Cardiologist (physician) and Advanced Practice Providers (APPs -  Physician Assistants and Nurse Practitioners) who all work together to provide you with the care you need, when you need it.  We recommend signing up for the patient portal called "MyChart".  Sign up information is provided on this After Visit Summary.  MyChart is used to connect with patients for Virtual Visits (Telemedicine).  Patients are able to view lab/test results, encounter notes, upcoming appointments, etc.  Non-urgent messages can be sent to your provider as well.   To learn more about what you can do with MyChart, go to [email or web address].    Your next appointment:   3 month(s)  The format for your next appointment:   In Person  Provider:   You may see DR [facility]  or one of the following Advanced Practice Providers on your designated Care Team:    Corine Shelter, PA-C  [place], [place]  Edd Fabian, [place]   Other Instructions WORK ON DIET AND EXERCISE

## 2020-04-14 LAB — HM COLONOSCOPY

## 2020-04-17 ENCOUNTER — Encounter: Payer: Self-pay | Admitting: Internal Medicine

## 2020-05-10 ENCOUNTER — Other Ambulatory Visit: Payer: Self-pay

## 2020-05-10 MED ORDER — LISINOPRIL 10 MG PO TABS: 10.0000 mg | ORAL_TABLET | Freq: Every day | ORAL | 1 refills | Status: DC

## 2020-05-25 ENCOUNTER — Encounter: Payer: Managed Care, Other (non HMO) | Admitting: Internal Medicine

## 2020-06-26 ENCOUNTER — Ambulatory Visit (HOSPITAL_COMMUNITY)
Admission: EM | Admit: 2020-06-26 | Discharge: 2020-06-26 | Disposition: A | Discharge status: Home / Self Care | Payer: Managed Care, Other (non HMO) | Attending: Emergency Medicine | Admitting: Emergency Medicine

## 2020-06-26 ENCOUNTER — Encounter (HOSPITAL_COMMUNITY): Payer: Self-pay | Admitting: Emergency Medicine

## 2020-06-26 ENCOUNTER — Other Ambulatory Visit: Payer: Self-pay

## 2020-06-26 DIAGNOSIS — R519 Headache, unspecified: Secondary | ICD-10-CM

## 2020-06-26 DIAGNOSIS — I10 Essential (primary) hypertension: Secondary | ICD-10-CM | POA: Diagnosis not present

## 2020-06-26 MED ORDER — HYDROCHLOROTHIAZIDE 12.5 MG PO TABS
12.5000 mg | ORAL_TABLET | Freq: Every day | ORAL | 0 refills | Status: DC
Start: 2020-06-26 — End: 2022-09-30

## 2020-06-26 MED ORDER — DEXAMETHASONE SODIUM PHOSPHATE 10 MG/ML IJ SOLN
10.0000 mg | Freq: Once | INTRAMUSCULAR | Status: AC
  Administered 2020-06-26: 10 mg via INTRAMUSCULAR

## 2020-06-26 MED ORDER — NAPROXEN 500 MG PO TABS
500.0000 mg | ORAL_TABLET | Freq: Two times a day (BID) | ORAL | 0 refills | Status: DC
Start: 2020-06-26 — End: 2021-02-08

## 2020-06-26 MED ORDER — KETOROLAC TROMETHAMINE 30 MG/ML IJ SOLN
30.0000 mg | Freq: Once | INTRAMUSCULAR | Status: AC
  Administered 2020-06-26: 30 mg via INTRAMUSCULAR

## 2020-06-26 MED ORDER — METOCLOPRAMIDE HCL 5 MG/ML IJ SOLN
5.0000 mg | Freq: Once | INTRAMUSCULAR | Status: AC
  Administered 2020-06-26: 5 mg via INTRAMUSCULAR

## 2020-06-26 MED ORDER — METOCLOPRAMIDE HCL 5 MG/ML IJ SOLN
INTRAMUSCULAR | Status: AC
  Filled 2020-06-26: qty 2

## 2020-06-26 MED ORDER — KETOROLAC TROMETHAMINE 30 MG/ML IJ SOLN
INTRAMUSCULAR | Status: AC
  Filled 2020-06-26: qty 1

## 2020-06-26 MED ORDER — DEXAMETHASONE SODIUM PHOSPHATE 10 MG/ML IJ SOLN
INTRAMUSCULAR | Status: AC
  Filled 2020-06-26: qty 1

## 2020-06-26 MED ORDER — LISINOPRIL 10 MG PO TABS: 10.0000 mg | ORAL_TABLET | Freq: Every day | ORAL | 1 refills | Status: DC

## 2020-06-26 NOTE — ED Triage Notes (Signed)
Pt sts frontal HA x 2 days; pt sts stopped taking htn meds x several months ago; pt also sts some nausea

## 2020-06-26 NOTE — Discharge Instructions (Signed)
We gave you toradol, decardon and reglan for your headache, should begin to ease pain in about 45 min May use naprosyn twice daily with food as needed for further headache relief  Your blood pressure was elevated today in clinic. Please be sure to take blood pressure medications as prescribed. Please monitor your blood pressure at home or when you go to a [facility]. Please follow up with your primary care doctor to recheck blood pressure and discuss any need for medication changes.   Please go to Emergency Room if you start to experience severe headache, vision changes, decreased urine production, chest pain, shortness of breath, speech slurring, one sided weakness.

## 2020-06-26 NOTE — ED Provider Notes (Signed)
[facility]    CSN: [id number] Arrival date & time: 06/26/20  1102      History   Chief Complaint Chief Complaint  Patient presents with  . Headache    HPI Susan [name] is a 49 y.o. female history of hypertension, PCOS, GERD, presenting today for evaluation of headaches and elevated blood pressure. Patient reports that she has been off of her blood pressure medicine for a few months. Of recently she has noticed more frequent headaches. Over the past 2 days she has had a persistent headache which has not been relieved with drinking water as well as taking BC powder. She did find some old lisinopril's which she did take. Notes that her blood pressure was 178/113 yesterday. Notes similar symptoms previously when off of blood pressure medicine. Headache is a dull aching sensation located behind eyes. Has associated nausea. Denies photophobia/phonophobia. Denies any vision changes, difficulty speaking, facial drooping or weakness. Denies chest pain or shortness of breath. Denies dizziness or lightheadedness.  HPI  Past Medical History:  Diagnosis Date  . Anemia 2016   with bleeding ulcer  . Arthritis    oa hands  . Constipation   . Dizziness   . GERD (gastroesophageal reflux disease)    no current meds  . GI bleeding   . Glaucoma   . Headache    headaches and migraines  . History of bleeding ulcers   . History of stomach ulcers   . Hyperlipidemia    no cholesterol meds taken  . Hypertension   . Hypothyroidism   . Insomnia   . Iron deficiency   . Irregular periods   . Osteoarthritis of both hands   . PCOS (polycystic ovarian syndrome)   . Pre-diabetes   . Syncope   . Vitamin D deficiency     Patient Active Problem List   Diagnosis Date Noted  . 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 Cervical spondylosis without myelopathy 12/18/2016  . Cubital tunnel syndrome 12/02/2016  . Upper GI bleed 10/03/2015  . Syncope 10/03/2015  . Acute  blood loss anemia 10/03/2015  . Hypotension 10/03/2015  . Acute upper GI bleed 10/03/2015  . Familial hyperlipidemia   . Hypertension   . Encounter for consultation 08/08/2014  . Lapband APS April 2010 [facility] Ad[facility] 08/07/2012    Past Surgical History:  Procedure Laterality Date  . BILATERAL SALPINGECTOMY Bilateral 03/22/2013   Procedure: BILATERAL SALPINGECTOMY;  Surgeon: Serita KyleSheronette A Cousins, MD;  Location: [facility];  Service: Gynecology;  Laterality: Bilateral;  . CARPAL TUNNEL RELEASE Right 11/15/2013   Procedure: RIGHT CARPAL TUNNEL RELEASE;  Surgeon: Nicki ReaperGary R Kuzma, MD;  Location: [facility];  Service: Orthopedics;  Laterality: Right;  . COSMETIC SURGERY  01/2015   tummy tuck   . ESOPHAGOGASTRODUODENOSCOPY N/A 10/04/2015   Procedure: ESOPHAGOGASTRODUODENOSCOPY (EGD);  Surgeon: Ruffin FrederickSteven Paul Armbruster, MD;  [facility];  Service: Gastroenterology;  Laterality: N/A;  . GANGLION CYST EXCISION Right 11/15/2013   Procedure: EXCISION CYST RIGHT WRIST;  Surgeon: Nicki ReaperGary R Kuzma, MD;  Location: [facility];  Service: Orthopedics;  Laterality: Right;  . LAPAROSCOPIC GASTRIC BAND REMOVAL WITH LAPAROSCOPIC GASTRIC SLEEVE RESECTION N/A 09/23/2017   Procedure: LAPAROSCOPIC GASTRIC BAND REMOVAL WITH LAPAROSCOPIC GASTRIC SLEEVE RESECTION;  Surgeon: Luretha MurphyMartin, Matthew, MD;  Location: [facility];  Service: General;  Laterality: N/A;  . LAPAROSCOPIC GASTRIC BANDING  2010  . LAPAROSCOPY N/A 03/22/2013   Procedure: LAPAROSCOPY OPERATIVE;  Surgeon: Nena JordanSheronette A  Cherly Hensen, MD;  Location: [facility];  Service: Gynecology;  Laterality: N/A;  . MOUTH SURGERY  1991  . SALPINGECTOMY  03/22/2013    OB History    Gravida  2   Para  2   Term      Preterm      AB      Living        SAB      TAB      Ectopic      Multiple      Live Births               Home Medications    Prior to Admission medications   Medication Sig Start Date End Date Taking?  Authorizing Provider  hydrochlorothiazide (HYDRODIURIL) 12.5 MG tablet Take 1 tablet (12.5 mg total) by mouth daily. 06/26/20   Saber Dickerman C, PA-C  levonorgestrel (MIRENA, 52 MG,) 20 MCG/24HR IUD 1 each by Intrauterine route once. Inserted 2017     [provider]  lisinopril (ZESTRIL) 10 MG tablet Take 1 tablet (10 mg total) by mouth daily. 06/26/20   Ara Mano C, PA-C  naproxen (NAPROSYN) 500 MG tablet Take 1 tablet (500 mg total) by mouth 2 (two) times daily. 06/26/20   Keane Martelli C, PA-C  pravastatin (PRAVACHOL) 80 MG tablet Take 1 tablet (80 mg total) by mouth every evening. Patient not taking: Reported on 06/26/2020 04/04/20 07/03/20  Chilton Si, MD    Family History Family History  Problem Relation Age of Onset  . Cancer Mother        pancreatic  . Stroke Mother   . Diabetes Mother   . Diabetes gravidarum Mother   . High blood pressure Mother   . Heart disease Mother   . Obesity Mother   . Hypertension Father   . Ulcers Father   . Diabetes Father   . Thyroid disease Father   . Hypertension Brother   . CAD Maternal Grandmother   . Kidney cancer Maternal Grandmother   . Diabetes Paternal Grandmother   . Heart attack Paternal Grandfather     Social History Social History   Tobacco Use  . Smoking status: Never Smoker  . Smokeless tobacco: Never Used  Vaping Use  . Vaping Use: Never used  Substance Use Topics  . Alcohol use: Yes    Alcohol/week: 1.0 standard drink    Types: 1 Standard drinks or equivalent per week    Comment: socially  . Drug use: No     Allergies   Crestor [rosuvastatin]   Review of Systems Review of Systems  Constitutional: Negative for fatigue and fever.  HENT: Negative for congestion, sinus pressure and sore throat.   Eyes: Negative for photophobia, pain and visual disturbance.  Respiratory: Negative for cough and shortness of breath.   Cardiovascular: Negative for chest pain.  Gastrointestinal: Positive for  nausea. Negative for abdominal pain and vomiting.  Genitourinary: Negative for decreased urine volume and hematuria.  Musculoskeletal: Negative for myalgias, neck pain and neck stiffness.  Neurological: Positive for headaches. Negative for dizziness, syncope, facial asymmetry, speech difficulty, weakness, light-headedness and numbness.     Physical Exam Triage Vital Signs ED Triage Vitals [06/26/20 1215]  Enc Vitals Group     BP (!) 158/99     Pulse Rate 70     Resp 18     Temp 98.7 F (37.1 C)     Temp Source Oral     SpO2 100 %  Weight      Height      Head Circumference      Peak Flow      Pain Score 9     Pain Loc      Pain Edu?      Excl. in GC?    No data found.  Updated Vital Signs BP (!) 158/99 (BP Location: Right Arm)   Pulse 70   Temp 98.7 F (37.1 C) (Oral)   Resp 18   SpO2 100%   Visual Acuity Right Eye Distance:   Left Eye Distance:   Bilateral Distance:    Right Eye Near:   Left Eye Near:    Bilateral Near:     Physical Exam Vitals and nursing note reviewed.  Constitutional:      Appearance: She is well-developed.     Comments: No acute distress  HENT:     Head: Normocephalic and atraumatic.     Ears:     Comments: Bilateral ears without tenderness to palpation of external auricle, tragus and mastoid, EAC's without erythema or swelling, TM's with good bony landmarks and cone of light. Non erythematous.     Nose: Nose normal.     Mouth/Throat:     Comments: Oral mucosa pink and moist, no tonsillar enlargement or exudate. Posterior pharynx patent and nonerythematous, no uvula deviation or swelling. Normal phonation. Eyes:     Extraocular Movements: Extraocular movements intact.     Conjunctiva/sclera: Conjunctivae normal.     Pupils: Pupils are equal, round, and reactive to light.  Cardiovascular:     Rate and Rhythm: Normal rate.  Pulmonary:     Effort: Pulmonary effort is normal. No respiratory distress.     Comments: Breathing  comfortably at rest, CTABL, no wheezing, rales or other adventitious sounds auscultated Abdominal:     General: There is no distension.  Musculoskeletal:        General: Normal range of motion.     Cervical back: Neck supple.  Skin:    General: Skin is warm and dry.  Neurological:     General: No focal deficit present.     Mental Status: She is alert and oriented to person, place, and time.     Comments: Patient A&O x3, cranial nerves II-XII grossly intact, strength at shoulders, hips and knees 5/5, equal bilaterally, patellar reflex 1+ bilaterally.  Negative Romberg . Gait without abnormality.      UC Treatments / Results  Labs (all labs ordered are listed, but only abnormal results are displayed) Labs Reviewed - No data to display  EKG   Radiology No results found.  Procedures Procedures (including critical care time)  Medications Ordered in UC Medications  ketorolac (TORADOL) 30 MG/ML injection 30 mg (has no administration in time range)  metoCLOPramide (REGLAN) injection 5 mg (has no administration in time range)  dexamethasone (DECADRON) injection 10 mg (has no administration in time range)    Initial Impression / Assessment and Plan / UC Course  I have reviewed the triage vital signs and the nursing notes.  Pertinent labs & imaging results that were available during my care of the patient were reviewed by me and considered in my medical decision making (see chart for details).     1. Hypertension-we'll refill lisinopril and HCTZ to restart taking as prescribed. Last CMP in May, 2 months ago with normal creatinine and electrolytes. Will defer further blood work today and have follow-up with PCP for further monitoring and management of blood  pressure.  2. Headache-migraine versus secondary to elevated blood pressure. Restart blood pressure medicines and monitor for improvement in frequency of headaches. Providing migraine cocktail and Naprosyn for use of headache  management at home. No red flags. No neuro deficits.  Discussed strict return precautions. Patient verbalized understanding and is agreeable with plan.  Final Clinical Impressions(s) / UC Diagnoses   Final diagnoses:  Essential hypertension  Acute nonintractable headache, unspecified headache type     Discharge Instructions     We gave you toradol, decardon and reglan for your headache, should begin to ease pain in about 45 min May use naprosyn twice daily with food as needed for further headache relief  Your blood pressure was elevated today in clinic. Please be sure to take blood pressure medications as prescribed. Please monitor your blood pressure at home or when you go to a [facility]. Please follow up with your primary care doctor to recheck blood pressure and discuss any need for medication changes.   Please go to Emergency Room if you start to experience severe headache, vision changes, decreased urine production, chest pain, shortness of breath, speech slurring, one sided weakness.    ED Prescriptions    Medication Sig Dispense Auth. Provider   lisinopril (ZESTRIL) 10 MG tablet Take 1 tablet (10 mg total) by mouth daily. 90 tablet Semya Klinke C, PA-C   hydrochlorothiazide (HYDRODIURIL) 12.5 MG tablet Take 1 tablet (12.5 mg total) by mouth daily. 90 tablet Lucerito Rosinski C, PA-C   naproxen (NAPROSYN) 500 MG tablet Take 1 tablet (500 mg total) by mouth 2 (two) times daily. 30 tablet Earlene Bjelland, [place] C, PA-C     PDMP not reviewed this encounter.   Annitta Fifield, [place] C, PA-C 06/26/20 1320

## 2020-07-11 ENCOUNTER — Ambulatory Visit: Payer: Managed Care, Other (non HMO) | Admitting: Cardiovascular Disease

## 2020-08-17 LAB — COMPREHENSIVE METABOLIC PANEL
ALT: 9 IU/L (ref 0–32)
AST: 15 IU/L (ref 0–40)
Albumin/Globulin Ratio: 1.6 (ref 1.2–2.2)
Albumin: 4.2 g/dL (ref 3.8–4.8)
Alkaline Phosphatase: 71 IU/L (ref 44–121)
BUN/Creatinine Ratio: 9 (ref 9–23)
BUN: 9 mg/dL (ref 6–24)
Bilirubin Total: 0.9 mg/dL (ref 0.0–1.2)
CO2: 28 mmol/L (ref 20–29)
Calcium: 9.3 mg/dL (ref 8.7–10.2)
Chloride: 100 mmol/L (ref 96–106)
Creatinine, Ser: 1.03 mg/dL — ABNORMAL HIGH (ref 0.57–1.00)
GFR calc Af Amer: 74 mL/min/{1.73_m2} (ref >59)
GFR calc non Af Amer: 64 mL/min/{1.73_m2} (ref >59)
Globulin, Total: 2.7 g/dL (ref 1.5–4.5)
Glucose: 87 mg/dL (ref 65–99)
Potassium: 4.3 mmol/L (ref 3.5–5.2)
Sodium: 141 mmol/L (ref 134–144)
Total Protein: 6.9 g/dL (ref 6.0–8.5)

## 2020-08-17 LAB — LIPID PANEL
Chol/HDL Ratio: 5.5 ratio — ABNORMAL HIGH (ref 0.0–4.4)
Cholesterol, Total: 248 mg/dL — ABNORMAL HIGH (ref 100–199)
HDL: 45 mg/dL (ref >39)
LDL Chol Calc (NIH): 187 mg/dL — ABNORMAL HIGH (ref 0–99)
Triglycerides: 92 mg/dL (ref 0–149)
VLDL Cholesterol Cal: 16 mg/dL (ref 5–40)

## 2020-08-21 ENCOUNTER — Ambulatory Visit (INDEPENDENT_AMBULATORY_CARE_PROVIDER_SITE_OTHER): Payer: Self-pay | Admitting: Cardiovascular Disease

## 2020-08-21 ENCOUNTER — Encounter: Payer: Self-pay | Admitting: Cardiovascular Disease

## 2020-08-21 ENCOUNTER — Other Ambulatory Visit: Payer: Self-pay

## 2020-08-21 VITALS — BP 112/78 | HR 78 | Ht 62.0 in | Wt 184.0 lb

## 2020-08-21 DIAGNOSIS — I10 Essential (primary) hypertension: Secondary | ICD-10-CM

## 2020-08-21 DIAGNOSIS — E785 Hyperlipidemia, unspecified: Secondary | ICD-10-CM

## 2020-08-21 DIAGNOSIS — E7849 Other hyperlipidemia: Secondary | ICD-10-CM

## 2020-08-21 NOTE — Patient Instructions (Signed)
Medication Instructions:  Your physician recommends that you continue on your current medications as directed. Please refer to the Current Medication list given to you today.  *If you need a refill on your cardiac medications before your next appointment, please call your pharmacy*  Lab Work: NONE  Testing/Procedures: NONE  Follow-Up: At [facility], you and your health needs are our priority.  As part of our continuing mission to provide you with exceptional heart care, we have created designated Provider Care Teams.  These Care Teams include your primary Cardiologist (physician) and Advanced Practice Providers (APPs -  Physician Assistants and Nurse Practitioners) who all work together to provide you with the care you need, when you need it.  We recommend signing up for the patient portal called "MyChart".  Sign up information is provided on this After Visit Summary.  MyChart is used to connect with patients for Virtual Visits (Telemedicine).  Patients are able to view lab/test results, encounter notes, upcoming appointments, etc.  Non-urgent messages can be sent to your provider as well.   To learn more about what you can do with MyChart, go to [email or web address].    Your next appointment:   6 month(s)  The format for your next appointment:   In Person  Provider:   You may see DR [facility] or one of the following Advanced Practice Providers on your designated Care Team:    Corine Shelter, PA-C  [place], [place]  Edd Fabian, [place]  Your physician recommends that you schedule a follow-up appointment in: PHARM D TO DISCUSS CHOLESTEROL

## 2020-08-21 NOTE — Progress Notes (Signed)
Cardiology Office Note   Date:  08/21/2020   ID:  Susan [name], DOB 06/14/1971, MRN [id number]  PCP:  Dorothyann Peng, MD  Cardiologist:   Chilton Si, MD   No chief complaint on file.    History of Present Illness: Susan [name] is a 49 y.o. female with hypertension, hyperlipidemia, pre-diabetes, PCOS, and GERD here for follow-up. She was initially seen s 12/2019 for cardiovascular risk assessment.  He has several family members with cardiovascular disease and is concerned about her personal risk.  She is a Education officer, community and very active with work, Public affairs consultant, and civic activities.  She is not getting any exercise.  She does not like to exercise much.  She has no chest pain or shortness of breath with every day activities.  She has intermittent mild lower extremity edema that improves with elevation of her legs.  She denies orthopnea or PND.  Lately she has been struggling with sleep.  She tried taking gabapentin but this did not help.  She was started on Lunesta which does seem to help.  She still awakens feeling tired in the morning.  She denies snoring or daytime somnolence.  She was referred for a sleep study which she did at home.  However she does not think it was performed correctly.  She does not really think that she has sleep apnea.  She cooks at home and eats out.  She does not follow any particular diet or limit her salt intake.  She was told that she had hyperlipidemia but did not want to take a statin.  She notes that her teenage daughters have been diagnosed with hyperlipidemia.  Her blood pressure has been pretty well-controlled.  Lately she has been struggling with daily headaches.  This happens 4 to 5 days/week.  In the past she had migraines that were well-controlled with Maxalt but she has not been on it lately.    Dr. Jim Like was started rosuvastatin.  However this was stopped and switched to pravastatin due to myalgias.  She has been taking the pravastatin but lipids remain  poorly controlled. Since her last appointment she had stopped taking all antihypertensives and statin.  She was checking her blood pressures and they were controlled.  Her blood pressure in the office that it was 126/82.  She was committed to working on diet and exercise.  She has been going to [facility] 3 days/week and exercising for 1 hour.  She feels good with exercise and has no exertional chest pain or shortness of breath, though she still does not like to do it.  However she was seen in the ED 06/2020 with 2 days of headaches and blood pressures in the 170s over 110s.  Lisinopril and hydrochlorothiazide were resumed at that time.  Since restarting the blood pressure medication her blood pressure has been much better controlled. She took the month of September off and starts a new job next month.    Past Medical History:  Diagnosis Date  . Anemia 2016   with bleeding ulcer  . Arthritis    oa hands  . Constipation   . Dizziness   . GERD (gastroesophageal reflux disease)    no current meds  . GI bleeding   . Glaucoma   . Headache    headaches and migraines  . History of bleeding ulcers   . History of stomach ulcers   . Hyperlipidemia    no cholesterol meds taken  . Hypertension   . Hypothyroidism   .  Insomnia   . Iron deficiency   . Irregular periods   . Osteoarthritis of both hands   . PCOS (polycystic ovarian syndrome)   . Pre-diabetes   . Syncope   . Vitamin D deficiency     Past Surgical History:  Procedure Laterality Date  . BILATERAL SALPINGECTOMY Bilateral 03/22/2013   Procedure: BILATERAL SALPINGECTOMY;  Surgeon: Serita Kyle, MD;  Location: [facility];  Service: Gynecology;  Laterality: Bilateral;  . CARPAL TUNNEL RELEASE Right 11/15/2013   Procedure: RIGHT CARPAL TUNNEL RELEASE;  Surgeon: Nicki Reaper, MD;  Location: [facility];  Service: Orthopedics;  Laterality: Right;  . COSMETIC SURGERY  01/2015   tummy tuck   . ESOPHAGOGASTRODUODENOSCOPY N/A  10/04/2015   Procedure: ESOPHAGOGASTRODUODENOSCOPY (EGD);  Surgeon: Ruffin Frederick, MD;  Location: [facility];  Service: Gastroenterology;  Laterality: N/A;  . GANGLION CYST EXCISION Right 11/15/2013   Procedure: EXCISION CYST RIGHT WRIST;  Surgeon: Nicki Reaper, MD;  Location: [facility];  Service: Orthopedics;  Laterality: Right;  . LAPAROSCOPIC GASTRIC BAND REMOVAL WITH LAPAROSCOPIC GASTRIC SLEEVE RESECTION N/A 09/23/2017   Procedure: LAPAROSCOPIC GASTRIC BAND REMOVAL WITH LAPAROSCOPIC GASTRIC SLEEVE RESECTION;  Surgeon: Luretha Murphy, MD;  Location: [facility];  Service: General;  Laterality: N/A;  . LAPAROSCOPIC GASTRIC BANDING  2010  . LAPAROSCOPY N/A 03/22/2013   Procedure: LAPAROSCOPY OPERATIVE;  Surgeon: Serita Kyle, MD;  Location: [facility];  Service: Gynecology;  Laterality: N/A;  . MOUTH SURGERY  1991  . SALPINGECTOMY  03/22/2013     Current Outpatient Medications  Medication Sig Dispense Refill  . hydrochlorothiazide (HYDRODIURIL) 12.5 MG tablet Take 1 tablet (12.5 mg total) by mouth daily. 90 tablet 0  . levonorgestrel (MIRENA, 52 MG,) 20 MCG/24HR IUD 1 each by Intrauterine route once. Inserted 2017     . lisinopril (ZESTRIL) 10 MG tablet Take 1 tablet (10 mg total) by mouth daily. 90 tablet 1  . naproxen (NAPROSYN) 500 MG tablet Take 1 tablet (500 mg total) by mouth 2 (two) times daily. 30 tablet 0  . pravastatin (PRAVACHOL) 80 MG tablet Take 1 tablet (80 mg total) by mouth every evening. (Patient not taking: Reported on 06/26/2020) 90 tablet 3   No current facility-administered medications for this visit.    Allergies:   Crestor [rosuvastatin]    Social History:  The patient  reports that she has never smoked. She has never used smokeless tobacco. She reports current alcohol use of about 1.0 standard drink of alcohol per week. She reports that she does not use drugs.   Family History:  The patient's family history includes CAD in her maternal  grandmother; Cancer in her mother; Diabetes in her father, mother, and paternal grandmother; Diabetes gravidarum in her mother; Heart attack in her paternal grandfather; Heart disease in her mother; High blood pressure in her mother; Hypertension in her brother and father; Kidney cancer in her maternal grandmother; Obesity in her mother; Stroke in her mother; Thyroid disease in her father; Ulcers in her father.    ROS:  Please see the history of present illness.   Otherwise, review of systems are positive for none.   All other systems are reviewed and negative.    PHYSICAL EXAM: VS:  BP 112/78   Pulse 78   Ht 5\' 2"  (1.575 m)   Wt 184 lb (83.5 kg)   SpO2 97%   BMI 33.65 kg/m  , BMI Body mass index is 33.65 kg/m. GENERAL:  Well appearing  HEENT: Pupils equal round and reactive, fundi not visualized, oral mucosa unremarkable NECK:  No jugular venous distention, waveform within normal limits, carotid upstroke brisk and symmetric, no bruits LUNGS:  Clear to auscultation bilaterally HEART:  RRR.  PMI not displaced or sustained,S1 and S2 within normal limits, no S3, no S4, no clicks, no rubs, no murmurs ABD:  Flat, positive bowel sounds normal in frequency in pitch, no bruits, no rebound, no guarding, no midline pulsatile mass, no hepatomegaly, no splenomegaly EXT:  2 plus pulses throughout, no edema, no cyanosis no clubbing SKIN:  No rashes no nodules NEURO:  Cranial nerves II through XII grossly intact, motor grossly intact throughout PSYCH:  Cognitively intact, oriented to person place and time    EKG:  EKG is not ordered today. The ekg ordered 01/07/2020 demonstrates sinus rhythm.  Rate 89 bpm.  Low voltage unchanged from prior.     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  Her blood pressure then became very elevated.  She is back on the lisinopril and hydrochlorothiazide and doing well.  She is also exercising regularly.  She is congratulated for these efforts.  She is on work on diet next but cannot focus on this just yet.  # Familial hyperlipidemia: Dr. Enrique Sack LDL was 226.  She did not tolerate rosuvastatin and lipids are not controlled on pravastatin.  We will have her see our pharmacist to work on getting her a PCSK9 inhibitor for her likely familial hyperlipidemia.  Also recommended that she have her daughters' cholesterol checked.    Current medicines are reviewed at length with the patient today.  The patient does not have concerns regarding medicines.  The following changes have been made:  PCSK9 inhibitor Labs/ tests ordered today include:   Orders Placed This Encounter  Procedures  . AMB Referral to [facility] Pharm[facility]D     Disposition:   FU with Letishia Elliott C. Duke Salvia, MD, [facility] in 6 months.     Signed, Meshilem Machuca C. Duke Salvia, MD, [facility]  08/21/2020 9:54 AM    [facility]

## 2020-08-25 ENCOUNTER — Ambulatory Visit (INDEPENDENT_AMBULATORY_CARE_PROVIDER_SITE_OTHER): Payer: Self-pay | Admitting: Pharmacist Clinician (PhC)/ Clinical Pharmacy Specialist

## 2020-08-25 ENCOUNTER — Other Ambulatory Visit: Payer: Self-pay

## 2020-08-25 DIAGNOSIS — E7849 Other hyperlipidemia: Secondary | ICD-10-CM

## 2020-08-25 NOTE — Progress Notes (Signed)
08/25/2020 Susan [name] 01-25-1971 [id number]   HPI:  Susan [name] is a 49 y.o. female patient of Dr Duke Salvia, who presents today for a lipid clinic evaluation.  Patient has history of familial hyperlipidemia, with baseline untreated LDL levels > 190.   Her medical history is also significant for hypertension, pre-diabetes and history of sleeve gastrectomy in 2018.    Patient was started on rosuvastatin for her elevated LDL levels, but this was later switched to pravastatin due to myalgias.  Her LDL levels are still quite elevated on this, despite being at the 80 mg dose (dropped only to 187).  She has since discontinued this.    Current Medications: none  Cholesterol Goals: LDL < 100   Intolerant/previously tried: rosuvastatin caused myalgias, pravastatin was ineffective  Family history: inherited from father, he had elevated cholesterol though no ASCVD; pgf died from MI; mother with stroke, died at 67; one brother, he has normal cholesterol levels; 2 children (teenagers)  Diet: mostly home cooked foods  - mostly seafood and chicken; working on increasing vegetable intake, prefers fresh; admits to snacking on chips; drinks are mostly water and juices, no soda  Exercise:  30 min cardio/30 min weights 3 times per week;   Labs: 9/21: TC 248, TG 92, HDL 45, LDL 187 (on pravastatin 80 mg)  Baseline LDL 02/2019: 226   Current Outpatient Medications  Medication Sig Dispense Refill   hydrochlorothiazide (HYDRODIURIL) 12.5 MG tablet Take 1 tablet (12.5 mg total) by mouth daily. 90 tablet 0   levonorgestrel (MIRENA, 52 MG,) 20 MCG/24HR IUD 1 each by Intrauterine route once. Inserted 2017      lisinopril (ZESTRIL) 10 MG tablet Take 1 tablet (10 mg total) by mouth daily. 90 tablet 1   naproxen (NAPROSYN) 500 MG tablet Take 1 tablet (500 mg total) by mouth 2 (two) times daily. 30 tablet 0   pravastatin (PRAVACHOL) 80 MG tablet Take 1 tablet (80 mg total) by mouth every evening. (Patient  not taking: Reported on 06/26/2020) 90 tablet 3   No current facility-administered medications for this visit.    Allergies  Allergen Reactions   Crestor [Rosuvastatin]     Myalgias     Past Medical History:  Diagnosis Date   Anemia 2016   with bleeding ulcer   Arthritis    oa hands   Constipation    Dizziness    GERD (gastroesophageal reflux disease)    no current meds   GI bleeding    Glaucoma    Headache    headaches and migraines   History of bleeding ulcers    History of stomach ulcers    Hyperlipidemia    no cholesterol meds taken   Hypertension    Hypothyroidism    Insomnia    Iron deficiency    Irregular periods    Osteoarthritis of both hands    PCOS (polycystic ovarian syndrome)    Pre-diabetes    Syncope    Vitamin D deficiency     There were no vitals taken for this visit.   Familial hyperlipidemia Patient with familial hyperlipidemia, LDL currently at 187.  Baseline in 2020 was LDL 226.  Reviewed options for lowering LDL cholesterol, including ezetimibe, PCSK-9 inhibitors (Repatha 140 mg or Praluent 150 mg).  Discussed mechanisms of action, dosing, side effects and potential decreases in LDL cholesterol.  Answered all patient questions.  Based on this information, patient will start PCSK-9 inhibitor.  Will submit to insurance on Monday.  Aware that patient is  on COBRA insurance thru end of 2021 and we will have to re-apply in January once she gets new insurance information.          Phillips Hay PharmD CPP [facility] [address] [place], [place] [address] [phone]

## 2020-08-25 NOTE — Patient Instructions (Addendum)
Your Results:             Your most recent labs Goal  Total Cholesterol 248 < 200  Triglycerides 92 < 150  HDL (happy/good cholesterol) 45 > 40  LDL (lousy/bad cholesterol 287 < 100   Medication changes:  We will start the paperwork to get Repatha/Praluent covered by your insurance.  Once approved Susan [name] will call to let you know when to pick up at the pharmacy.  Lab orders:  Repeat labs after 5-6 doses, about 3 months.    Thank you for choosing [facility]

## 2020-08-25 NOTE — Assessment & Plan Note (Addendum)
Patient with familial hyperlipidemia, LDL currently at 187.  Baseline in 2020 was LDL 226.  Reviewed options for lowering LDL cholesterol, including ezetimibe, PCSK-9 inhibitors (Repatha 140 mg or Praluent 150 mg).  Discussed mechanisms of action, dosing, side effects and potential decreases in LDL cholesterol.  Answered all patient questions.  Based on this information, patient will start PCSK-9 inhibitor.  Will submit to insurance on Monday.  Aware that patient is on COBRA insurance thru end of 2021 and we will have to re-apply in January once she gets new insurance information.

## 2020-09-04 ENCOUNTER — Telehealth: Payer: Self-pay

## 2020-09-04 MED ORDER — REPATHA SURECLICK 140 MG/ML ~~LOC~~ SOAJ
140.0000 mg | SUBCUTANEOUS | 11 refills | Status: DC
Start: 2020-09-04 — End: 2021-09-12

## 2020-09-04 NOTE — Telephone Encounter (Signed)
Unable to lmom the pt to start repatha, rx sent, or to inform pt that we need 53month post 1st dose lipid labs, and that they are eligible for a copay card

## 2020-12-27 DIAGNOSIS — Z1231 Encounter for screening mammogram for malignant neoplasm of breast: Secondary | ICD-10-CM | POA: Diagnosis not present

## 2020-12-27 LAB — HM MAMMOGRAPHY

## 2021-01-29 DIAGNOSIS — Z01419 Encounter for gynecological examination (general) (routine) without abnormal findings: Secondary | ICD-10-CM | POA: Diagnosis not present

## 2021-01-29 DIAGNOSIS — F419 Anxiety disorder, unspecified: Secondary | ICD-10-CM | POA: Diagnosis not present

## 2021-01-29 DIAGNOSIS — I10 Essential (primary) hypertension: Secondary | ICD-10-CM | POA: Diagnosis not present

## 2021-01-29 DIAGNOSIS — Z30431 Encounter for routine checking of intrauterine contraceptive device: Secondary | ICD-10-CM | POA: Diagnosis not present

## 2021-02-08 ENCOUNTER — Encounter: Payer: Self-pay | Admitting: Cardiovascular Disease

## 2021-02-08 ENCOUNTER — Other Ambulatory Visit: Payer: Self-pay

## 2021-02-08 ENCOUNTER — Ambulatory Visit (INDEPENDENT_AMBULATORY_CARE_PROVIDER_SITE_OTHER): Payer: BC Managed Care – PPO | Admitting: Cardiovascular Disease

## 2021-02-08 VITALS — BP 130/78 | HR 94 | Ht 62.0 in | Wt 194.4 lb

## 2021-02-08 DIAGNOSIS — I10 Essential (primary) hypertension: Secondary | ICD-10-CM

## 2021-02-08 DIAGNOSIS — E7849 Other hyperlipidemia: Secondary | ICD-10-CM

## 2021-02-08 NOTE — Patient Instructions (Addendum)
Medication Instructions:  Your physician recommends that you continue on your current medications as directed. Please refer to the Current Medication list given to you today.  *If you need a refill on your cardiac medications before your next appointment, please call your pharmacy*  Lab Work: FASTING LP/CMET SOON   ANY LAB TEST NOW  [address] [place], [place], [address] Phone: [phone]  If you have labs (blood work) drawn today and your tests are completely normal, you will receive your results only by: [name] MyChart Message (if you have MyChart) OR . A paper copy in the mail If you have any lab test that is abnormal or we need to change your treatment, we will call you to review the results.  Testing/Procedures: NONE   Follow-Up: At [facility], you and your health needs are our priority.  As part of our continuing mission to provide you with exceptional heart care, we have created designated Provider Care Teams.  These Care Teams include your primary Cardiologist (physician) and Advanced Practice Providers (APPs -  Physician Assistants and Nurse Practitioners) who all work together to provide you with the care you need, when you need it.  We recommend signing up for the patient portal called /"MyChart".  Sign up information is provided on this After Visit Summary.  MyChart is used to connect with patients for Virtual Visits (Telemedicine).  Patients are able to view lab/test results, encounter notes, upcoming appointments, etc.  Non-urgent messages can be sent to your provider as well.   To learn more about what you can do with MyChart, go to [email or web address].    Your next appointment:     08/16/21 4:00 PM DR [facility] [place] [facility] [place] [facility]

## 2021-02-08 NOTE — Progress Notes (Signed)
Cardiology Office Note   Date:  02/08/2021   ID:  Susan [name], DOB [birth year], MRN [id number]  PCP:  Dorothyann Peng, MD  Cardiologist:   Chilton Si, MD   No chief complaint on file.    History of Present Illness: Susan [name] is a 50 y.o. female with hypertension, hyperlipidemia, pre-diabetes, PCOS, and GERD here for follow-up. She was initially seen s 12/2019 for cardiovascular risk assessment.  He has several family members with cardiovascular disease and is concerned about her personal risk.  She is a Education officer, community and very active with work, Public affairs consultant, and civic activities.  She is not getting any exercise.  She does not like to exercise much.  She has no chest pain or shortness of breath with every day activities.  She has intermittent mild lower extremity edema that improves with elevation of her legs.  She denies orthopnea or PND.  Lately she has been struggling with sleep.  She tried taking gabapentin but this did not help.  She was started on Lunesta which does seem to help.  She still awakens feeling tired in the morning.  She denies snoring or daytime somnolence.  She was referred for a sleep study which she did at home.  However she does not think it was performed correctly.  She does not really think that she has sleep apnea.  She cooks at home and eats out.  She does not follow any particular diet or limit her salt intake.  She was told that she had hyperlipidemia but did not want to take a statin.  She notes that her teenage daughters have been diagnosed with hyperlipidemia.  Her blood pressure has been pretty well-controlled.  Lately she has been struggling with daily headaches.  This happens 4 to 5 days/week.  In the past she had migraines that were well-controlled with Maxalt but she has not been on it lately.    Dr. Jim Like was started rosuvastatin.  However this was stopped and switched to pravastatin due to myalgias.  She has been taking the pravastatin but lipids remain  poorly controlled. She stopped taking all antihypertensives and statin.  She was checking her blood pressures and they were controlled.  She was committed to working on diet and exercise.  She has been going to [facility] 3 days/week and exercising for 1 hour.  She feels good with exercise and has no exertional chest pain or shortness of breath, though she still does not like to do it.  However she was seen in the ED 06/2020 with 2 days of headaches and blood pressures in the 170s over 110s.  Lisinopril and hydrochlorothiazide were resumed at that time.  Since restarting the blood pressure medication her blood pressure has been much better controlled. She has been feeling well physically.  However she has been stress with work and other commitments.  She has no lower extremity edema, orthopnea, or PND.  She has not been getting much exercise lately.  She started on a medication for depression but does not recall the name.   Past Medical History:  Diagnosis Date  . Anemia 2016   with bleeding ulcer  . Arthritis    oa hands  . Constipation   . Dizziness   . GERD (gastroesophageal reflux disease)    no current meds  . GI bleeding   . Glaucoma   . Headache    headaches and migraines  . History of bleeding ulcers   . History of stomach ulcers   .  Hyperlipidemia    no cholesterol meds taken  . Hypertension   . Hypothyroidism   . Insomnia   . Iron deficiency   . Irregular periods   . Osteoarthritis of both hands   . PCOS (polycystic ovarian syndrome)   . Pre-diabetes   . Syncope   . Vitamin D deficiency     Past Surgical History:  Procedure Laterality Date  . BILATERAL SALPINGECTOMY Bilateral 03/22/2013   Procedure: BILATERAL SALPINGECTOMY;  Surgeon: Serita Kyle, MD;  Location: [facility];  Service: Gynecology;  Laterality: Bilateral;  . CARPAL TUNNEL RELEASE Right 11/15/2013   Procedure: RIGHT CARPAL TUNNEL RELEASE;  Surgeon: Nicki Reaper, MD;  Location: [facility];   Service: Orthopedics;  Laterality: Right;  . COSMETIC SURGERY  01/2015   tummy tuck   . ESOPHAGOGASTRODUODENOSCOPY N/A 10/04/2015   Procedure: ESOPHAGOGASTRODUODENOSCOPY (EGD);  Surgeon: Ruffin Frederick, MD;  Location[facility];  Service: Gastroenterology;  Laterality: N/A;  . GANGLION CYST EXCISION Right 11/15/2013   Procedure: EXCISION CYST RIGHT WRIST;  Surgeon: Nicki Reaper, MD;  Location: [facility];  Service: Orthopedics;  Laterality: Right;  . LAPAROSCOPIC GASTRIC BAND REMOVAL WITH LAPAROSCOPIC GASTRIC SLEEVE RESECTION N/A 09/23/2017   Procedure: LAPAROSCOPIC GASTRIC BAND REMOVAL WITH LAPAROSCOPIC GASTRIC SLEEVE RESECTION;  Surgeon: Luretha Murphy, MD;  Location: [facility];  Service: General;  Laterality: N/A;  . LAPAROSCOPIC GASTRIC BANDING  2010  . LAPAROSCOPY N/A 03/22/2013   Procedure: LAPAROSCOPY OPERATIVE;  Surgeon: Serita Kyle, MD;  Location: [facility];  Service: Gynecology;  Laterality: N/A;  . MOUTH SURGERY  1991  . SALPINGECTOMY  03/22/2013     Current Outpatient Medications  Medication Sig Dispense Refill  . Evolocumab (REPATHA SURECLICK) 140 MG/ML SOAJ Inject 140 mg into the skin every 14 (fourteen) days. 2 mL 11  . hydrochlorothiazide (HYDRODIURIL) 12.5 MG tablet Take 1 tablet (12.5 mg total) by mouth daily. 90 tablet 0  . levonorgestrel (MIRENA, 52 MG,) 20 MCG/24HR IUD 1 each by Intrauterine route once. Inserted 2017    . lisinopril (ZESTRIL) 10 MG tablet Take 1 tablet (10 mg total) by mouth daily. 90 tablet 1   No current facility-administered medications for this visit.    Allergies:   Crestor [rosuvastatin]    Social History:  The patient  reports that she has never smoked. She has never used smokeless tobacco. She reports current alcohol use of about 1.0 standard drink of alcohol per week. She reports that she does not use drugs.   Family History:  The patient's family history includes CAD in her maternal grandmother; Cancer in her  mother; Diabetes in her father, mother, and paternal grandmother; Diabetes gravidarum in her mother; Heart attack in her paternal grandfather; Heart disease in her mother; High blood pressure in her mother; Hypertension in her brother and father; Kidney cancer in her maternal grandmother; Obesity in her mother; Stroke in her mother; Thyroid disease in her father; Ulcers in her father.    ROS:  Please see the history of present illness.   Otherwise, review of systems are positive for none.   All other systems are reviewed and negative.    PHYSICAL EXAM: VS:  BP 130/78   Pulse 94   Ht 5\' 2"  (1.575 m)   Wt 194 lb 6.4 oz (88.2 kg)   BMI 35.56 kg/m  , BMI Body mass index is 35.56 kg/m. GENERAL:  Well appearing HEENT: Pupils equal round and reactive, fundi not visualized, oral mucosa unremarkable  NECK:  No jugular venous distention, waveform within normal limits, carotid upstroke brisk and symmetric, no bruits LUNGS:  Clear to auscultation bilaterally HEART:  RRR.  PMI not displaced or sustained,S1 and S2 within normal limits, no S3, no S4, no clicks, no rubs, no murmurs ABD:  Flat, positive bowel sounds normal in frequency in pitch, no bruits, no rebound, no guarding, no midline pulsatile mass, no hepatomegaly, no splenomegaly EXT:  2 plus pulses throughout, no edema, no cyanosis no clubbing SKIN:  No rashes no nodules NEURO:  Cranial nerves II through XII grossly intact, motor grossly intact throughout PSYCH:  Cognitively intact, oriented to person place and time   EKG:  EKG is ordered today. The ekg ordered 01/07/2020 demonstrates sinus rhythm.  Rate 89 bpm.  Low voltage unchanged from prior.   02/08/2021: Sinus rhythm.  Rate 94 bpm.  Low voltage.  Recent Labs: 08/16/2020: ALT 9; BUN 9; Creatinine, Ser 1.03; Potassium 4.3; Sodium 141    Lipid Panel    Component Value Date/Time   CHOL 248 (H) 08/16/2020 1248   TRIG 92 08/16/2020 1248   HDL 45 08/16/2020 1248   CHOLHDL 5.5 (H)  08/16/2020 1248   LDLCALC 187 (H) 08/16/2020 1248      Wt Readings from Last 3 Encounters:  02/08/21 194 lb 6.4 oz (88.2 kg)  08/21/20 184 lb (83.5 kg)  04/04/20 185 lb 3.2 oz (84 kg)      ASSESSMENT AND PLAN:  # Hypertension: Blood pressures have been well have been controlled on hydrochlorothiazide and lisinopril.  We did discuss increasing her exercise.  The goal is at least 150 minutes weekly.  # Familial hyperlipidemia: Dr. Enrique Sack LDL was 226.  She did not tolerate rosuvastatin and lipids are not controlled on pravastatin.  She started on Repatha and is doing well.  Continue Repatha and pravastatin.  50 minutes of exercise as above.  Time spent: 40 minutes-Greater than 50% of this time was spent in counseling, explanation of diagnosis, planning of further management, and coordination of care.    Current medicines are reviewed at length with the patient today.  The patient does not have concerns regarding medicines.  The following changes have been made:  PCSK9 inhibitor Labs/ tests ordered today include:   Orders Placed This Encounter  Procedures  . Lipid panel  . Comprehensive metabolic panel  . EKG 12-Lead     Disposition:   FU with Susan [name] C. Duke Salvia, MD, [facility] in 6 months.     Signed, Alleyne Lac C. Duke Salvia, MD, [name]  02/08/2021 5:19 PM    [facility]

## 2021-04-27 DIAGNOSIS — Z03818 Encounter for observation for suspected exposure to other biological agents ruled out: Secondary | ICD-10-CM | POA: Diagnosis not present

## 2021-04-27 DIAGNOSIS — Z20822 Contact with and (suspected) exposure to covid-19: Secondary | ICD-10-CM | POA: Diagnosis not present

## 2021-05-11 DIAGNOSIS — M9901 Segmental and somatic dysfunction of cervical region: Secondary | ICD-10-CM | POA: Diagnosis not present

## 2021-05-11 DIAGNOSIS — M9902 Segmental and somatic dysfunction of thoracic region: Secondary | ICD-10-CM | POA: Diagnosis not present

## 2021-05-11 DIAGNOSIS — M9903 Segmental and somatic dysfunction of lumbar region: Secondary | ICD-10-CM | POA: Diagnosis not present

## 2021-05-11 DIAGNOSIS — M9905 Segmental and somatic dysfunction of pelvic region: Secondary | ICD-10-CM | POA: Diagnosis not present

## 2021-05-11 DIAGNOSIS — M7542 Impingement syndrome of left shoulder: Secondary | ICD-10-CM | POA: Diagnosis not present

## 2021-05-16 DIAGNOSIS — M7542 Impingement syndrome of left shoulder: Secondary | ICD-10-CM | POA: Diagnosis not present

## 2021-05-16 DIAGNOSIS — M9902 Segmental and somatic dysfunction of thoracic region: Secondary | ICD-10-CM | POA: Diagnosis not present

## 2021-05-16 DIAGNOSIS — M9901 Segmental and somatic dysfunction of cervical region: Secondary | ICD-10-CM | POA: Diagnosis not present

## 2021-05-16 DIAGNOSIS — M9903 Segmental and somatic dysfunction of lumbar region: Secondary | ICD-10-CM | POA: Diagnosis not present

## 2021-05-16 DIAGNOSIS — M9905 Segmental and somatic dysfunction of pelvic region: Secondary | ICD-10-CM | POA: Diagnosis not present

## 2021-05-21 DIAGNOSIS — Z03818 Encounter for observation for suspected exposure to other biological agents ruled out: Secondary | ICD-10-CM | POA: Diagnosis not present

## 2021-05-21 DIAGNOSIS — Z20822 Contact with and (suspected) exposure to covid-19: Secondary | ICD-10-CM | POA: Diagnosis not present

## 2021-05-22 DIAGNOSIS — M9905 Segmental and somatic dysfunction of pelvic region: Secondary | ICD-10-CM | POA: Diagnosis not present

## 2021-05-22 DIAGNOSIS — M9902 Segmental and somatic dysfunction of thoracic region: Secondary | ICD-10-CM | POA: Diagnosis not present

## 2021-05-22 DIAGNOSIS — M9903 Segmental and somatic dysfunction of lumbar region: Secondary | ICD-10-CM | POA: Diagnosis not present

## 2021-05-22 DIAGNOSIS — M7542 Impingement syndrome of left shoulder: Secondary | ICD-10-CM | POA: Diagnosis not present

## 2021-05-22 DIAGNOSIS — M9901 Segmental and somatic dysfunction of cervical region: Secondary | ICD-10-CM | POA: Diagnosis not present

## 2021-06-06 DIAGNOSIS — M9905 Segmental and somatic dysfunction of pelvic region: Secondary | ICD-10-CM | POA: Diagnosis not present

## 2021-06-06 DIAGNOSIS — M7542 Impingement syndrome of left shoulder: Secondary | ICD-10-CM | POA: Diagnosis not present

## 2021-06-06 DIAGNOSIS — M9903 Segmental and somatic dysfunction of lumbar region: Secondary | ICD-10-CM | POA: Diagnosis not present

## 2021-06-06 DIAGNOSIS — M9902 Segmental and somatic dysfunction of thoracic region: Secondary | ICD-10-CM | POA: Diagnosis not present

## 2021-06-06 DIAGNOSIS — M9901 Segmental and somatic dysfunction of cervical region: Secondary | ICD-10-CM | POA: Diagnosis not present

## 2021-07-27 ENCOUNTER — Other Ambulatory Visit: Payer: Self-pay | Admitting: *Deleted

## 2021-07-27 DIAGNOSIS — I10 Essential (primary) hypertension: Secondary | ICD-10-CM | POA: Diagnosis not present

## 2021-07-27 DIAGNOSIS — E7849 Other hyperlipidemia: Secondary | ICD-10-CM | POA: Diagnosis not present

## 2021-07-27 NOTE — Progress Notes (Signed)
New orders printed -previous orders out of date

## 2021-07-28 LAB — COMPREHENSIVE METABOLIC PANEL
ALT: 16 IU/L (ref 0–32)
AST: 24 IU/L (ref 0–40)
Albumin/Globulin Ratio: 1.4 (ref 1.2–2.2)
Albumin: 3.6 g/dL — ABNORMAL LOW (ref 3.8–4.8)
Alkaline Phosphatase: 74 IU/L (ref 44–121)
BUN/Creatinine Ratio: 9 (ref 9–23)
BUN: 8 mg/dL (ref 6–24)
Bilirubin Total: 0.5 mg/dL (ref 0.0–1.2)
CO2: 29 mmol/L (ref 20–29)
Calcium: 8.8 mg/dL (ref 8.7–10.2)
Chloride: 100 mmol/L (ref 96–106)
Creatinine, Ser: 0.85 mg/dL (ref 0.57–1.00)
Globulin, Total: 2.6 g/dL (ref 1.5–4.5)
Glucose: 90 mg/dL (ref 65–99)
Potassium: 3.7 mmol/L (ref 3.5–5.2)
Sodium: 142 mmol/L (ref 134–144)
Total Protein: 6.2 g/dL (ref 6.0–8.5)
eGFR: 84 mL/min/{1.73_m2} (ref >59)

## 2021-07-28 LAB — LIPID PANEL
Chol/HDL Ratio: 3.1 ratio (ref 0.0–4.4)
Cholesterol, Total: 131 mg/dL (ref 100–199)
HDL: 42 mg/dL (ref >39)
LDL Chol Calc (NIH): 76 mg/dL (ref 0–99)
Triglycerides: 61 mg/dL (ref 0–149)
VLDL Cholesterol Cal: 13 mg/dL (ref 5–40)

## 2021-08-14 ENCOUNTER — Telehealth (HOSPITAL_BASED_OUTPATIENT_CLINIC_OR_DEPARTMENT_OTHER): Payer: Self-pay | Admitting: Cardiovascular Disease

## 2021-08-14 NOTE — Telephone Encounter (Signed)
Received fax from [facility] stating "No prior authorization is required on this medication as it is on the member's formulary; test claim paid today's date; please contact pharmacy for processing; no further action required. Cancelling request."

## 2021-08-15 NOTE — Progress Notes (Incomplete)
Cardiology Office Note   Date:  08/15/2021   ID:  Susan [name], DOB 1971/09/08, MRN [id number]  PCP:  Dorothyann Peng, MD  Cardiologist:   Carlena Bjornstad   No chief complaint on file.    History of Present Illness: Susan [name] is a 50 y.o. female with hypertension, hyperlipidemia, pre-diabetes, PCOS, and GERD here for follow-up. She was initially seen 12/2019 for cardiovascular risk assessment.  She has several family members with cardiovascular disease and is concerned about her personal risk.  She is a Education officer, community and very active with work, Public affairs consultant, and civic activities.  However, she was not formally exercising. She does not like to exercise much.  She has no chest pain or shortness of breath with every day activities.  She has intermittent mild lower extremity edema that improves with elevation of her legs.  She denies orthopnea or PND.  She had been struggling with sleep.  She tried taking gabapentin but this did not help.  She was started on Lunesta which does seem to help.  She still awakens feeling tired in the morning.  She denies snoring or daytime somnolence.  She was referred for an at home sleep study which she did not think was performed correctly.  She does not really think that she has sleep apnea.  She cooks at home and eats out.  She does not follow any particular diet or limit her salt intake.  She was told that she had hyperlipidemia but did not want to take a statin.  She noted that her teenage daughters were diagnosed with hyperlipidemia.  Her blood pressure was pretty well-controlled.  Lately she has been struggling with daily headaches.  This happens 4 to 5 days/week.  In the past she had migraines that were well-controlled with Maxalt but she has not been on it lately.    Dr. Jim Like was started on rosuvastatin.  However this was stopped and switched to pravastatin due to myalgias.  She had been taking the pravastatin but lipids remain poorly controlled. She stopped taking  all antihypertensives and statin.  She was checking her blood pressures and they were controlled.  She was committed to working on diet and exercise.  She has been going to [facility] 3 days/week and exercising for 1 hour.  She feels good with exercise and has no exertional chest pain or shortness of breath, though she still does not like to do it.  She was seen in the ED 06/2020 with 2 days of headaches and blood pressures in the 170s over 110s.  Lisinopril and hydrochlorothiazide were resumed at that time.  Since restarting the blood pressure medication her blood pressure has been much better controlled. She has been feeling well physically.  However she has been stress with work and other commitments.  She has no lower extremity edema, orthopnea, or PND.  She has not been getting much exercise lately.  She started on a medication for depression but does not recall the name.  Today,  She denies any palpitations, chest pain, or shortness of breath. No lightheadedness, headaches, syncope, orthopnea, or PND. Also has no lower extremity edema or exertional symptoms.   Past Medical History:  Diagnosis Date   Anemia 2016   with bleeding ulcer   Arthritis    oa hands   Constipation    Dizziness    GERD (gastroesophageal reflux disease)    no current meds   GI bleeding    Glaucoma    Headache  headaches and migraines   History of bleeding ulcers    History of stomach ulcers    Hyperlipidemia    no cholesterol meds taken   Hypertension    Hypothyroidism    Insomnia    Iron deficiency    Irregular periods    Osteoarthritis of both hands    PCOS (polycystic ovarian syndrome)    Pre-diabetes    Syncope    Vitamin D deficiency     Past Surgical History:  Procedure Laterality Date   BILATERAL SALPINGECTOMY Bilateral 03/22/2013   Procedure: BILATERAL SALPINGECTOMY;  Surgeon: Serita Kyle, MD;  Location: [facility];  Service: Gynecology;  Laterality: Bilateral;   CARPAL TUNNEL RELEASE Right  11/15/2013   Procedure: RIGHT CARPAL TUNNEL RELEASE;  Surgeon: Nicki Reaper, MD;  Location: [name];  Service: Orthopedics;  Laterality: Right;   COSMETIC SURGERY  01/2015   tummy tuck    ESOPHAGOGASTRODUODENOSCOPY N/A 10/04/2015   Procedure: ESOPHAGOGASTRODUODENOSCOPY (EGD);  Surgeon: Ruffin Frederick, MD;  Location: [facility];  Service: Gastroenterology;  Laterality: N/A;   GANGLION CYST EXCISION Right 11/15/2013   Procedure: EXCISION CYST RIGHT WRIST;  Surgeon: Nicki Reaper, MD;  Location: [facility];  Service: Orthopedics;  Laterality: Right;   LAPAROSCOPIC GASTRIC BAND REMOVAL WITH LAPAROSCOPIC GASTRIC SLEEVE RESECTION N/A 09/23/2017   Procedure: LAPAROSCOPIC GASTRIC BAND REMOVAL WITH LAPAROSCOPIC GASTRIC SLEEVE RESECTION;  Surgeon: Luretha Murphy, MD;  Location: [facility];  Service: General;  Laterality: N/A;   LAPAROSCOPIC GASTRIC BANDING  2010   LAPAROSCOPY N/A 03/22/2013   Procedure: LAPAROSCOPY OPERATIVE;  Surgeon: Serita Kyle, MD;  Location: [facility];  Service: Gynecology;  Laterality: N/A;   MOUTH SURGERY  1991   SALPINGECTOMY  03/22/2013     Current Outpatient Medications  Medication Sig Dispense Refill   Evolocumab (REPATHA SURECLICK) 140 MG/ML SOAJ Inject 140 mg into the skin every 14 (fourteen) days. 2 mL 11   hydrochlorothiazide (HYDRODIURIL) 12.5 MG tablet Take 1 tablet (12.5 mg total) by mouth daily. 90 tablet 0   levonorgestrel (MIRENA, 52 MG,) 20 MCG/24HR IUD 1 each by Intrauterine route once. Inserted 2017     lisinopril (ZESTRIL) 10 MG tablet Take 1 tablet (10 mg total) by mouth daily. 90 tablet 1   No current facility-administered medications for this visit.    Allergies:   Crestor [rosuvastatin]    Social History:  The patient  reports that she has never smoked. She has never used smokeless tobacco. She reports current alcohol use of about 1.0 standard drink per week. She reports that she does not use drugs.    Family History:  The patient's family history includes CAD in her maternal grandmother; Cancer in her mother; Diabetes in her father, mother, and paternal grandmother; Diabetes gravidarum in her mother; Heart attack in her paternal grandfather; Heart disease in her mother; High blood pressure in her mother; Hypertension in her brother and father; Kidney cancer in her maternal grandmother; Obesity in her mother; Stroke in her mother; Thyroid disease in her father; Ulcers in her father.    ROS:   Please see the history of present illness. (+) All other systems are reviewed and negative.    PHYSICAL EXAM: VS:  There were no vitals taken for this visit. , BMI There is no height or weight on file to calculate BMI. GENERAL:  Well appearing HEENT: Pupils equal round and reactive, fundi not visualized, oral mucosa unremarkable NECK:  No jugular venous distention, waveform within  normal limits, carotid upstroke brisk and symmetric, no bruits LUNGS:  Clear to auscultation bilaterally HEART:  RRR.  PMI not displaced or sustained,S1 and S2 within normal limits, no S3, no S4, no clicks, no rubs, no murmurs ABD:  Flat, positive bowel sounds normal in frequency in pitch, no bruits, no rebound, no guarding, no midline pulsatile mass, no hepatomegaly, no splenomegaly EXT:  2 plus pulses throughout, no edema, no cyanosis no clubbing SKIN:  No rashes no nodules NEURO:  Cranial nerves II through XII grossly intact, motor grossly intact throughout PSYCH:  Cognitively intact, oriented to person place and time   EKG:   08/16/2021: Sinus ***. Rate *** bpm. 02/08/2021: Sinus rhythm.  Rate 94 bpm.  Low voltage. 01/07/2020: Sinus rhythm.  Rate 89 bpm.  Low voltage unchanged from prior.    Other Studies: No prior CV studies available.   Recent Labs: 07/27/2021: ALT 16; BUN 8; Creatinine, Ser 0.85; Potassium 3.7; Sodium 142    Lipid Panel    Component Value Date/Time   CHOL 131 07/27/2021 1435   TRIG 61  07/27/2021 1435   HDL 42 07/27/2021 1435   CHOLHDL 3.1 07/27/2021 1435   LDLCALC 76 07/27/2021 1435    Wt Readings from Last 3 Encounters:  02/08/21 194 lb 6.4 oz (88.2 kg)  08/21/20 184 lb (83.5 kg)  04/04/20 185 lb 3.2 oz (84 kg)      ASSESSMENT AND PLAN: No problem-specific Assessment & Plan notes found for this encounter.  # Hypertension: Blood pressures have been well have been controlled on hydrochlorothiazide and lisinopril.  We did discuss increasing her exercise.  The goal is at least 150 minutes weekly.  # Familial hyperlipidemia: Dr. Enrique Sack LDL was 226.  She did not tolerate rosuvastatin and lipids are not controlled on pravastatin.  She started on Repatha and is doing well.  Continue Repatha and pravastatin.  50 minutes of exercise as above.    Current medicines are reviewed at length with the patient today.  The patient does not have concerns regarding medicines.  The following changes have been made:  PCSK9 inhibitor Labs/ tests ordered today include:   No orders of the defined types were placed in this encounter.    Disposition:   FU with Tiffany C. Duke Salvia, MD, [facility] in ***6 months.   I,Mathew Stumpf,acting as a Neurosurgeon for Chilton Si, MD.,have documented all relevant documentation on the behalf of Chilton Si, MD,as directed by  Chilton Si, MD while in the presence of Chilton Si, MD.  ***  Signed, Tiffany C. Duke Salvia, MD, [facility]  08/15/2021 7:42 AM    [facility]

## 2021-08-16 ENCOUNTER — Ambulatory Visit (HOSPITAL_BASED_OUTPATIENT_CLINIC_OR_DEPARTMENT_OTHER): Payer: BC Managed Care – PPO | Admitting: Cardiovascular Disease

## 2021-09-12 ENCOUNTER — Other Ambulatory Visit (HOSPITAL_BASED_OUTPATIENT_CLINIC_OR_DEPARTMENT_OTHER): Payer: Self-pay | Admitting: *Deleted

## 2021-09-12 ENCOUNTER — Telehealth: Payer: Self-pay | Admitting: Cardiovascular Disease

## 2021-09-12 MED ORDER — REPATHA SURECLICK 140 MG/ML ~~LOC~~ SOAJ: 140.0000 mg | SUBCUTANEOUS | 11 refills | Status: DC

## 2021-09-12 NOTE — Telephone Encounter (Signed)
Refilled Repatha as requeed

## 2021-09-18 NOTE — Telephone Encounter (Signed)
Refilled Repatha

## 2021-11-01 ENCOUNTER — Encounter: Payer: Managed Care, Other (non HMO) | Admitting: Internal Medicine

## 2021-11-20 DIAGNOSIS — N6481 Ptosis of breast: Secondary | ICD-10-CM | POA: Diagnosis not present

## 2021-11-25 HISTORY — PX: REDUCTION MAMMAPLASTY: SUR839

## 2021-12-31 ENCOUNTER — Encounter: Payer: Self-pay | Admitting: Internal Medicine

## 2021-12-31 ENCOUNTER — Telehealth: Payer: Self-pay | Admitting: Internal Medicine

## 2021-12-31 NOTE — Telephone Encounter (Signed)
Called pt to reschedule phy due to no show no answer and VM full

## 2021-12-31 NOTE — Progress Notes (Signed)
No show

## 2022-01-25 DIAGNOSIS — Z1231 Encounter for screening mammogram for malignant neoplasm of breast: Secondary | ICD-10-CM | POA: Diagnosis not present

## 2022-01-25 LAB — HM MAMMOGRAPHY

## 2022-01-30 DIAGNOSIS — Z01419 Encounter for gynecological examination (general) (routine) without abnormal findings: Secondary | ICD-10-CM | POA: Diagnosis not present

## 2022-01-30 DIAGNOSIS — T8332XA Displacement of intrauterine contraceptive device, initial encounter: Secondary | ICD-10-CM | POA: Diagnosis not present

## 2022-01-30 DIAGNOSIS — Z30431 Encounter for routine checking of intrauterine contraceptive device: Secondary | ICD-10-CM | POA: Diagnosis not present

## 2022-04-02 DIAGNOSIS — D251 Intramural leiomyoma of uterus: Secondary | ICD-10-CM | POA: Diagnosis not present

## 2022-04-02 DIAGNOSIS — T8332XA Displacement of intrauterine contraceptive device, initial encounter: Secondary | ICD-10-CM | POA: Diagnosis not present

## 2022-04-19 ENCOUNTER — Encounter (HOSPITAL_COMMUNITY): Payer: Self-pay | Admitting: *Deleted

## 2022-06-11 ENCOUNTER — Telehealth (HOSPITAL_BASED_OUTPATIENT_CLINIC_OR_DEPARTMENT_OTHER): Payer: Self-pay | Admitting: Cardiovascular Disease

## 2022-06-11 MED ORDER — REPATHA SURECLICK 140 MG/ML ~~LOC~~ SOAJ: 140.0000 mg | SUBCUTANEOUS | 3 refills | Status: DC

## 2022-06-11 NOTE — Telephone Encounter (Signed)
*  STAT* If patient is at the pharmacy, call can be transferred to refill team.   1. Which medications need to be refilled? (please list name of each medication and dose if known)  Evolocumab (REPATHA SURECLICK) 140 MG/ML SOAJ  2. Which pharmacy/location (including street and city if local pharmacy) is medication to be sent to? [id number] - [place], [place] - [address] AT [facility] OF [address]  3. Do they need a 30 day or 90 day supply? Needs enough to last until upcoming appt in October

## 2022-06-11 NOTE — Telephone Encounter (Signed)
Rx request sent to pharmacy.  

## 2022-07-01 ENCOUNTER — Ambulatory Visit (HOSPITAL_BASED_OUTPATIENT_CLINIC_OR_DEPARTMENT_OTHER): Payer: BC Managed Care – PPO | Admitting: Cardiovascular Disease

## 2022-07-03 ENCOUNTER — Encounter (INDEPENDENT_AMBULATORY_CARE_PROVIDER_SITE_OTHER): Payer: Self-pay

## 2022-09-06 ENCOUNTER — Ambulatory Visit (HOSPITAL_BASED_OUTPATIENT_CLINIC_OR_DEPARTMENT_OTHER): Payer: BC Managed Care – PPO | Admitting: Cardiovascular Disease

## 2022-09-30 ENCOUNTER — Encounter (HOSPITAL_BASED_OUTPATIENT_CLINIC_OR_DEPARTMENT_OTHER): Payer: Self-pay | Admitting: Cardiovascular Disease

## 2022-09-30 ENCOUNTER — Ambulatory Visit (HOSPITAL_BASED_OUTPATIENT_CLINIC_OR_DEPARTMENT_OTHER): Payer: BC Managed Care – PPO | Admitting: Cardiovascular Disease

## 2022-09-30 VITALS — BP 142/92 | HR 70 | Ht 62.0 in | Wt 137.5 lb

## 2022-09-30 DIAGNOSIS — E7849 Other hyperlipidemia: Secondary | ICD-10-CM

## 2022-09-30 DIAGNOSIS — I10 Essential (primary) hypertension: Secondary | ICD-10-CM | POA: Diagnosis not present

## 2022-09-30 DIAGNOSIS — Z5181 Encounter for therapeutic drug level monitoring: Secondary | ICD-10-CM

## 2022-09-30 MED ORDER — REPATHA SURECLICK 140 MG/ML ~~LOC~~ SOAJ: 140.0000 mg | SUBCUTANEOUS | 3 refills | Status: DC

## 2022-09-30 MED ORDER — VALSARTAN-HYDROCHLOROTHIAZIDE 160-12.5 MG PO TABS: 1.0000 | ORAL_TABLET | Freq: Every day | ORAL | 3 refills | Status: DC

## 2022-09-30 NOTE — Patient Instructions (Addendum)
Medication Instructions:  STOP HYDROCHLOROTHIAZIDE  START VALSARTAN HCT 160-12.5 MG DAILY    *If you need a refill on your cardiac medications before your next appointment, please call your pharmacy*  Lab Work: FASTING LP/CMET IN 1 WEEK   FASTING LP/CMET 3 MONTHS  If you have labs (blood work) drawn today and your tests are completely normal, you will receive your results only by: [place] (if you have MyChart) OR A paper copy in the mail If you have any lab test that is abnormal or we need to change your treatment, we will call you to review the results.   Testing/Procedures: NONE  Follow-Up:    10/29/2022 10:00 AM  MYCHART VISIT WITH DR [place]   ____________________________________________________________________________ [employer] with Phil for exercise

## 2022-09-30 NOTE — Assessment & Plan Note (Addendum)
Blood pressure is poorly controlled.  She stopped taking lisinopril.  Would prefer that she be on an ARB instead of an ACE inhibitor.  We will switch her to HCTZ/valsartan and add 12.5/160 mg daily.  Check a CMP in a week.  Blood pressure goal is less than 130/80.  She will check her blood pressures at home and bring to follow-up.  She really prefers not to be on medications if at all possible.  We did briefly discussed renal denervation as a possible option in the future.

## 2022-09-30 NOTE — Assessment & Plan Note (Addendum)
LDL has been as high as 226.  She did not tolerate rosuvastatin and lipids were not at goal on pravastatin.  She did well on Repatha.  She has been out of it recently.  We will resume Repatha and check lipids and a CMP next week and again in 3 months.  She was congratulated on her dietary changes and weight loss.  Recommend that she work on increasing her exercise to least 150 minutes weekly.  Check Lp(a).

## 2022-09-30 NOTE — Progress Notes (Signed)
Cardiology Office Note   Date:  09/30/2022   ID:  Susan [name], DOB 01-27-1971, MRN [id number]  PCP:  Dorothyann Peng, MD  Cardiologist:   Chilton Si, MD   No chief complaint on file.    History of Present Illness: Susan [name] is a 51 y.o. female with hypertension, hyperlipidemia, pre-diabetes, PCOS, and GERD here for follow-up. She was initially seen s 12/2019 for cardiovascular risk assessment.  He has several family members with cardiovascular disease and is concerned about her personal risk.  She is a Education officer, community and very active with work, Public affairs consultant, and civic activities.  She is not getting any exercise.  She does not like to exercise much.  She has no chest pain or shortness of breath with every day activities.  She has intermittent mild lower extremity edema that improves with elevation of her legs.  She denies orthopnea or PND.  Lately she has been struggling with sleep.  She tried taking gabapentin but this did not help.  She was started on Lunesta which does seem to help.  She still awakens feeling tired in the morning.  She denies snoring or daytime somnolence.  She was referred for a sleep study which she did at home.  However she does not think it was performed correctly.  She does not really think that she has sleep apnea.  She cooks at home and eats out.  She does not follow any particular diet or limit her salt intake.  She was told that she had hyperlipidemia but did not want to take a statin.  She notes that her teenage daughters have been diagnosed with hyperlipidemia.  Her blood pressure has been pretty well-controlled.  Lately she has been struggling with daily headaches.  This happens 4 to 5 days/week.  In the past she had migraines that were well-controlled with Maxalt but she has not been on it lately.    Dr. Jim Like was started rosuvastatin.  However this was stopped and switched to pravastatin due to myalgias.  She has been taking the pravastatin but lipids remain  poorly controlled. She stopped taking all antihypertensives and statin.  She was checking her blood pressures and they were controlled.  She was committed to working on diet and exercise. She has been going to [facility] 3 days/week and exercising for 1 hour. She feels good with exercise and has no exertional chest pain or shortness of breath, though she still does not like to do it.  However she was seen in the ED 06/2020 with 2 days of headaches and blood pressures in the 170s over 110s.  Lisinopril and hydrochlorothiazide were resumed at that time.   Today, she says she has been feeling fine. She has not been recording her blood pressure at home. In clinic, her blood pressure was 140/100. Upon recheck, it was 142/92. She has not been exercising much lately. She has been having some stressful situations be relieved, with her daughters going to college among those. She denies any palpitations, chest pain, shortness of breath, or peripheral edema. No lightheadedness, headaches, syncope, orthopnea, or PND.  Past Medical History:  Diagnosis Date   Anemia 2016   with bleeding ulcer   Arthritis    oa hands   Constipation    Dizziness    GERD (gastroesophageal reflux disease)    no current meds   GI bleeding    Glaucoma    Headache    headaches and migraines   History of bleeding ulcers  History of stomach ulcers    Hypertension    Hypothyroidism    Insomnia    Iron deficiency    Irregular periods    Osteoarthritis of both hands    PCOS (polycystic ovarian syndrome)    Pre-diabetes    Syncope    Vitamin D deficiency     Past Surgical History:  Procedure Laterality Date   BILATERAL SALPINGECTOMY Bilateral 03/22/2013   Procedure: BILATERAL SALPINGECTOMY;  Surgeon: Serita Kyle, MD;  Location: [facility];  Service: Gynecology;  Laterality: Bilateral;   CARPAL TUNNEL RELEASE Right 11/15/2013   Procedure: RIGHT CARPAL TUNNEL RELEASE;  Surgeon: Nicki Reaper, MD;  Location: [facility];  Service: Orthopedics;  Laterality: Right;   COSMETIC SURGERY  01/2015   tummy tuck    ESOPHAGOGASTRODUODENOSCOPY N/A 10/04/2015   Procedure: ESOPHAGOGASTRODUODENOSCOPY (EGD);  Surgeon: Ruffin Frederick, MD;  Location: [facility];  Service: Gastroenterology;  Laterality: N/A;   GANGLION CYST EXCISION Right 11/15/2013   Procedure: EXCISION CYST RIGHT WRIST;  Surgeon: Nicki Reaper, MD;  Location: [facility];  Service: Orthopedics;  Laterality: Right;   LAPAROSCOPIC GASTRIC BAND REMOVAL WITH LAPAROSCOPIC GASTRIC SLEEVE RESECTION N/A 09/23/2017   Procedure: LAPAROSCOPIC GASTRIC BAND REMOVAL WITH LAPAROSCOPIC GASTRIC SLEEVE RESECTION;  Surgeon: Luretha Murphy, MD;  Location: [facility];  Service: General;  Laterality: N/A;   LAPAROSCOPIC GASTRIC BANDING  2010   LAPAROSCOPY N/A 03/22/2013   Procedure: LAPAROSCOPY OPERATIVE;  Surgeon: Serita Kyle, MD;  Location: [facility];  Service: Gynecology;  Laterality: N/A;   MOUTH SURGERY  1991   SALPINGECTOMY  03/22/2013     Current Outpatient Medications  Medication Sig Dispense Refill   valsartan-hydrochlorothiazide (DIOVAN HCT) 160-12.5 MG tablet Take 1 tablet by mouth daily. 90 tablet 3   Evolocumab (REPATHA SURECLICK) 140 MG/ML SOAJ Inject 140 mg into the skin every 14 (fourteen) days. 2 mL 3   No current facility-administered medications for this visit.    Allergies:   Crestor [rosuvastatin]    Social History:  The patient  reports that she has never smoked. She has never used smokeless tobacco. She reports current alcohol use of about 1.0 standard drink of alcohol per week. She reports that she does not use drugs.   Family History:  The patient's family history includes CAD in her maternal grandmother; Cancer in her mother; Diabetes in her father, mother, and paternal grandmother; Diabetes gravidarum in her mother; Heart attack in her paternal grandfather; Heart disease in her mother; High blood pressure in her  mother; Hypertension in her brother, father, and mother; Kidney cancer in her maternal grandmother; Obesity in her mother; Stroke in her mother; Thyroid disease in her father; Ulcers in her father.    ROS:  Please see the history of present illness.  All other systems are reviewed and negative.    PHYSICAL EXAM: VS:  BP (!) 142/92 (BP Location: Left Arm, Patient Position: Sitting, Cuff Size: Normal)   Pulse 70   Ht 5\' 2"  (1.575 m)   Wt 137 lb 8 oz (62.4 kg)   SpO2 99%   BMI 25.15 kg/m  , BMI Body mass index is 25.15 kg/m. GENERAL:  Well appearing HEENT: Pupils equal round and reactive, fundi not visualized, oral mucosa unremarkable NECK:  No jugular venous distention, waveform within normal limits, carotid upstroke brisk and symmetric, no bruits LUNGS:  Clear to auscultation bilaterally HEART:  RRR.  PMI not displaced or sustained,S1 and S2 within normal limits, no  S3, no S4, no clicks, no rubs, no murmurs ABD:  Flat, positive bowel sounds normal in frequency in pitch, no bruits, no rebound, no guarding, no midline pulsatile mass, no hepatomegaly, no splenomegaly EXT:  2 plus pulses throughout, no edema, no cyanosis no clubbing SKIN:  No rashes no nodules NEURO:  Cranial nerves II through XII grossly intact, motor grossly intact throughout PSYCH:  Cognitively intact, oriented to person place and time   EKG: EKG is personally reviewed. 09/30/22: Sinus rhythm. Rate 63 bpm. Low voltage. Anterior T wave inversions.  02/08/21: Sinus rhythm.  Rate 94 bpm.  Low voltage.  01/07/20: Sinus rhythm.  Rate 89 bpm.  Low voltage unchanged from prior.    Recent Labs: No results found for requested labs within last 365 days.    Lipid Panel    Component Value Date/Time   CHOL 131 07/27/2021 1435   TRIG 61 07/27/2021 1435   HDL 42 07/27/2021 1435   CHOLHDL 3.1 07/27/2021 1435   LDLCALC 76 07/27/2021 1435      Wt Readings from Last 3 Encounters:  09/30/22 137 lb 8 oz (62.4 kg)  02/08/21  194 lb 6.4 oz (88.2 kg)  08/21/20 184 lb (83.5 kg)      ASSESSMENT AND PLAN:  Hypertension Blood pressure is poorly controlled.  She stopped taking lisinopril.  Would prefer that she be on an ARB instead of an ACE inhibitor.  We will switch her to HCTZ/valsartan and add 12.5/160 mg daily.  Check a CMP in a week.  Blood pressure goal is less than 130/80.  She will check her blood pressures at home and bring to follow-up.  She really prefers not to be on medications if at all possible.  We did briefly discussed renal denervation as a possible option in the future.  Familial hyperlipidemia LDL has been as high as 226.  She did not tolerate rosuvastatin and lipids were not at goal on pravastatin.  She did well on Repatha.  She has been out of it recently.  We will resume Repatha and check lipids and a CMP next week and again in 3 months.  She was congratulated on her dietary changes and weight loss.  Recommend that she work on increasing her exercise to least 150 minutes weekly.  Check Lp(a).  Current medicines are reviewed at length with the patient today.  The patient does not have concerns regarding medicines.  The following changes have been made:  PCSK9 inhibitor Labs/ tests ordered today include:   Orders Placed This Encounter  Procedures   Lipid panel   Comprehensive metabolic panel   Lipid panel   Comprehensive metabolic panel   EKG [id number]    Disposition:  FU with Harlie Ragle C. Oval Linsey, MD, [facility] virtually in 1 month.   I,Breanna Adamick,acting as a scribe for [name], MD.,have documented all relevant documentation on the behalf of [name], MD,as directed by  [name], MD while in the presence of [name], MD.  I, [place] Oval Linsey, MD have reviewed all documentation for this visit.  The documentation of the exam, diagnosis, procedures, and orders on 09/30/2022 are all accurate and complete.   Signed, Magin Balbi C. Oval Linsey, MD, [facility]  09/30/2022  5:59 PM    [facility]

## 2022-10-04 ENCOUNTER — Encounter: Payer: Self-pay | Admitting: Cardiovascular Disease

## 2022-10-16 DIAGNOSIS — I10 Essential (primary) hypertension: Secondary | ICD-10-CM | POA: Diagnosis not present

## 2022-10-16 DIAGNOSIS — Z5181 Encounter for therapeutic drug level monitoring: Secondary | ICD-10-CM | POA: Diagnosis not present

## 2022-10-16 DIAGNOSIS — E7849 Other hyperlipidemia: Secondary | ICD-10-CM | POA: Diagnosis not present

## 2022-10-17 LAB — COMPREHENSIVE METABOLIC PANEL
ALT: 22 IU/L (ref 0–32)
AST: 26 IU/L (ref 0–40)
Albumin/Globulin Ratio: 1.5 (ref 1.2–2.2)
Albumin: 4.1 g/dL (ref 3.9–4.9)
Alkaline Phosphatase: 82 IU/L (ref 44–121)
BUN/Creatinine Ratio: 14 (ref 9–23)
BUN: 11 mg/dL (ref 6–24)
Bilirubin Total: 0.6 mg/dL (ref 0.0–1.2)
CO2: 24 mmol/L (ref 20–29)
Calcium: 9 mg/dL (ref 8.7–10.2)
Chloride: 99 mmol/L (ref 96–106)
Creatinine, Ser: 0.78 mg/dL (ref 0.57–1.00)
Globulin, Total: 2.7 g/dL (ref 1.5–4.5)
Glucose: 74 mg/dL (ref 70–99)
Potassium: 4.3 mmol/L (ref 3.5–5.2)
Sodium: 138 mmol/L (ref 134–144)
Total Protein: 6.8 g/dL (ref 6.0–8.5)
eGFR: 92 mL/min/{1.73_m2} (ref >59)

## 2022-10-17 LAB — LIPID PANEL
Chol/HDL Ratio: 1.9 ratio (ref 0.0–4.4)
Cholesterol, Total: 110 mg/dL (ref 100–199)
HDL: 58 mg/dL (ref >39)
LDL Chol Calc (NIH): 39 mg/dL (ref 0–99)
Triglycerides: 59 mg/dL (ref 0–149)
VLDL Cholesterol Cal: 13 mg/dL (ref 5–40)

## 2022-10-20 ENCOUNTER — Other Ambulatory Visit: Payer: Self-pay | Admitting: Internal Medicine

## 2022-10-29 ENCOUNTER — Telehealth (HOSPITAL_BASED_OUTPATIENT_CLINIC_OR_DEPARTMENT_OTHER): Payer: BC Managed Care – PPO | Admitting: Cardiovascular Disease

## 2022-11-04 ENCOUNTER — Telehealth (INDEPENDENT_AMBULATORY_CARE_PROVIDER_SITE_OTHER): Payer: BC Managed Care – PPO | Admitting: Cardiovascular Disease

## 2022-11-04 ENCOUNTER — Telehealth (HOSPITAL_BASED_OUTPATIENT_CLINIC_OR_DEPARTMENT_OTHER): Payer: Self-pay | Admitting: *Deleted

## 2022-11-04 ENCOUNTER — Encounter (HOSPITAL_BASED_OUTPATIENT_CLINIC_OR_DEPARTMENT_OTHER): Payer: Self-pay | Admitting: Cardiovascular Disease

## 2022-11-04 VITALS — BP 154/98 | Ht 62.0 in | Wt 138.0 lb

## 2022-11-04 DIAGNOSIS — I10 Essential (primary) hypertension: Secondary | ICD-10-CM | POA: Diagnosis not present

## 2022-11-04 DIAGNOSIS — E7849 Other hyperlipidemia: Secondary | ICD-10-CM | POA: Diagnosis not present

## 2022-11-04 MED ORDER — VALSARTAN 160 MG PO TABS: 160.0000 mg | ORAL_TABLET | Freq: Every day | ORAL | 3 refills | Status: DC

## 2022-11-04 MED ORDER — AMLODIPINE BESYLATE 5 MG PO TABS: 5.0000 mg | ORAL_TABLET | Freq: Every day | ORAL | 3 refills | Status: DC

## 2022-11-04 NOTE — Patient Instructions (Addendum)
Medication Instructions:  STOP VALSARTAN HCT   START VALSARTAN 160 MG DAILY   START AMLODIPINE 5 MG DAILY  *If you need a refill on your cardiac medications before your next appointment, please call your pharmacy*  Lab Work: NONE   Testing/Procedures: NONE   Follow-Up: At [facility], you and your health needs are our priority.  As part of our continuing mission to provide you with exceptional heart care, we have created designated Provider Care Teams.  These Care Teams include your primary Cardiologist (physician) and Advanced Practice Providers (APPs -  Physician Assistants and Nurse Practitioners) who all work together to provide you with the care you need, when you need it.  We recommend signing up for the patient portal called "MyChart".  Sign up information is provided on this After Visit Summary.  MyChart is used to connect with patients for Virtual Visits (Telemedicine).  Patients are able to view lab/test results, encounter notes, upcoming appointments, etc.  Non-urgent messages can be sent to your provider as well.   To learn more about what you can do with MyChart, go to [email or web address].    Your next appointment:   4-6 WEEKS WITH DR [facility] IN OFFICE OR VIRTUAL

## 2022-11-04 NOTE — Assessment & Plan Note (Signed)
Lipids are now very well-controlled on Repatha.  LDL decreased from 193 to 39.  Continue current therapy, diet and exercise.

## 2022-11-04 NOTE — Telephone Encounter (Signed)
Left message to call back to go over AVS from today's visit and schedule 4-6 week in office or virtual follow up with Dr Duke Salvia

## 2022-11-04 NOTE — Progress Notes (Signed)
I connected with  Susan [name] on 12/01/22 by a video enabled telemedicine application and verified that I am speaking with the correct person using two identifiers.   I discussed the limitations of evaluation and management by telemedicine. The patient expressed understanding and agreed to proceed.  Cardiology Office Note   Date:  12/01/2022   ID:  Susan [name], DOB 03/11/1971, MRN [id number]  PCP:  Susan Peng, MD  Cardiologist:   Susan Si, MD   No chief complaint on file.    Patient Location: work Dispensing optician: office  History of Present Illness: Susan [name] is a 51 y.o. female with hypertension, hyperlipidemia, pre-diabetes, PCOS, and GERD here for follow-up. She was initially seen s 12/2019 for cardiovascular risk assessment.  He has several family members with cardiovascular disease and is concerned about her personal risk.  She is a Education officer, community and very active with work, Public affairs consultant, and civic activities.  She is not getting any exercise.  She does not [name] to exercise much.  She has no chest pain or shortness of breath with every day activities.  She has intermittent mild lower extremity edema that improves with elevation of her legs.  She denies orthopnea or PND.  Lately she has been struggling with sleep.  She tried taking gabapentin but this did not help.  She was started on Lunesta which does seem to help.  She still awakens feeling tired in the morning.  She denies snoring or daytime somnolence.  She was referred for a sleep study which she did at home.  However she does not think it was performed correctly.  She does not really think that she has sleep apnea.  She cooks at home and eats out.  She does not follow any particular diet or limit her salt intake.  She was told that she had hyperlipidemia but did not want to take a statin.  She notes that her teenage daughters have been diagnosed with hyperlipidemia.  Her blood pressure has been pretty well-controlled.   Lately she has been struggling with daily headaches.  This happens 4 to 5 days/week.  In the past she had migraines that were well-controlled with Maxalt but she has not been on it lately.    Dr. Jim [name] was started rosuvastatin.  However this was stopped and switched to pravastatin due to myalgias.  She has been taking the pravastatin but lipids remain poorly controlled. She stopped taking all antihypertensives and statin.  She was checking her blood pressures and they were controlled.  She was committed to working on diet and exercise. She has been going to [facility] 3 days/week and exercising for 1 hour. She feels good with exercise and has no exertional chest pain or shortness of breath, though she still does not [name] to do it.  However she was seen in the ED 06/2020 with 2 days of headaches and blood pressures in the 170s over 110s.  Lisinopril and hydrochlorothiazide were resumed at that time.   At the last visit, her blood pressures were above goal and she attributed it to stress. She had stopped taking her Lisinopril. She was switched to HCTZ/Valsartan. She was also encouraged to start back on her Repatha.  Today, she states that she has been doing okay. She states that she has difficulty taking her Valsartan-HCTZ regularly as she does not want to take it before work due to secondary frequent urination and when she takes it before bed the nocturia keeps her up at night. Her home  BP today is 154/98. She does not check it regularly at home. Her LDL has improved to 39.  Past Medical History:  Diagnosis Date   Anemia 2016   with bleeding ulcer   Arthritis    oa hands   Constipation    Dizziness    GERD (gastroesophageal reflux disease)    no current meds   GI bleeding    Glaucoma    Headache    headaches and migraines   History of bleeding ulcers    History of stomach ulcers    Hypertension    Hypothyroidism    Insomnia    Iron deficiency    Irregular periods    Osteoarthritis of both  hands    PCOS (polycystic ovarian syndrome)    Pre-diabetes    Syncope    Vitamin D deficiency     Past Surgical History:  Procedure Laterality Date   BILATERAL SALPINGECTOMY Bilateral 03/22/2013   Procedure: BILATERAL SALPINGECTOMY;  Surgeon: Serita Kyle, MD;  Location: [facility];  Service: Gynecology;  Laterality: Bilateral;   CARPAL TUNNEL RELEASE Right 11/15/2013   Procedure: RIGHT CARPAL TUNNEL RELEASE;  Surgeon: Nicki Reaper, MD;  Location: [facility];  Service: Orthopedics;  Laterality: Right;   COSMETIC SURGERY  01/2015   tummy tuck    ESOPHAGOGASTRODUODENOSCOPY N/A 10/04/2015   Procedure: ESOPHAGOGASTRODUODENOSCOPY (EGD);  Surgeon: Ruffin Frederick, MD;  Location: [facility];  Service: Gastroenterology;  Laterality: N/A;   GANGLION CYST EXCISION Right 11/15/2013   Procedure: EXCISION CYST RIGHT WRIST;  Surgeon: Nicki Reaper, MD;  Location: [facility];  Service: Orthopedics;  Laterality: Right;   LAPAROSCOPIC GASTRIC BAND REMOVAL WITH LAPAROSCOPIC GASTRIC SLEEVE RESECTION N/A 09/23/2017   Procedure: LAPAROSCOPIC GASTRIC BAND REMOVAL WITH LAPAROSCOPIC GASTRIC SLEEVE RESECTION;  Surgeon: Luretha Murphy, MD;  Location: [facility];  Service: General;  Laterality: N/A;   LAPAROSCOPIC GASTRIC BANDING  2010   LAPAROSCOPY N/A 03/22/2013   Procedure: LAPAROSCOPY OPERATIVE;  Surgeon: Serita Kyle, MD;  Location: [facility];  Service: Gynecology;  Laterality: N/A;   MOUTH SURGERY  1991   SALPINGECTOMY  03/22/2013     Current Outpatient Medications  Medication Sig Dispense Refill   amLODipine (NORVASC) 5 MG tablet Take 1 tablet (5 mg total) by mouth daily. 90 tablet 3   Evolocumab (REPATHA SURECLICK) 140 MG/ML SOAJ Inject 140 mg into the skin every 14 (fourteen) days. 2 mL 3   valsartan (DIOVAN) 160 MG tablet Take 1 tablet (160 mg total) by mouth daily. 90 tablet 3   No current facility-administered medications for this visit.    Allergies:    Crestor [rosuvastatin]    Social History:  The patient  reports that she has never smoked. She has never used smokeless tobacco. She reports current alcohol use of about 1.0 standard drink of alcohol per week. She reports that she does not use drugs.   Family History:  The patient's family history includes CAD in her maternal grandmother; Cancer in her mother; Diabetes in her father, mother, and paternal grandmother; Diabetes gravidarum in her mother; Heart attack in her paternal grandfather; Heart disease in her mother; High blood pressure in her mother; Hypertension in her brother, father, and mother; Kidney cancer in her maternal grandmother; Obesity in her mother; Stroke in her mother; Thyroid disease in her father; Ulcers in her father.    ROS:  Please see the history of present illness.  + Frequent urination All other systems are reviewed and  negative.    PHYSICAL EXAM: VS:  BP (!) 154/98 (Patient Position: Sitting)   Ht 5\' 2"  (1.575 m)   Wt 138 lb (62.6 kg)   BMI 25.24 kg/m  , BMI Body mass index is 25.24 kg/m. GENERAL:  Well appearing HEENT: Pupils equal round and reactive, fundi not visualized, oral mucosa unremarkable NECK:  No jugular venous distention, waveform within normal limits, carotid upstroke brisk and symmetric, no bruits LUNGS:  Clear to auscultation bilaterally HEART:  RRR.  PMI not displaced or sustained,S1 and S2 within normal limits, no S3, no S4, no clicks, no rubs, no murmurs ABD:  Flat, positive bowel sounds normal in frequency in pitch, no bruits, no rebound, no guarding, no midline pulsatile mass, no hepatomegaly, no splenomegaly EXT:  2 plus pulses throughout, no edema, no cyanosis no clubbing SKIN:  No rashes no nodules NEURO:  Cranial nerves II through XII grossly intact, motor grossly intact throughout PSYCH:  Cognitively intact, oriented to person place and time   EKG: EKG is personally reviewed. EKG was not ordered today. 09/30/22: Sinus rhythm.  Rate 63 bpm. Low voltage. Anterior T wave inversions.  02/08/21: Sinus rhythm.  Rate 94 bpm.  Low voltage.  01/07/20: Sinus rhythm.  Rate 89 bpm.  Low voltage unchanged from prior.    Recent Labs: 10/16/2022: ALT 22; BUN 11; Creatinine, Ser 0.78; Potassium 4.3; Sodium 138    Lipid Panel    Component Value Date/Time   CHOL 110 10/16/2022 1146   TRIG 59 10/16/2022 1146   HDL 58 10/16/2022 1146   CHOLHDL 1.9 10/16/2022 1146   LDLCALC 39 10/16/2022 1146      Wt Readings from Last 3 Encounters:  11/04/22 138 lb (62.6 kg)  09/30/22 137 lb 8 oz (62.4 kg)  02/08/21 194 lb 6.4 oz (88.2 kg)      ASSESSMENT AND PLAN:  Hypertension Blood pressure is uncontrolled.  She hasn't been checking it much at home.  She takes it in the evening due to frequent urination at work.  However, she then has nocturia.  We will stop HCTZ and continue valsartan. Add amlodipine 5mg  daily.  Track BP twice daily for one week.  Encourage at least 150 minutes of exercise and sodium restriction.  Familial hyperlipidemia Lipids are now very well-controlled on Repatha.  LDL decreased from 193 to 39.  Continue current therapy, diet and exercise.    Current medicines are reviewed at length with the patient today.  The patient has concerns regarding medicines. Nocturia associated with Valsartan-HCTZ.  The following changes have been made:   Labs/ tests ordered today include:   No orders of the defined types were placed in this encounter.   Disposition: F/u in 4-6 weeks   I provided 22 minutes of face-to-face video visit time during this encounter, and > 50% was spent counseling as documented under my assessment & plan.   I,Alexis Herring,acting as a 02/10/21 for , MD.,have documented all relevant documentation on the behalf of Neurosurgeon, MD,as directed by  Susan Si, MD while in the presence of Susan Si, MD.  I, Sharmain Lastra C. Susan Si, MD have reviewed all documentation for  this visit.  The documentation of the exam, diagnosis, procedures, and orders on 12/01/2022 are all accurate and complete.   Signed, Brooke Payes C. Duke Salvia, MD, [facility]  12/01/2022 7:16 AM    [facility]

## 2022-11-04 NOTE — Assessment & Plan Note (Signed)
Blood pressure is uncontrolled.  She hasn't been checking it much at home.  She takes it in the evening due to frequent urination at work.  However, she then has nocturia.  We will stop HCTZ and continue valsartan. Add amlodipine 5mg  daily.  Track BP twice daily for one week.  Encourage at least 150 minutes of exercise and sodium restriction.

## 2022-12-01 ENCOUNTER — Encounter (HOSPITAL_BASED_OUTPATIENT_CLINIC_OR_DEPARTMENT_OTHER): Payer: Self-pay | Admitting: Cardiovascular Disease

## 2022-12-04 ENCOUNTER — Telehealth (HOSPITAL_BASED_OUTPATIENT_CLINIC_OR_DEPARTMENT_OTHER): Payer: Self-pay | Admitting: *Deleted

## 2022-12-04 MED ORDER — AMLODIPINE BESYLATE 10 MG PO TABS: 10.0000 mg | ORAL_TABLET | Freq: Every day | ORAL | 3 refills | Status: DC

## 2022-12-04 NOTE — Telephone Encounter (Signed)
-----   Message from [name], MD sent at 12/04/2022 11:47 AM EST ----- Spoke to patient.  Please increase amlodipine to 10mg  at the pharmacy.  Thanks! Susan [name]

## 2022-12-04 NOTE — Telephone Encounter (Signed)
New Rx sent to pharmacy

## 2022-12-04 NOTE — Telephone Encounter (Signed)
Left message to call back  

## 2023-01-30 DIAGNOSIS — Z01419 Encounter for gynecological examination (general) (routine) without abnormal findings: Secondary | ICD-10-CM | POA: Diagnosis not present

## 2023-01-30 NOTE — Telephone Encounter (Signed)
Multiple contact attempts, no answer or call back. Pt overdue for follow up. Please scheduled with Dr. Oval Linsey if/when patient returns call

## 2023-02-04 LAB — HM PAP SMEAR

## 2023-02-07 DIAGNOSIS — Z1231 Encounter for screening mammogram for malignant neoplasm of breast: Secondary | ICD-10-CM | POA: Diagnosis not present

## 2023-02-07 LAB — HM MAMMOGRAPHY

## 2023-02-14 DIAGNOSIS — R928 Other abnormal and inconclusive findings on diagnostic imaging of breast: Secondary | ICD-10-CM | POA: Diagnosis not present

## 2023-02-14 DIAGNOSIS — N6489 Other specified disorders of breast: Secondary | ICD-10-CM | POA: Diagnosis not present

## 2023-03-19 ENCOUNTER — Other Ambulatory Visit: Payer: Self-pay | Admitting: Cardiovascular Disease

## 2023-03-19 DIAGNOSIS — E7849 Other hyperlipidemia: Secondary | ICD-10-CM | POA: Diagnosis not present

## 2023-03-19 DIAGNOSIS — Z5181 Encounter for therapeutic drug level monitoring: Secondary | ICD-10-CM | POA: Diagnosis not present

## 2023-03-19 DIAGNOSIS — I10 Essential (primary) hypertension: Secondary | ICD-10-CM | POA: Diagnosis not present

## 2023-03-19 DIAGNOSIS — R634 Abnormal weight loss: Secondary | ICD-10-CM

## 2023-03-20 ENCOUNTER — Other Ambulatory Visit (HOSPITAL_BASED_OUTPATIENT_CLINIC_OR_DEPARTMENT_OTHER): Payer: Self-pay | Admitting: *Deleted

## 2023-03-20 DIAGNOSIS — R634 Abnormal weight loss: Secondary | ICD-10-CM

## 2023-03-20 LAB — COMPREHENSIVE METABOLIC PANEL
ALT: 17 IU/L (ref 0–32)
AST: 25 IU/L (ref 0–40)
Albumin/Globulin Ratio: 1.5 (ref 1.2–2.2)
Albumin: 4.4 g/dL (ref 3.9–4.9)
Alkaline Phosphatase: 78 IU/L (ref 44–121)
BUN/Creatinine Ratio: 12 (ref 9–23)
BUN: 10 mg/dL (ref 6–24)
Bilirubin Total: 0.4 mg/dL (ref 0.0–1.2)
CO2: 25 mmol/L (ref 20–29)
Calcium: 9.4 mg/dL (ref 8.7–10.2)
Chloride: 100 mmol/L (ref 96–106)
Creatinine, Ser: 0.86 mg/dL (ref 0.57–1.00)
Globulin, Total: 3 g/dL (ref 1.5–4.5)
Glucose: 100 mg/dL — ABNORMAL HIGH (ref 70–99)
Potassium: 3.9 mmol/L (ref 3.5–5.2)
Sodium: 140 mmol/L (ref 134–144)
Total Protein: 7.4 g/dL (ref 6.0–8.5)
eGFR: 82 mL/min/{1.73_m2} (ref >59)

## 2023-03-20 LAB — LIPID PANEL
Chol/HDL Ratio: 3.2 ratio (ref 0.0–4.4)
Cholesterol, Total: 187 mg/dL (ref 100–199)
HDL: 58 mg/dL (ref >39)
LDL Chol Calc (NIH): 119 mg/dL — ABNORMAL HIGH (ref 0–99)
Triglycerides: 55 mg/dL (ref 0–149)
VLDL Cholesterol Cal: 10 mg/dL (ref 5–40)

## 2023-04-02 DIAGNOSIS — R634 Abnormal weight loss: Secondary | ICD-10-CM | POA: Diagnosis not present

## 2023-04-03 ENCOUNTER — Telehealth: Payer: Self-pay | Admitting: Cardiovascular Disease

## 2023-04-03 ENCOUNTER — Other Ambulatory Visit: Payer: Self-pay

## 2023-04-03 ENCOUNTER — Observation Stay (HOSPITAL_COMMUNITY)
Admission: EM | Admit: 2023-04-03 | Discharge: 2023-04-05 | Disposition: A | Discharge status: Home / Self Care | Payer: BC Managed Care – PPO | Attending: Internal Medicine | Admitting: Internal Medicine

## 2023-04-03 DIAGNOSIS — E039 Hypothyroidism, unspecified: Secondary | ICD-10-CM | POA: Diagnosis not present

## 2023-04-03 DIAGNOSIS — D509 Iron deficiency anemia, unspecified: Principal | ICD-10-CM | POA: Diagnosis present

## 2023-04-03 DIAGNOSIS — D649 Anemia, unspecified: Secondary | ICD-10-CM | POA: Diagnosis not present

## 2023-04-03 DIAGNOSIS — I10 Essential (primary) hypertension: Secondary | ICD-10-CM | POA: Diagnosis not present

## 2023-04-03 DIAGNOSIS — R109 Unspecified abdominal pain: Secondary | ICD-10-CM | POA: Diagnosis not present

## 2023-04-03 DIAGNOSIS — E7849 Other hyperlipidemia: Secondary | ICD-10-CM | POA: Diagnosis not present

## 2023-04-03 DIAGNOSIS — Z79899 Other long term (current) drug therapy: Secondary | ICD-10-CM | POA: Insufficient documentation

## 2023-04-03 DIAGNOSIS — R5383 Other fatigue: Secondary | ICD-10-CM | POA: Diagnosis not present

## 2023-04-03 DIAGNOSIS — E785 Hyperlipidemia, unspecified: Secondary | ICD-10-CM | POA: Diagnosis present

## 2023-04-03 LAB — TSH: TSH: 1.34 u[IU]/mL (ref 0.450–4.500)

## 2023-04-03 LAB — CBC WITH DIFFERENTIAL/PLATELET
Abs Immature Granulocytes: 0 10*3/uL (ref 0.00–0.07)
Basophils Absolute: 0.1 10*3/uL (ref 0.0–0.1)
Basophils Relative: 2 %
Eosinophils Absolute: 0 10*3/uL (ref 0.0–0.5)
Eosinophils Relative: 1 %
HCT: 23.3 % — ABNORMAL LOW (ref 36.0–46.0)
Hemoglobin: 6.6 g/dL — CL (ref 12.0–15.0)
Immature Granulocytes: 0 %
Lymphocytes Relative: 54 %
Lymphs Abs: 2.6 10*3/uL (ref 0.7–4.0)
MCH: 18.8 pg — ABNORMAL LOW (ref 26.0–34.0)
MCHC: 28.3 g/dL — ABNORMAL LOW (ref 30.0–36.0)
MCV: 66.2 fL — ABNORMAL LOW (ref 80.0–100.0)
Monocytes Absolute: 0.4 10*3/uL (ref 0.1–1.0)
Monocytes Relative: 9 %
Neutro Abs: 1.6 10*3/uL — ABNORMAL LOW (ref 1.7–7.7)
Neutrophils Relative %: 34 %
Platelets: 470 10*3/uL — ABNORMAL HIGH (ref 150–400)
RBC: 3.52 MIL/uL — ABNORMAL LOW (ref 3.87–5.11)
RDW: 19.4 % — ABNORMAL HIGH (ref 11.5–15.5)
WBC: 4.7 10*3/uL (ref 4.0–10.5)
nRBC: 0 % (ref 0.0–0.2)

## 2023-04-03 LAB — COMPREHENSIVE METABOLIC PANEL
ALT: 21 U/L (ref 0–44)
AST: 30 U/L (ref 15–41)
Albumin: 3.5 g/dL (ref 3.5–5.0)
Alkaline Phosphatase: 56 U/L (ref 38–126)
Anion gap: 8 (ref 5–15)
BUN: 11 mg/dL (ref 6–20)
CO2: 26 mmol/L (ref 22–32)
Calcium: 8.7 mg/dL — ABNORMAL LOW (ref 8.9–10.3)
Chloride: 103 mmol/L (ref 98–111)
Creatinine, Ser: 0.75 mg/dL (ref 0.44–1.00)
GFR, Estimated: >60 mL/min (ref >60)
Glucose, Bld: 90 mg/dL (ref 70–99)
Potassium: 3.5 mmol/L (ref 3.5–5.1)
Sodium: 137 mmol/L (ref 135–145)
Total Bilirubin: 0.6 mg/dL (ref 0.3–1.2)
Total Protein: 6.7 g/dL (ref 6.5–8.1)

## 2023-04-03 LAB — IRON AND TIBC
Iron: 7 ug/dL — ABNORMAL LOW (ref 28–170)
Saturation Ratios: 1 % — ABNORMAL LOW (ref 10.4–31.8)
TIBC: 507 ug/dL — ABNORMAL HIGH (ref 250–450)
UIBC: 500 ug/dL

## 2023-04-03 LAB — RETICULOCYTES
Immature Retic Fract: 17.5 % — ABNORMAL HIGH (ref 2.3–15.9)
RBC.: 3.51 MIL/uL — ABNORMAL LOW (ref 3.87–5.11)
Retic Count, Absolute: 33.3 10*3/uL (ref 19.0–186.0)
Retic Ct Pct: 1 % (ref 0.4–3.1)

## 2023-04-03 LAB — HEMOGLOBIN AND HEMATOCRIT, BLOOD
HCT: 24.6 % — ABNORMAL LOW (ref 36.0–46.0)
Hemoglobin: 7.5 g/dL — ABNORMAL LOW (ref 12.0–15.0)

## 2023-04-03 LAB — FOLATE: Folate: 14.2 ng/mL (ref >5.9)

## 2023-04-03 LAB — CBC
Hematocrit: 22.7 % — ABNORMAL LOW (ref 34.0–46.6)
Hemoglobin: 6.3 g/dL — CL (ref 11.1–15.9)
MCH: 18.5 pg — ABNORMAL LOW (ref 26.6–33.0)
MCHC: 27.8 g/dL — ABNORMAL LOW (ref 31.5–35.7)
MCV: 67 fL — ABNORMAL LOW (ref 79–97)
Platelets: 522 10*3/uL — ABNORMAL HIGH (ref 150–450)
RBC: 3.4 x10E6/uL — ABNORMAL LOW (ref 3.77–5.28)
RDW: 17.2 % — ABNORMAL HIGH (ref 11.7–15.4)
WBC: 4.9 10*3/uL (ref 3.4–10.8)

## 2023-04-03 LAB — I-STAT BETA HCG BLOOD, ED (MC, WL, AP ONLY): I-stat hCG, quantitative: <5 m[IU]/mL (ref <5)

## 2023-04-03 LAB — BPAM RBC

## 2023-04-03 LAB — HEMOGLOBIN A1C
Est. average glucose Bld gHb Est-mCnc: 123 mg/dL
Hgb A1c MFr Bld: 5.9 % — ABNORMAL HIGH (ref 4.8–5.6)

## 2023-04-03 LAB — VITAMIN B12: Vitamin B-12: 1459 pg/mL — ABNORMAL HIGH (ref 180–914)

## 2023-04-03 LAB — POC OCCULT BLOOD, ED: Fecal Occult Bld: NEGATIVE

## 2023-04-03 LAB — FERRITIN: Ferritin: 3 ng/mL — ABNORMAL LOW (ref 11–307)

## 2023-04-03 LAB — PREPARE RBC (CROSSMATCH)

## 2023-04-03 MED ORDER — ACETAMINOPHEN 325 MG PO TABS: 650.0000 mg | ORAL_TABLET | Freq: Four times a day (QID) | ORAL | Status: DC | PRN

## 2023-04-03 MED ORDER — SODIUM CHLORIDE 0.9% FLUSH
3.0000 mL | Freq: Two times a day (BID) | INTRAVENOUS | Status: DC
  Administered 2023-04-03 – 2023-04-05 (×4): 3 mL via INTRAVENOUS

## 2023-04-03 MED ORDER — POLYETHYLENE GLYCOL 3350 17 G PO PACK: 17.0000 g | PACK | Freq: Every day | ORAL | Status: DC | PRN

## 2023-04-03 MED ORDER — AMLODIPINE BESYLATE 10 MG PO TABS
10.0000 mg | ORAL_TABLET | Freq: Every day | ORAL | Status: DC
  Administered 2023-04-04 – 2023-04-05 (×2): 10 mg via ORAL
  Filled 2023-04-03 (×2): qty 1

## 2023-04-03 MED ORDER — ACETAMINOPHEN 650 MG RE SUPP: 650.0000 mg | Freq: Four times a day (QID) | RECTAL | Status: DC | PRN

## 2023-04-03 MED ORDER — SODIUM CHLORIDE 0.9% IV SOLUTION: Freq: Once | INTRAVENOUS | Status: DC

## 2023-04-03 NOTE — ED Provider Notes (Signed)
Susan [name] Provider Note   CSN: [id number] Arrival date & time: 04/03/23  1131     History  Chief Complaint  Patient presents with   Abnormal Lab    Susan [name] is a 52 y.o. female.  Patient here because she had a hemoglobin of 6.3 that was drawn yesterday.  She has been having some increasing fatigue, dizziness with standing.  Saw her cardiologist yesterday who did the blood work.  She is not on any blood thinners.  She has had GI bleeds in the past.  She had gastric bypass.  She has not noticed any black or bloody stools.  She still has her menstrual cycle which is restarted over a year and a half ago after she had her IUD aching out.  She has a normal cycle every month for 4 days.  She not have any vaginal bleeding currently.  Denies any abdominal pain or chest pain.  The history is provided by the patient.       Home Medications Prior to Admission medications   Medication Sig Start Date End Date Taking? Authorizing Provider  amLODipine (NORVASC) 10 MG tablet Take 1 tablet (10 mg total) by mouth daily. 12/04/22   Chilton Si, MD  Evolocumab (REPATHA SURECLICK) 140 MG/ML SOAJ Inject 140 mg into the skin every 14 (fourteen) days. 09/30/22   Chilton Si, MD  valsartan (DIOVAN) 160 MG tablet Take 1 tablet (160 mg total) by mouth daily. 11/04/22   Chilton Si, MD      Allergies    Crestor [rosuvastatin]    Review of Systems   Review of Systems  Physical Exam Updated Vital Signs  ED Triage Vitals  Enc Vitals Group     BP 04/03/23 1136 (!) 140/91     Pulse Rate 04/03/23 1136 88     Resp 04/03/23 1136 16     Temp 04/03/23 1136 98.7 F (37.1 C)     Temp src --      SpO2 04/03/23 1136 100 %     Weight --      Height --      Head Circumference --      Peak Flow --      Pain Score 04/03/23 1138 0     Pain Loc --      Pain Edu? --      Excl. in GC? --     Physical Exam Vitals and nursing note reviewed.   Constitutional:      General: She is not in acute distress.    Appearance: She is well-developed. She is not ill-appearing.  HENT:     Head: Normocephalic and atraumatic.     Nose: Nose normal.     Mouth/Throat:     Mouth: Mucous membranes are moist.  Eyes:     Extraocular Movements: Extraocular movements intact.     Conjunctiva/sclera: Conjunctivae normal.     Pupils: Pupils are equal, round, and reactive to light.  Cardiovascular:     Rate and Rhythm: Normal rate and regular rhythm.     Pulses: Normal pulses.     Heart sounds: Normal heart sounds. No murmur heard. Pulmonary:     Effort: Pulmonary effort is normal. No respiratory distress.     Breath sounds: Normal breath sounds.  Abdominal:     Palpations: Abdomen is soft.     Tenderness: There is no abdominal tenderness.  Genitourinary:    Comments: Unable to obtain a great stool  sample from what I saw stool did appear to be brown did not see any obvious melena Musculoskeletal:        General: No swelling.     Cervical back: Normal range of motion and neck supple.  Skin:    General: Skin is warm and dry.     Capillary Refill: Capillary refill takes less than 2 seconds.  Neurological:     General: No focal deficit present.     Mental Status: She is alert.  Psychiatric:        Mood and Affect: Mood normal.     ED Results / Procedures / Treatments   Labs (all labs ordered are listed, but only abnormal results are displayed) Labs Reviewed  CBC WITH DIFFERENTIAL/PLATELET - Abnormal; Notable for the following components:      Result Value   RBC 3.52 (*)    Hemoglobin 6.6 (*)    HCT 23.3 (*)    MCV 66.2 (*)    MCH 18.8 (*)    MCHC 28.3 (*)    RDW 19.4 (*)    Platelets 470 (*)    Neutro Abs 1.6 (*)    All other components within normal limits  COMPREHENSIVE METABOLIC PANEL - Abnormal; Notable for the following components:   Calcium 8.7 (*)    All other components within normal limits  VITAMIN B12 - Abnormal;  Notable for the following components:   Vitamin B-12 1,459 (*)    All other components within normal limits  IRON AND TIBC - Abnormal; Notable for the following components:   Iron 7 (*)    TIBC 507 (*)    Saturation Ratios 1 (*)    All other components within normal limits  FERRITIN - Abnormal; Notable for the following components:   Ferritin 3 (*)    All other components within normal limits  RETICULOCYTES - Abnormal; Notable for the following components:   RBC. 3.51 (*)    Immature Retic Fract 17.5 (*)    All other components within normal limits  FOLATE  POC OCCULT BLOOD, ED  I-STAT BETA HCG BLOOD, ED ([facility], [facility], [facility] ONLY)  TYPE AND SCREEN  PREPARE RBC (CROSSMATCH)    EKG None  Radiology No results found.  Procedures .Critical Care  Performed by: Virgina Norfolk, DO Authorized by: Virgina Norfolk, DO   Critical care provider statement:    Critical care time (minutes):  35   Critical care was necessary to treat or prevent imminent or life-threatening deterioration of the following conditions: symptomatic anemia requiring transfusion.   Critical care was time spent personally by me on the following activities:  Blood draw for specimens, development of treatment plan with patient or surrogate, discussions with primary provider, evaluation of patient's response to treatment, examination of patient, obtaining history from patient or surrogate, ordering and performing treatments and interventions, ordering and review of laboratory studies, ordering and review of radiographic studies, pulse oximetry, re-evaluation of patient's condition and review of old charts   I assumed direction of critical care for this patient from another provider in my specialty: no       Medications Ordered in ED Medications  0.9 %  sodium chloride infusion (Manually program via Guardrails IV Fluids) (has no administration in time range)    ED Course/ Medical Decision Making/ A&P                              Medical Decision Making  Amount and/or Complexity of Data Reviewed Labs: ordered.  Risk Prescription drug management. Decision regarding hospitalization.   Susan [name] is here with globin of 6.3.  Drawn by cardiologist yesterday.  She has been having generalized fatigue the last week or so.  Had episode of dizziness with standing a few times.  Normal vitals.  No fever.  History of bleeding ulcers history of gastric sleeve.  She not on any blood thinners.  No heavy alcohol use or ibuprofen use.  Not a great stool sample on exam but I do not see any melena.  She still has regular menstrual cycles.  Per my review and interpretation the labs yesterday her MCV is less than 70 and could be iron deficient anemia.  Hemoglobin in our system from several years ago was well above normal.  Differential diagnosis likely acute on chronic anemia may be from vaginal bleeding, still possible that this could be a GI bleed but she does not report any melena or hematochezia.  Will recheck labs and send anemia panel.  Will transfuse her 1 unit of packed red blood cells and have her admitted to medicine for further care.  Per my review and interpretation as hemoglobin 6.6, MCV 66.  I suspect this is an iron deficiency anemia.  Overall lab work is otherwise unremarkable.  I will give her 1 unit of packed red blood cells transfusion.  Will admit to the hospitalist for further care.  This chart was dictated using voice recognition software.  Despite best efforts to proofread,  errors can occur which can change the documentation meaning.         Final Clinical Impression(s) / ED Diagnoses Final diagnoses:  Symptomatic anemia    Rx / DC Orders ED Discharge Orders     None         Virgina Norfolk, DO 04/03/23 1335

## 2023-04-03 NOTE — ED Triage Notes (Signed)
Pt reports that she was having routine bloodwork yesterday and was called this morning for a critical Hgb of 6.3. Pt endorses dizziness upon standing. Pt denies blood thinner use, no NSAIDs, no hematochezia, melena, hematemesis.

## 2023-04-03 NOTE — ED Notes (Signed)
ED TO INPATIENT HANDOFF REPORT  ED Nurse Name and Phone #: Osvaldo Shipper RN [phone]  S Name/Age/Gender Susan [name] 52 y.o. female Room/Bed: 033C/033C  Code Status   Code Status: Full Code  Home/SNF/Other Home Patient oriented to: self, place, time, and situation Is this baseline? Yes   Triage Complete: Triage complete  Chief Complaint Symptomatic anemia [D64.9]  Triage Note Pt reports that she was having routine bloodwork yesterday and was called this morning for a critical Hgb of 6.3. Pt endorses dizziness upon standing. Pt denies blood thinner use, no NSAIDs, no hematochezia, melena, hematemesis.    Allergies Allergies  Allergen Reactions   Crestor [Rosuvastatin]     Myalgias     Level of Care/Admitting Diagnosis ED Disposition     ED Disposition  Admit   Condition  --   Comment  Hospital Area: [facility] [[id number]]  Level of Care: Telemetry Medical [104]  May place patient in observation at [facility] or [facility] if equivalent level of care is available:: No  Covid Evaluation: Asymptomatic - no recent exposure (last 10 days) testing not required  Diagnosis: Symptomatic anemia [4540981]  Admitting Physician: Synetta Fail [[id number]]  Attending Physician: Synetta Fail [[id number]]          B Medical/Surgery History Past Medical History:  Diagnosis Date   Anemia 2016   with bleeding ulcer   Arthritis    oa hands   Constipation    Dizziness    GERD (gastroesophageal reflux disease)    no current meds   GI bleeding    Glaucoma    Headache    headaches and migraines   History of bleeding ulcers    History of stomach ulcers    Hypertension    Hypothyroidism    Insomnia    Iron deficiency    Irregular periods    Osteoarthritis of both hands    PCOS (polycystic ovarian syndrome)    Pre-diabetes    Syncope    Vitamin D deficiency    Past Surgical History:  Procedure Laterality Date   BILATERAL SALPINGECTOMY  Bilateral 03/22/2013   Procedure: BILATERAL SALPINGECTOMY;  Surgeon: Serita Kyle, MD;  Location: [facility];  Service: Gynecology;  Laterality: Bilateral;   CARPAL TUNNEL RELEASE Right 11/15/2013   Procedure: RIGHT CARPAL TUNNEL RELEASE;  Surgeon: Nicki Reaper, MD;  Location: [facility];  Service: Orthopedics;  Laterality: Right;   COSMETIC SURGERY  01/2015   tummy tuck    ESOPHAGOGASTRODUODENOSCOPY N/A 10/04/2015   Procedure: ESOPHAGOGASTRODUODENOSCOPY (EGD);  Surgeon: Ruffin Frederick, MD;  Location: [facility];  Service: Gastroenterology;  Laterality: N/A;   GANGLION CYST EXCISION Right 11/15/2013   Procedure: EXCISION CYST RIGHT WRIST;  Surgeon: Nicki Reaper, MD;  Location: [facility];  Service: Orthopedics;  Laterality: Right;   LAPAROSCOPIC GASTRIC BAND REMOVAL WITH LAPAROSCOPIC GASTRIC SLEEVE RESECTION N/A 09/23/2017   Procedure: LAPAROSCOPIC GASTRIC BAND REMOVAL WITH LAPAROSCOPIC GASTRIC SLEEVE RESECTION;  Surgeon: Luretha Murphy, MD;  Location: [facility];  Service: General;  Laterality: N/A;   LAPAROSCOPIC GASTRIC BANDING  2010   LAPAROSCOPY N/A 03/22/2013   Procedure: LAPAROSCOPY OPERATIVE;  Surgeon: Serita Kyle, MD;  Location: [facility];  Service: Gynecology;  Laterality: N/A;   MOUTH SURGERY  1991   SALPINGECTOMY  03/22/2013     A IV Location/Drains/Wounds Patient Lines/Drains/Airways Status     Active Line/Drains/Airways     Name Placement date Placement time Site Days   Peripheral  IV 04/03/23 18 G Anterior;Left;Proximal Forearm 04/03/23  1153  Forearm  less than 1   NG/OG Tube Orogastric 03/22/13  1358  --  3664   Airway 7.5 mm 09/23/17  1352  -- 2018   Incision 03/22/13 Abdomen 03/22/13  1432  -- 3664   Incision 03/22/13 Perineum 03/22/13  1432  -- 3664   Incision 11/15/13 Hand Right 11/15/13  1459  -- 3426   Incision (Closed) 09/23/17 Abdomen 09/23/17  1623  -- 2018   Incision - 3 Ports Abdomen 1: Mid 2: Right;Lower 3:  Left;Lower 03/22/13  1415  -- 3664   Incision - 6 Ports Abdomen 1: Anterior;Upper 2: Right;Lateral 3: Umbilicus 4: Anterior;Upper 5: Left;Medial 6: Left;Lateral 09/23/17  1624  -- 2018            Intake/Output Last 24 hours No intake or output data in the 24 hours ending 04/03/23 1400  Labs/Imaging Results for orders placed or performed during the hospital encounter of 04/03/23 (from the past 48 hour(s))  Folate     Status: None   Collection Time: 04/03/23 11:42 AM  Result Value Ref Range   Folate 14.2 >5.9 ng/mL    Comment: Performed at [facility], [address], [place], [place] [address]  Reticulocytes     Status: Abnormal   Collection Time: 04/03/23 11:42 AM  Result Value Ref Range   Retic Ct Pct 1.0 0.4 - 3.1 %   RBC. 3.51 (L) 3.87 - 5.11 MIL/uL   Retic Count, Absolute 33.3 19.0 - 186.0 K/uL   Immature Retic Fract 17.5 (H) 2.3 - 15.9 %    Comment: Performed at [facility], [address], [place], [place] [address]  CBC with Differential     Status: Abnormal   Collection Time: 04/03/23 11:59 AM  Result Value Ref Range   WBC 4.7 4.0 - 10.5 K/uL   RBC 3.52 (L) 3.87 - 5.11 MIL/uL   Hemoglobin 6.6 (LL) 12.0 - 15.0 g/dL    Comment: REPEATED TO VERIFY Reticulocyte Hemoglobin testing may be clinically indicated, consider ordering this additional test [id number] THIS CRITICAL RESULT HAS VERIFIED AND BEEN CALLED TO C. Sabria Florido, RN BY DAISY BLU ON 05 09 2024 AT 1244, AND HAS BEEN READ BACK.     HCT 23.3 (L) 36.0 - 46.0 %   MCV 66.2 (L) 80.0 - 100.0 fL   MCH 18.8 (L) 26.0 - 34.0 pg   MCHC 28.3 (L) 30.0 - 36.0 g/dL   RDW 78.2 (H) 95.6 - 21.3 %   Platelets 470 (H) 150 - 400 K/uL    Comment: REPEATED TO VERIFY   nRBC 0.0 0.0 - 0.2 %   Neutrophils Relative % 34 %   Neutro Abs 1.6 (L) 1.7 - 7.7 K/uL   Lymphocytes Relative 54 %   Lymphs Abs 2.6 0.7 - 4.0 K/uL   Monocytes Relative 9 %   Monocytes Absolute 0.4 0.1 - 1.0 K/uL   Eosinophils Relative 1 %    Eosinophils Absolute 0.0 0.0 - 0.5 K/uL   Basophils Relative 2 %   Basophils Absolute 0.1 0.0 - 0.1 K/uL   Immature Granulocytes 0 %   Abs Immature Granulocytes 0.00 0.00 - 0.07 K/uL    Comment: Performed at [facility], [address], [place], [place] [address]  Comprehensive metabolic panel     Status: Abnormal   Collection Time: 04/03/23 11:59 AM  Result Value Ref Range   Sodium 137 135 - 145  mmol/L   Potassium 3.5 3.5 - 5.1 mmol/L   Chloride 103 98 - 111 mmol/L   CO2 26 22 - 32 mmol/L   Glucose, Bld 90 70 - 99 mg/dL    Comment: Glucose reference range applies only to samples taken after fasting for at least 8 hours.   BUN 11 6 - 20 mg/dL   Creatinine, Ser 4.09 0.44 - 1.00 mg/dL   Calcium 8.7 (L) 8.9 - 10.3 mg/dL   Total Protein 6.7 6.5 - 8.1 g/dL   Albumin 3.5 3.5 - 5.0 g/dL   AST 30 15 - 41 U/L   ALT 21 0 - 44 U/L   Alkaline Phosphatase 56 38 - 126 U/L   Total Bilirubin 0.6 0.3 - 1.2 mg/dL   GFR, Estimated >81 >19 mL/min    Comment: (NOTE) Calculated using the CKD-EPI Creatinine Equation (2021)    Anion gap 8 5 - 15    Comment: Performed at [facility], [address], [place], [place] [address]  Vitamin B12     Status: Abnormal   Collection Time: 04/03/23 11:59 AM  Result Value Ref Range   Vitamin B-12 1,459 (H) 180 - 914 pg/mL    Comment: (NOTE) This assay is not validated for testing neonatal or myeloproliferative syndrome specimens for Vitamin B12 levels. Performed at [facility], [address], [place], [place] [address]   Iron and TIBC     Status: Abnormal   Collection Time: 04/03/23 11:59 AM  Result Value Ref Range   Iron 7 (L) 28 - 170 ug/dL   TIBC 308 (H) 657 - 846 ug/dL   Saturation Ratios 1 (L) 10.4 - 31.8 %   UIBC 500 ug/dL    Comment: Performed at [facility], [address], [place], [place] [address]  Ferritin     Status: Abnormal   Collection Time: 04/03/23 11:59 AM  Result Value Ref Range   Ferritin 3 (L) 11 - 307  ng/mL    Comment: Performed at [facility], [address] [place], [place] [address]  I-Stat beta hCG blood, ED ([facility], [facility], [facility] only)     Status: None   Collection Time: 04/03/23 12:02 PM  Result Value Ref Range   I-stat hCG, quantitative <5.0 <5 mIU/mL   Comment 3            Comment:   GEST. AGE      CONC.  (mIU/mL)   <=1 WEEK        5 - 50     2 WEEKS       50 - 500     3 WEEKS       100 - 10,000     4 WEEKS     1,000 - 30,000        FEMALE AND NON-PREGNANT FEMALE:     LESS THAN 5 mIU/mL   POC occult blood, ED     Status: None   Collection Time: 04/03/23 12:03 PM  Result Value Ref Range   Fecal Occult Bld NEGATIVE NEGATIVE  Type and screen [facility]     Status: None (Preliminary result)   Collection Time: 04/03/23  1:33 PM  Result Value Ref Range   ABO/RH(D) PENDING    Antibody Screen PENDING    Sample Expiration      04/06/2023,2359 Performed at [facility], [address], [place], [place] [address]   Prepare RBC (crossmatch)  Status: None   Collection Time: 04/03/23  1:33 PM  Result Value Ref Range   Order Confirmation      ORDER PROCESSED BY BLOOD BANK Performed at [facility], [address], [place], [place] [address]    No results found.  Pending Labs Unresulted Labs (From admission, onward)     Start     Ordered   04/04/23 0500  Basic metabolic panel  Tomorrow morning,   R        04/03/23 1337   04/04/23 0500  CBC  Tomorrow morning,   R        04/03/23 1337   04/03/23 1333  HIV Antibody (routine testing w rflx)  (HIV Antibody (Routine testing w reflex) panel)  Once,   R        04/03/23 1337            Vitals/Pain Today's Vitals   04/03/23 1136 04/03/23 1138  BP: (!) 140/91   Pulse: 88   Resp: 16   Temp: 98.7 F (37.1 C)   SpO2: 100%   PainSc:  0-No pain    Isolation Precautions No active isolations  Medications Medications  0.9 %  sodium chloride infusion (Manually program via Guardrails IV  Fluids) (has no administration in time range)  amLODipine (NORVASC) tablet 10 mg (has no administration in time range)  sodium chloride flush (NS) 0.9 % injection 3 mL (has no administration in time range)  acetaminophen (TYLENOL) tablet 650 mg (has no administration in time range)    Or  acetaminophen (TYLENOL) suppository 650 mg (has no administration in time range)  polyethylene glycol (MIRALAX / GLYCOLAX) packet 17 g (has no administration in time range)    Mobility walks     Focused Assessments GI    R Recommendations: See Admitting Provider Note  Report given to:   Additional Notes:

## 2023-04-03 NOTE — ED Notes (Signed)
Transfer of care report given to [facility], Charity fundraiser.

## 2023-04-03 NOTE — Telephone Encounter (Signed)
Mary form [facility] is calling with critical results.

## 2023-04-03 NOTE — Telephone Encounter (Addendum)
Andy Gauss RN spoke with [facility] for critical results, Hgb 6.3 Called [facility], other labs not completed Faxed and will have them scanned in under media until all labs completed   Will forward to Dr Duke Salvia for review

## 2023-04-03 NOTE — Progress Notes (Signed)
Hgb came back at 7.5 after 1 unit PRBCs, Dr. Julian Reil notified and aware.

## 2023-04-03 NOTE — Telephone Encounter (Signed)
Received notification that the patient's hgb is 6.3.  She reported feeling weak and tired.  Unintentional weight loss and near syncopal episode, so labs were ordered. Called patient and left voicemail.  Will continue to call and reach out to her family if necessary.  Susan [name] C. Duke Salvia, MD, [facility] 04/03/2023 9:30 AM

## 2023-04-03 NOTE — H&P (Signed)
History and Physical   Susan [name] ZOX:[id number] DOB: [birth year] DOA: 04/03/2023  PCP: Dorothyann Peng, MD   Patient coming from: Home  Chief Complaint: Abnormal lab value  HPI: Susan [name] is a 52 y.o. female with medical history significant of anemia, GI bleed, gastric sleeve, hypertension, hyperlipidemia, glaucoma, hypothyroidism, PCOS presenting with abnormal lab value.  Patient had routine labs drawn yesterday by cardiology.  Hemoglobin came back at 6.3 today and patient was notified and sent to the ED for further evaluation.  Patient does report mild increased fatigue and lightheadedness on standing for the past.  Does have history of prior GI bleeding but denies any recent bloody or dark stools.  Reports a year ago her IUD was taken out and she has had normal menses that last about 4 days every month.  Has reported about a 7 pound unintentional weight loss.  Last colonoscopy 3 years ago.  She denies fevers, chills, chest pain, shortness of breath, abdominal pain, constipation, diarrhea, nausea, vomiting.  ED Course: Vital signs in the ED notable for blood pressure in the 140 systolic.  Lab workup clued CMP with calcium 8.7.  CBC showed hemoglobin of 6.6 with MCV less than 70, platelets 470.  FOBT negative.  Patient typed and screened in the ED.  Reticulocyte count inappropriately normal.  Folate pending.  B12 normal.  Iron 7, ferritin 3.  1 unit ordered for transfusion.  Review of Systems: As per HPI otherwise all other systems reviewed and are negative.  Past Medical History:  Diagnosis Date   Anemia 2016   with bleeding ulcer   Arthritis    oa hands   Constipation    Dizziness    GERD (gastroesophageal reflux disease)    no current meds   GI bleeding    Glaucoma    Headache    headaches and migraines   History of bleeding ulcers    History of stomach ulcers    Hypertension    Hypothyroidism    Insomnia    Iron deficiency    Irregular periods    Osteoarthritis  of both hands    PCOS (polycystic ovarian syndrome)    Pre-diabetes    Syncope    Vitamin D deficiency     Past Surgical History:  Procedure Laterality Date   BILATERAL SALPINGECTOMY Bilateral 03/22/2013   Procedure: BILATERAL SALPINGECTOMY;  Surgeon: Serita Kyle, MD;  Location: [facility];  Service: Gynecology;  Laterality: Bilateral;   CARPAL TUNNEL RELEASE Right 11/15/2013   Procedure: RIGHT CARPAL TUNNEL RELEASE;  Surgeon: Nicki Reaper, MD;  Location: [facility];  Service: Orthopedics;  Laterality: Right;   COSMETIC SURGERY  01/2015   tummy tuck    ESOPHAGOGASTRODUODENOSCOPY N/A 10/04/2015   Procedure: ESOPHAGOGASTRODUODENOSCOPY (EGD);  Surgeon: Ruffin Frederick, MD;  Location: [facility];  Service: Gastroenterology;  Laterality: N/A;   GANGLION CYST EXCISION Right 11/15/2013   Procedure: EXCISION CYST RIGHT WRIST;  Surgeon: Nicki Reaper, MD;  Location: [facility];  Service: Orthopedics;  Laterality: Right;   LAPAROSCOPIC GASTRIC BAND REMOVAL WITH LAPAROSCOPIC GASTRIC SLEEVE RESECTION N/A 09/23/2017   Procedure: LAPAROSCOPIC GASTRIC BAND REMOVAL WITH LAPAROSCOPIC GASTRIC SLEEVE RESECTION;  Surgeon: Luretha Murphy, MD;  [facility];  Service: General;  Laterality: N/A;   LAPAROSCOPIC GASTRIC BANDING  2010   LAPAROSCOPY N/A 03/22/2013   Procedure: LAPAROSCOPY OPERATIVE;  Surgeon: Serita Kyle, MD;  [facility];  Service: Gynecology;  Laterality: N/A;   MOUTH SURGERY  1991  SALPINGECTOMY  03/22/2013    Social History  reports that she has never smoked. She has never used smokeless tobacco. She reports current alcohol use of about 1.0 standard drink of alcohol per week. She reports that she does not use drugs.  Allergies  Allergen Reactions   Crestor [Rosuvastatin]     Myalgias     Family History  Problem Relation Age of Onset   Hypertension Mother    Cancer Mother        pancreatic   Stroke Mother    Diabetes  Mother    Diabetes gravidarum Mother    High blood pressure Mother    Heart disease Mother    Obesity Mother    Hypertension Father    Ulcers Father    Diabetes Father    Thyroid disease Father    Hypertension Brother    CAD Maternal Grandmother    Kidney cancer Maternal Grandmother    Diabetes Paternal Grandmother    Heart attack Paternal Grandfather   Reviewed on admission  Prior to Admission medications   Medication Sig Start Date End Date Taking? Authorizing Provider  amLODipine (NORVASC) 10 MG tablet Take 1 tablet (10 mg total) by mouth daily. 12/04/22   Chilton Si, MD  Evolocumab (REPATHA SURECLICK) 140 MG/ML SOAJ Inject 140 mg into the skin every 14 (fourteen) days. 09/30/22   Chilton Si, MD  valsartan (DIOVAN) 160 MG tablet Take 1 tablet (160 mg total) by mouth daily. 11/04/22   Chilton Si, MD    Physical Exam: Vitals:   04/03/23 1136  BP: (!) 140/91  Pulse: 88  Resp: 16  Temp: 98.7 F (37.1 C)  SpO2: 100%    Physical Exam Constitutional:      General: She is not in acute distress.    Appearance: Normal appearance.  HENT:     Head: Normocephalic and atraumatic.     Mouth/Throat:     Mouth: Mucous membranes are moist.     Pharynx: Oropharynx is clear.  Eyes:     Extraocular Movements: Extraocular movements intact.     Pupils: Pupils are equal, round, and reactive to light.  Cardiovascular:     Rate and Rhythm: Normal rate and regular rhythm.     Pulses: Normal pulses.     Heart sounds: Normal heart sounds.  Pulmonary:     Effort: Pulmonary effort is normal. No respiratory distress.     Breath sounds: Normal breath sounds.  Abdominal:     General: Bowel sounds are normal. There is no distension.     Palpations: Abdomen is soft.     Tenderness: There is no abdominal tenderness.  Musculoskeletal:        General: No swelling or deformity.  Skin:    General: Skin is warm and dry.  Neurological:     General: No focal deficit present.      Mental Status: Mental status is at baseline.    Labs on Admission: I have personally reviewed following labs and imaging studies  CBC: Recent Labs  Lab 04/02/23 1603 04/03/23 1159  WBC 4.9 4.7  NEUTROABS  --  1.6*  HGB 6.3* 6.6*  HCT 22.7* 23.3*  MCV 67* 66.2*  PLT 522* 470*    Basic Metabolic Panel: Recent Labs  Lab 04/03/23 1159  NA 137  K 3.5  CL 103  CO2 26  GLUCOSE 90  BUN 11  CREATININE 0.75  CALCIUM 8.7*    GFR: CrCl cannot be calculated (Unknown ideal weight.).  Liver Function Tests: Recent Labs  Lab 04/03/23 1159  AST 30  ALT 21  ALKPHOS 56  BILITOT 0.6  PROT 6.7  ALBUMIN 3.5    Urine analysis:    Component Value Date/Time   COLORURINE YELLOW 10/03/2015 1513   APPEARANCEUR CLOUDY (A) 10/03/2015 1513   LABSPEC 1.025 10/03/2015 1513   PHURINE 6.0 10/03/2015 1513   GLUCOSEU NEGATIVE 10/03/2015 1513   HGBUR TRACE (A) 10/03/2015 1513   BILIRUBINUR negative 03/18/2019 1348   KETONESUR 15 (A) 10/03/2015 1513   PROTEINUR Negative 03/18/2019 1348   PROTEINUR 30 (A) 10/03/2015 1513   UROBILINOGEN 0.2 03/18/2019 1348   UROBILINOGEN 1.0 10/03/2015 1513   NITRITE negative 03/18/2019 1348   NITRITE NEGATIVE 10/03/2015 1513   LEUKOCYTESUR Negative 03/18/2019 1348    Radiological Exams on Admission: No results found.  EKG: Not performed this admission.  Assessment/Plan Principal Problem:   Symptomatic anemia Active Problems:   Familial hyperlipidemia   Hypertension   Symptomatic anemia > Routine labs drawn outpatient and found to have hemoglobin 6.3.  Sent to the ED and hemoglobin confirmed 6.6. > Reports recent fatigue and lightheadedness. > History of prior anemia and GI bleeding.  Currently FOBT is negative and does not report any recent bloody or dark stools.  Colonoscopy 3 years ago showed diverticula but otherwise normal. > Anemia is microcytic and iron is 7 as well as ferritin of 3.  This is likely etiology.  No recent hemoglobin  in the last couple years to compare, so possibly has had a slow downtrend. > Has had regular menses since IUD removal about a year ago.  Not significantly heavy bleeding and last for about 4 days. > 1 unit ordered for transfusion in the ED. - Monitor on telemetry - Continue with 1 unit PRBC transfusion - Trend hemoglobin - Consider iron transfusion  Hypertension - Continue home amlodipine  Hyperlipidemia - On Repatha outpatient  DVT prophylaxis: SCDs for now Code Status:   Full Family Communication:  None on admission  Disposition Plan:   Patient is from:  Home  Anticipated DC to:  Home  Anticipated DC date:  1 to 2 days  Anticipated DC barriers: None  Consults called:  None Admission status:  Observation, telemetry  Severity of Illness: The appropriate patient status for this patient is OBSERVATION. Observation status is judged to be reasonable and necessary in order to provide the required intensity of service to ensure the patient's safety. The patient's presenting symptoms, physical exam findings, and initial radiographic and laboratory data in the context of their medical condition is felt to place them at decreased risk for further clinical deterioration. Furthermore, it is anticipated that the patient will be medically stable for discharge from the hospital within 2 midnights of admission.    Synetta Fail MD [facility]  How to contact the [facility] Attending or Consulting provider 7A - 7P or covering provider during after hours 7P -7A, for this patient?   Check the care team in [facility] and look for a) attending/consulting [facility] provider listed and b) the [facility] team listed Log into [email or web address] and use [facility]'s universal password to access. If you do not have the password, please contact the hospital operator. Locate the [facility] provider you are looking for under [facility] and page to a number that you can be directly reached. If you still have difficulty  reaching the provider, please page the [facility] (Director on Call) for the Hospitalists listed on amion for assistance.  04/03/2023,  1:38 PM

## 2023-04-04 ENCOUNTER — Observation Stay (HOSPITAL_COMMUNITY): Payer: BC Managed Care – PPO

## 2023-04-04 ENCOUNTER — Telehealth: Payer: Self-pay

## 2023-04-04 DIAGNOSIS — D509 Iron deficiency anemia, unspecified: Secondary | ICD-10-CM | POA: Diagnosis not present

## 2023-04-04 DIAGNOSIS — D649 Anemia, unspecified: Secondary | ICD-10-CM | POA: Diagnosis not present

## 2023-04-04 DIAGNOSIS — I10 Essential (primary) hypertension: Secondary | ICD-10-CM | POA: Diagnosis not present

## 2023-04-04 DIAGNOSIS — E785 Hyperlipidemia, unspecified: Secondary | ICD-10-CM

## 2023-04-04 DIAGNOSIS — R634 Abnormal weight loss: Secondary | ICD-10-CM | POA: Diagnosis not present

## 2023-04-04 LAB — TYPE AND SCREEN
ABO/RH(D): A POS
Antibody Screen: NEGATIVE
Unit division: 0

## 2023-04-04 LAB — CBC
HCT: 26 % — ABNORMAL LOW (ref 36.0–46.0)
Hemoglobin: 7.6 g/dL — ABNORMAL LOW (ref 12.0–15.0)
MCH: 20.1 pg — ABNORMAL LOW (ref 26.0–34.0)
MCHC: 29.2 g/dL — ABNORMAL LOW (ref 30.0–36.0)
MCV: 68.6 fL — ABNORMAL LOW (ref 80.0–100.0)
Platelets: 419 10*3/uL — ABNORMAL HIGH (ref 150–400)
RBC: 3.79 MIL/uL — ABNORMAL LOW (ref 3.87–5.11)
RDW: 21.6 % — ABNORMAL HIGH (ref 11.5–15.5)
WBC: 5.8 10*3/uL (ref 4.0–10.5)
nRBC: 0 % (ref 0.0–0.2)

## 2023-04-04 LAB — CBC WITH DIFFERENTIAL/PLATELET
Abs Immature Granulocytes: 0.01 10*3/uL (ref 0.00–0.07)
Basophils Absolute: 0.1 10*3/uL (ref 0.0–0.1)
Basophils Relative: 1 %
Eosinophils Absolute: 0.1 10*3/uL (ref 0.0–0.5)
Eosinophils Relative: 1 %
HCT: 27 % — ABNORMAL LOW (ref 36.0–46.0)
Hemoglobin: 7.9 g/dL — ABNORMAL LOW (ref 12.0–15.0)
Immature Granulocytes: 0 %
Lymphocytes Relative: 42 %
Lymphs Abs: 2.2 10*3/uL (ref 0.7–4.0)
MCH: 20.1 pg — ABNORMAL LOW (ref 26.0–34.0)
MCHC: 29.3 g/dL — ABNORMAL LOW (ref 30.0–36.0)
MCV: 68.5 fL — ABNORMAL LOW (ref 80.0–100.0)
Monocytes Absolute: 0.6 10*3/uL (ref 0.1–1.0)
Monocytes Relative: 11 %
Neutro Abs: 2.3 10*3/uL (ref 1.7–7.7)
Neutrophils Relative %: 45 %
Platelets: 429 10*3/uL — ABNORMAL HIGH (ref 150–400)
RBC: 3.94 MIL/uL (ref 3.87–5.11)
RDW: 21.6 % — ABNORMAL HIGH (ref 11.5–15.5)
WBC: 5.2 10*3/uL (ref 4.0–10.5)
nRBC: 0 % (ref 0.0–0.2)

## 2023-04-04 LAB — BASIC METABOLIC PANEL
Anion gap: 7 (ref 5–15)
BUN: 8 mg/dL (ref 6–20)
CO2: 25 mmol/L (ref 22–32)
Calcium: 8.7 mg/dL — ABNORMAL LOW (ref 8.9–10.3)
Chloride: 104 mmol/L (ref 98–111)
Creatinine, Ser: 0.79 mg/dL (ref 0.44–1.00)
GFR, Estimated: >60 mL/min (ref >60)
Glucose, Bld: 90 mg/dL (ref 70–99)
Potassium: 3.7 mmol/L (ref 3.5–5.1)
Sodium: 136 mmol/L (ref 135–145)

## 2023-04-04 LAB — BPAM RBC
Blood Product Expiration Date: 202406052359
ISSUE DATE / TIME: 202405091519
Unit Type and Rh: 6200

## 2023-04-04 LAB — TECHNOLOGIST SMEAR REVIEW

## 2023-04-04 LAB — LACTATE DEHYDROGENASE: LDH: 112 U/L (ref 98–192)

## 2023-04-04 LAB — HIV ANTIBODY (ROUTINE TESTING W REFLEX): HIV Screen 4th Generation wRfx: NONREACTIVE

## 2023-04-04 MED ORDER — IOHEXOL 9 MG/ML PO SOLN
500.0000 mL | ORAL | Status: AC
  Administered 2023-04-04 (×2): 500 mL via ORAL

## 2023-04-04 MED ORDER — SODIUM CHLORIDE 0.9 % IV SOLN
500.0000 mg | Freq: Once | INTRAVENOUS | Status: AC
  Administered 2023-04-05: 500 mg via INTRAVENOUS
  Filled 2023-04-04 (×2): qty 10

## 2023-04-04 MED ORDER — SODIUM CHLORIDE 0.9 % IV SOLN
25.0000 mg | Freq: Once | INTRAVENOUS | Status: AC
  Administered 2023-04-04: 25 mg via INTRAVENOUS
  Filled 2023-04-04: qty 0.5

## 2023-04-04 MED ORDER — IOHEXOL 350 MG/ML SOLN
75.0000 mL | Freq: Once | INTRAVENOUS | Status: AC | PRN
  Administered 2023-04-04: 75 mL via INTRAVENOUS

## 2023-04-04 MED ORDER — SODIUM CHLORIDE 0.9 % IV SOLN
500.0000 mg | Freq: Once | INTRAVENOUS | Status: DC
  Filled 2023-04-04: qty 10

## 2023-04-04 MED ORDER — VITAMIN C 500 MG PO TABS
500.0000 mg | ORAL_TABLET | Freq: Every day | ORAL | Status: DC
  Administered 2023-04-04 – 2023-04-05 (×2): 500 mg via ORAL
  Filled 2023-04-04 (×2): qty 1

## 2023-04-04 MED ORDER — FERROUS SULFATE 325 (65 FE) MG PO TABS
325.0000 mg | ORAL_TABLET | Freq: Two times a day (BID) | ORAL | Status: DC
  Administered 2023-04-05: 325 mg via ORAL
  Filled 2023-04-04: qty 1

## 2023-04-04 NOTE — Progress Notes (Addendum)
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 female with medical history significant of anemia, GI bleed, gastric sleeve, hypertension, hyperlipidemia, glaucoma, hypothyroidism, PCOS presenting with abnormal lab value.   Patient had routine labs drawn yesterday by cardiology.  Hemoglobin came back at 6.3 today and patient was notified and sent to the ED for further evaluation.  Patient does report mild increased fatigue and lightheadedness on standing for the past.  Does have history of prior GI bleeding but denies any recent bloody or dark stools.  Reports a year ago her IUD was taken out and she has had normal menses that last about 4 days every month.   Has reported about a 7 pound unintentional weight loss.  Last colonoscopy 3 years ago.  She denies fevers, chills, chest pain, shortness of breath, abdominal pain, constipation, diarrhea, nausea, vomiting.   ED Course: Vital signs in the ED notable for blood pressure in the 140 systolic.  Lab workup clued CMP with calcium 8.7.  CBC showed hemoglobin of 6.6 with MCV less than 70, platelets 470.  FOBT negative.  Patient typed and screened in the ED.  Reticulocyte count inappropriately normal.  Folate pending.  B12 normal.  Iron 7, ferritin 3.  1 unit ordered for transfusion.   Obj: A/O x 4, since 2017 patient has had a gastric sleeve placed as a revision to her Lap-Band.  Hx GI bleed secondary to gastric ulcer (patient states at the time was drinking and using NSAIDs).  No longer using NSAIDs or EtOH.  Over the last several months has felt fatigued,but did not go in for checkup attributed to her long hours at work. positive significant unwanted weight loss,   Unknown baseline HgB.  Currently states has some waxing and waning abdominal pain treated with Prilosec.  Also states having pica (eats baking soda).  Has not noticed any melena/BRBPR, hematuria.  States her periods  are regular with negative heavy bleeding.  Last seen by Dr. Ned Grace, GI August 2017 for follow-up of the bleeding ulcer.   Objective: VITAL SIGNS: Temp: 99.2 F (37.3 C) (05/10 1618) Temp Source: Oral (05/10 0842) BP: 116/81 (05/10 1618) Pulse Rate: 77 (05/10 1618)   VENTILATOR SETTINGS: **  Procedures/Significant Events: 5/10 CT chest abdomen pelvis W0 contrast Evaluation for malignancy of solid organ and lymph nodes can be significantly limited without the advantage of IV contrast. No obvious solid organ mass lesion or lymph node enlargement.   Clear lungs.   Surgical changes from gastric bypass. Lobular uterus. Possible multiple fibroids. This could be confirmed with ultrasound when clinically appropriate to exclude secondary pathology.  Consultants:     Cultures   Antimicrobials:   Intake/Output Summary (Last 24 hours) at 04/04/2023 2000 Last data filed at 04/03/2023 2327 Gross per 24 hour  Intake 123 ml  Output --  Net 123 ml     Exam: Physical Exam:  General: A/O x 4, No acute respiratory distress, thin Eyes: negative scleral hemorrhage, negative anisocoria, negative icterus ENT: Negative Runny nose, negative gingival bleeding, Neck:  Negative scars, masses, torticollis, lymphadenopathy, JVD Lungs: Clear to auscultation bilaterally without wheezes or crackles Cardiovascular: Regular rate and rhythm without murmur gallop or rub normal S1 and S2 Abdomen: negative abdominal pain, nondistended, positive soft, bowel sounds, no rebound, no ascites, no appreciable mass, appropriate scars on abdomen for Lap-Band surgery with sleeve revision Extremities: No significant cyanosis, clubbing, or edema bilateral lower  extremities Skin: Negative rashes, lesions, ulcers Psychiatric:  Negative depression, negative anxiety, negative fatigue, negative mania  Central nervous system:  Cranial nerves II through XII intact, tongue/uvula midline, all extremities  muscle strength 5/5, sensation intact throughout, negative dysarthria, negative expressive aphasia, negative receptive aphasia.   .   DVT prophylaxis: SCD Code Status: Full code Family Communication: 5/10 discussed case with Dr. Creta Levin (friend), RN Shaune Pascal who at bedside for discussion of plan of care Status is: Inpatient    Dispo: The patient is from: Home              Anticipated d/c is to: Home              Anticipated d/c date is: 2 days              Patient currently is not medically stable to d/c.      Assessment & Plan: Covid vaccination;   Principal Problem:   Symptomatic anemia Active Problems:   HLD (hyperlipidemia)   Essential hypertension   Iron deficiency anemia   Symptomatic iron deficiency Anemia > Routine labs drawn outpatient and found to have hemoglobin 6.3.  Sent to the ED and hemoglobin confirmed 6.6. > Reports recent fatigue and lightheadedness. > History of prior anemia and GI bleeding.  Currently FOBT is negative and does not report any recent bloody or dark stools.  Colonoscopy 3 years ago showed diverticula but otherwise normal. > Anemia is microcytic and iron is 7 as well as ferritin of 3.  This is likely etiology.  No recent hemoglobin in the last couple years to compare, so possibly has had a slow downtrend. > Has had regular menses since IUD removal about a year ago.  Not significantly heavy bleeding and last for about 4 days. -Transfuse 1 unit PRBC  -5/9 Anemia panel consistent with  - Consider iron transfusion -5/10 CT chest, abdomen pelvis R/O neoplasm - 5/10 anemia panel, haptoglobin, LDH, blood smear -5/10 discussed at length with patient will conduct neoplasm workup. -5/10 iron dextran IV 500 mg x 1 -5/10 vitamin C 500 mg daily - 5/10 iron sulfate 325 mg BID starting on 5/11 -5/10 per radiology will reorder CT abdomen pelvis with contrast see above.   Essential hypertension -Amlodipine 10 mg daily  HLD - On Repatha  outpatient - Lipid panel pending          Mobility Assessment (last 72 hours)     Mobility Assessment     Row Name 04/03/23 1626           Does patient have an order for bedrest or is patient medically unstable No - Continue assessment       What is the highest level of mobility based on the progressive mobility assessment? Level 6 (Walks independently in room and hall) - Balance while walking in room without assist - Complete                     Time: 50 minutes         Care during the described time interval was provided by me .  I have reviewed this patient's available data, including medical history, events of note, physical examination, and all test results as part of my evaluation.

## 2023-04-04 NOTE — Progress Notes (Signed)
Patient states she is leaving and has removed her IV.  Patient agrees to sign AMA form.  MD informed via secure chat

## 2023-04-04 NOTE — TOC Progression Note (Signed)
Transition of Care [facility]) - Progression Note    Patient Details  Name: Casundra Waterford MRN: [id number] Date of Birth: 1971/11/09  Transition of Care [facility]) CM/SW Contact  Janae Bridgeman, RN Phone Number: 04/04/2023, 9:42 AM  Clinical Narrative:     Transition of Care [facility]) Screening Note   Patient Details  Name: Clarisa Sastre Date of Birth: 19-Mar-1971   Transition of Care [facility]) CM/SW Contact:    Janae Bridgeman, RN Phone Number: 04/04/2023, 9:43 AM    Transition of Care Department [facility]) has reviewed patient and no TOC needs have been identified at this time.   We will continue to monitor patient advancement through interdisciplinary progression rounds. If new patient transition needs arise, please place a TOC consult.          Expected Discharge Plan and Services                                               Social Determinants of Health (SDOH) Interventions SDOH Screenings   Food Insecurity: No Food Insecurity (04/03/2023)  Housing: Low Risk  (04/03/2023)  Transportation Needs: No Transportation Needs (04/03/2023)  Utilities: Not At Risk (04/03/2023)  Depression (PHQ2-9): Low Risk  (09/09/2019)  Tobacco Use: Low Risk  (12/01/2022)    Readmission Risk Interventions     No data to display

## 2023-04-04 NOTE — Telephone Encounter (Signed)
err

## 2023-04-04 NOTE — Telephone Encounter (Signed)
Called to check on patient. She is unsure of when she will be discharged. She reports feeling okay.

## 2023-04-04 NOTE — Discharge Planning (Signed)
Met with pt at beside.

## 2023-04-05 DIAGNOSIS — N859 Noninflammatory disorder of uterus, unspecified: Secondary | ICD-10-CM

## 2023-04-05 DIAGNOSIS — D509 Iron deficiency anemia, unspecified: Secondary | ICD-10-CM | POA: Diagnosis not present

## 2023-04-05 DIAGNOSIS — I10 Essential (primary) hypertension: Secondary | ICD-10-CM | POA: Diagnosis not present

## 2023-04-05 DIAGNOSIS — E785 Hyperlipidemia, unspecified: Secondary | ICD-10-CM | POA: Diagnosis not present

## 2023-04-05 DIAGNOSIS — D649 Anemia, unspecified: Secondary | ICD-10-CM | POA: Diagnosis not present

## 2023-04-05 LAB — COMPREHENSIVE METABOLIC PANEL
ALT: 18 U/L (ref 0–44)
AST: 23 U/L (ref 15–41)
Albumin: 3.2 g/dL — ABNORMAL LOW (ref 3.5–5.0)
Alkaline Phosphatase: 54 U/L (ref 38–126)
Anion gap: 10 (ref 5–15)
BUN: 9 mg/dL (ref 6–20)
CO2: 22 mmol/L (ref 22–32)
Calcium: 8.4 mg/dL — ABNORMAL LOW (ref 8.9–10.3)
Chloride: 104 mmol/L (ref 98–111)
Creatinine, Ser: 0.8 mg/dL (ref 0.44–1.00)
GFR, Estimated: >60 mL/min (ref >60)
Glucose, Bld: 88 mg/dL (ref 70–99)
Potassium: 3.6 mmol/L (ref 3.5–5.1)
Sodium: 136 mmol/L (ref 135–145)
Total Bilirubin: 0.8 mg/dL (ref 0.3–1.2)
Total Protein: 6.2 g/dL — ABNORMAL LOW (ref 6.5–8.1)

## 2023-04-05 LAB — MAGNESIUM: Magnesium: 2.1 mg/dL (ref 1.7–2.4)

## 2023-04-05 LAB — CBC WITH DIFFERENTIAL/PLATELET
Abs Immature Granulocytes: 0.01 10*3/uL (ref 0.00–0.07)
Basophils Absolute: 0.1 10*3/uL (ref 0.0–0.1)
Basophils Relative: 1 %
Eosinophils Absolute: 0.1 10*3/uL (ref 0.0–0.5)
Eosinophils Relative: 2 %
HCT: 25.1 % — ABNORMAL LOW (ref 36.0–46.0)
Hemoglobin: 7.4 g/dL — ABNORMAL LOW (ref 12.0–15.0)
Immature Granulocytes: 0 %
Lymphocytes Relative: 39 %
Lymphs Abs: 2.6 10*3/uL (ref 0.7–4.0)
MCH: 19.7 pg — ABNORMAL LOW (ref 26.0–34.0)
MCHC: 29.5 g/dL — ABNORMAL LOW (ref 30.0–36.0)
MCV: 66.9 fL — ABNORMAL LOW (ref 80.0–100.0)
Monocytes Absolute: 0.6 10*3/uL (ref 0.1–1.0)
Monocytes Relative: 9 %
Neutro Abs: 3.3 10*3/uL (ref 1.7–7.7)
Neutrophils Relative %: 49 %
Platelets: 373 10*3/uL (ref 150–400)
RBC: 3.75 MIL/uL — ABNORMAL LOW (ref 3.87–5.11)
RDW: 21.9 % — ABNORMAL HIGH (ref 11.5–15.5)
WBC: 6.6 10*3/uL (ref 4.0–10.5)
nRBC: 0 % (ref 0.0–0.2)

## 2023-04-05 LAB — LIPID PANEL
Cholesterol: 129 mg/dL (ref 0–200)
HDL: 46 mg/dL (ref >40)
LDL Cholesterol: 68 mg/dL (ref 0–99)
Total CHOL/HDL Ratio: 2.8 RATIO
Triglycerides: 73 mg/dL (ref <150)
VLDL: 15 mg/dL (ref 0–40)

## 2023-04-05 LAB — HAPTOGLOBIN: Haptoglobin: 108 mg/dL (ref 42–296)

## 2023-04-05 LAB — PHOSPHORUS: Phosphorus: 4.2 mg/dL (ref 2.5–4.6)

## 2023-04-05 MED ORDER — FERROUS SULFATE 325 (65 FE) MG PO TABS: 325.0000 mg | ORAL_TABLET | Freq: Two times a day (BID) | ORAL | 0 refills | Status: DC

## 2023-04-05 MED ORDER — ASCORBIC ACID 500 MG PO TABS: 500.0000 mg | ORAL_TABLET | Freq: Every day | ORAL | 0 refills | Status: DC

## 2023-04-05 NOTE — Plan of Care (Signed)

## 2023-04-05 NOTE — Discharge Summary (Signed)
Physician Discharge Summary  Susan [name] ZOX:[id number] DOB: 25-Apr-1971 DOA: 04/03/2023  PCP: Dorothyann Peng, MD  Admit date: 04/03/2023 Discharge date: 04/05/2023  Time spent: 35 minutes  Recommendations for Outpatient Follow-up:   Symptomatic iron deficiency Anemia > Routine labs drawn outpatient and found to have hemoglobin 6.3.  Sent to the ED and hemoglobin confirmed 6.6. > Reports recent fatigue and lightheadedness. > History of prior anemia and GI bleeding.  Currently FOBT is negative and does not report any recent bloody or dark stools.  Colonoscopy 3 years ago showed diverticula but otherwise normal. > Anemia is microcytic and iron is 7 as well as ferritin of 3.  This is likely etiology.  No recent hemoglobin in the last couple years to compare, so possibly has had a slow downtrend. > Has had regular menses since IUD removal about a year ago.  Not significantly heavy bleeding and last for about 4 days. -Transfuse 1 unit PRBC  -5/9 Anemia panel consistent with  - Consider iron transfusion -5/10 CT chest, abdomen pelvis R/O neoplasm - 5/10 anemia panel, haptoglobin, LDH, blood smear -5/10 discussed at length with patient will conduct neoplasm workup. -5/10 iron dextran IV 500 mg x 1 -5/10 vitamin C 500 mg daily - 5/10 iron sulfate 325 mg BID starting on 5/11 -5/10 per radiology will reorder CT abdomen pelvis with contrast see above. -5/11 technologists smear review (PBS)      Component 1 d ago  WBC MORPHOLOGY MORPHOLOGY UNREMARKABLE  RBC MORPHOLOGY TARGET CELLS  Plt Morphology MORPHOLOGY UNREMARKABLE    -511 LDH= 112 (WNL), Haptoglobin= 108 (WNL)     Uterine lesion - 5/11 possible fibroid hypervascular.  Ultrasound recommended for further evaluation -Patient states she sees Dr. Cherly Hensen OB/GYN and would prefer to complete workup with her.  Understands that she needs to contact Dr. Cherly Hensen on Monday and arrange follow-up given new findings.     Essential  hypertension -Amlodipine 10 mg daily   HLD - On Repatha outpatient - 5/11 LDL = 68 within AHA guidelines      Discharge Diagnoses:  Principal Problem:   Symptomatic anemia Active Problems:   HLD (hyperlipidemia)   Essential hypertension   Iron deficiency anemia   Discharge Condition: Stable  Diet recommendation: Regular  There were no vitals filed for this visit.  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   Patient had routine labs drawn yesterday by cardiology.  Hemoglobin came back at 6.3 today and patient was notified and sent to the ED for further evaluation.  Patient does report mild increased fatigue and lightheadedness on standing for the past.  Does have history of prior GI bleeding but denies any recent bloody or dark stools.  Reports a year ago her IUD was taken out and she has had normal menses that last about 4 days every month.   Has reported about a 7 pound unintentional weight loss.  Last colonoscopy 3 years ago.  She denies fevers, chills, chest pain, shortness of breath, abdominal pain, constipation, diarrhea, nausea, vomiting.   ED Course: Vital signs in the ED notable for blood pressure in the 140 systolic.  Lab workup clued CMP with calcium 8.7.  CBC showed hemoglobin of 6.6 with MCV less than 70, platelets 470.  FOBT negative.  Patient typed and screened in the ED.  Reticulocyte count inappropriately normal.  Folate  pending.  B12 normal.  Iron 7, ferritin 3.  1 unit ordered for transfusion.  Hospital Course:  As above     Procedures/Significant Events: 5/10 CT chest abdomen pelvis W0 contrast Evaluation for malignancy of solid organ and lymph nodes can be significantly limited without the advantage of IV contrast. No obvious solid organ mass lesion or lymph node  enlargement.   Clear lungs.   Surgical changes from gastric bypass. Lobular uterus. Possible multiple fibroids. This could be confirmed with ultrasound when clinically appropriate to exclude secondary pathology. 5/10 CT abdomen and pelvis W contrast 1. Large uterine fibroid. A hypervascular lesion posterior to the lower uterine segment measuring 2.3 x 1.3 x 1.4 cm, possible hypervascular fibroid. Ultrasound is recommended for further evaluation. 2. No evidence of lymphadenopathy or metastatic disease.    Discharge Exam: Vitals:   04/04/23 1618 04/04/23 2140 04/05/23 0351 04/05/23 0736  BP: 116/81 (!) 126/95 102/74 117/82  Pulse: 77 75 71 65  Resp: 18 19 18 16   Temp: 99.2 F (37.3 C) 98.2 F (36.8 C) 98.4 F (36.9 C) 98.4 F (36.9 C)  TempSrc:  Oral Oral Oral  SpO2: 100% 100% 99% 98%    General: A/O x 4, No acute respiratory distress, thin Eyes: negative scleral hemorrhage, negative anisocoria, negative icterus ENT: Negative Runny nose, negative gingival bleeding, Neck:  Negative scars, masses, torticollis, lymphadenopathy, JVD Lungs: Clear to auscultation bilaterally without wheezes or crackles Cardiovascular: Regular rate and rhythm without murmur gallop or rub normal S1 and S2 Abdomen: negative abdominal pain, nondistended, positive soft, bowel sounds, no rebound, no ascites, no appreciable mass, appropriate scars on abdomen for Lap-Band surgery with sleeve revision  Discharge Instructions   Allergies as of 04/05/2023       Reactions   Crestor [rosuvastatin]    Myalgias         Medication List     STOP taking these medications    amLODipine 10 MG tablet Commonly known as: NORVASC   valsartan 160 MG tablet Commonly known as: Diovan       TAKE these medications    ascorbic acid 500 MG tablet Commonly known as: VITAMIN C Take 1 tablet (500 mg total) by mouth daily.   ferrous sulfate 325 (65 FE) MG tablet Take 1 tablet (325 mg total) by mouth 2  (two) times daily with a meal.   omeprazole 20 MG capsule Commonly known as: PRILOSEC Take 20 mg by mouth daily.   Repatha SureClick 140 MG/ML Soaj Generic drug: Evolocumab Inject 140 mg into the skin every 14 (fourteen) days.       Allergies  Allergen Reactions   Crestor [Rosuvastatin]     Myalgias       The results of significant diagnostics from this hospitalization (including imaging, microbiology, ancillary and laboratory) are listed below for reference.    Significant Diagnostic Studies: CT CHEST ABDOMEN PELVIS W CONTRAST  Result Date: 04/04/2023 CLINICAL DATA:  Occult malignancy.  Significant weight loss, anemia. EXAM: CT CHEST, ABDOMEN, AND PELVIS WITH CONTRAST TECHNIQUE: Multidetector CT imaging of the chest, abdomen and pelvis was performed following the standard protocol during bolus administration of intravenous contrast. RADIATION DOSE REDUCTION: This exam was performed according to the departmental dose-optimization program which includes automated exposure control, adjustment of the mA and/or kV according to patient size and/or use of iterative reconstruction technique. CONTRAST:  75mL OMNIPAQUE IOHEXOL 350 MG/ML SOLN COMPARISON:  04/04/2023. FINDINGS: CT CHEST FINDINGS Cardiovascular: The heart is normal in size and there is  no pericardial effusion. There is minimal atherosclerotic calcification of the aorta without evidence of aneurysm. Pulmonary trunk is normal in caliber. Mediastinum/Nodes: No mediastinal, hilar, or axillary lymphadenopathy. The thyroid gland, trachea, and esophagus are within normal limits. Lungs/Pleura: Minimal atelectasis or scarring bilaterally. No pulmonary nodule or mass is seen. No pleural effusion or pneumothorax. Musculoskeletal: Minimal degenerative changes in the thoracic spine. No acute or suspicious osseous abnormality. CT ABDOMEN PELVIS FINDINGS Hepatobiliary: No focal liver abnormality is seen. No gallstones, gallbladder wall thickening, or  biliary dilatation. Pancreas: Unremarkable. No pancreatic ductal dilatation or surrounding inflammatory changes. Spleen: Normal in size without focal abnormality. Adrenals/Urinary Tract: No adrenal nodule or mass. The kidneys enhance symmetrically. No renal calculus or hydronephrosis. The bladder is unremarkable. Stomach/Bowel: Gastric bypass changes are noted. No free air or pneumatosis. Appendix appears normal. No evidence of bowel wall thickening, distention, or inflammatory changes. A moderate amount of retained stool is present in the colon. Vascular/Lymphatic: Aortic atherosclerosis. No enlarged abdominal or pelvic lymph nodes. Reproductive: A masses in the uterine fundus, possible fibroid. A hypervascular lesion is seen along the posterior aspect of the uterus in the pelvis measuring 2.3 x 1.3 x 1.4 cm, possible hypervascular. Other: A trace amount of free fluid is noted in the cul-de-sac on the right. No adnexal mass. Musculoskeletal: No acute or suspicious osseous abnormality. IMPRESSION: 1. Large uterine fibroid. A hypervascular lesion posterior to the lower uterine segment measuring 2.3 x 1.3 x 1.4 cm, possible hypervascular fibroid. Ultrasound is recommended for further evaluation. 2. No evidence of lymphadenopathy or metastatic disease. 3. Aortic atherosclerosis. Electronically Signed   By: Thornell Sartorius M.D.   On: 04/04/2023 23:50   CT CHEST ABDOMEN PELVIS WO CONTRAST  Result Date: 04/04/2023 CLINICAL DATA:  Significant weight loss.  Anemia. EXAM: CT CHEST, ABDOMEN AND PELVIS WITHOUT CONTRAST TECHNIQUE: Multidetector CT imaging of the chest, abdomen and pelvis was performed following the standard protocol without IV contrast. RADIATION DOSE REDUCTION: This exam was performed according to the departmental dose-optimization program which includes automated exposure control, adjustment of the mA and/or kV according to patient size and/or use of iterative reconstruction technique. COMPARISON:  Chest  x-ray 2018 FINDINGS: CT CHEST FINDINGS Cardiovascular: On this non IV contrast exam the heart is nonenlarged. Trace pericardial fluid. The thoracic aorta has a overall normal course and caliber. Mediastinum/Nodes: On this non IV contrast exam, grossly no obvious lymph node enlargement seen in the right axillary region. There are some small nodes in the left axilla measuring up to 13 by 8 mm on series 3, image 22. Evaluation of the hila is limited without IV contrast for lymph nodes. No obvious mediastinal nodes. Slightly patulous esophagus. Lungs/Pleura: Lungs are without consolidation, pneumothorax or effusion. There is slight linear opacity at the bases likely scar or atelectasis. There is a calcified nodule at the extreme left lung base, likely related to old granulomatous disease versus some pleural calcification. Musculoskeletal: Mild curvature of the thoracic spine. Minimal degenerative change. CT ABDOMEN PELVIS FINDINGS Hepatobiliary: Evaluation for solid organ pathology a malignancy is significantly limited without the advantage of IV contrast. Grossly no liver lesion identified. The gallbladder is nondilated. Pancreas: No obvious pancreatic mass or ductal dilatation. Spleen: The spleen is nonenlarged. Adrenals/Urinary Tract: The adrenal glands are nonenlarged. No abnormal calcifications are seen within either kidney nor along the course of either ureter. The urinary bladder is distended. Preserved contour. Stomach/Bowel: Oral contrast was administered. Patient is status post gastric bypass. No oral contrast in the excluded gastric  remnant. The stomach and small bowel are nondilated. Moderate diffuse colonic stool. Normal appendix seen in the right lower quadrant. Vascular/Lymphatic: On this non IV contrast exam there is minimal atherosclerotic calcified plaque along the aorta. Grossly normal caliber aorta and IVC. No large lymph nodes are identified. Evaluation for small nodes is limited without IV contrast  in the lack of intra-fat. Reproductive: Lobular uterus. Possible multiple fibroids. Uterus is slightly retroverted. This could be evaluated further with ultrasound as clinically appropriate. No separate adnexal mass. Other: Mild anasarca. Musculoskeletal: Slight curvature of the spine. No degenerative changes along the lower lumbar spine particularly along the facets. L4-5 and L5-S1 in particular. IMPRESSION: Evaluation for malignancy of solid organ and lymph nodes can be significantly limited without the advantage of IV contrast. No obvious solid organ mass lesion or lymph node enlargement. Clear lungs. Surgical changes from gastric bypass. Lobular uterus. Possible multiple fibroids. This could be confirmed with ultrasound when clinically appropriate to exclude secondary pathology. Distended urinary bladder.  Moderate stool. Electronically Signed   By: Karen Kays M.D.   On: 04/04/2023 18:44    Microbiology: No results found for this or any previous visit (from the past 240 hour(s)).   Labs: Basic Metabolic Panel: Recent Labs  Lab 04/03/23 1159 04/04/23 0618 04/05/23 0655  NA 137 136 136  K 3.5 3.7 3.6  CL 103 104 104  CO2 26 25 22   GLUCOSE 90 90 88  BUN 11 8 9   CREATININE 0.75 0.79 0.80  CALCIUM 8.7* 8.7* 8.4*  MG  --   --  2.1  PHOS  --   --  4.2   Liver Function Tests: Recent Labs  Lab 04/03/23 1159 04/05/23 0655  AST 30 23  ALT 21 18  ALKPHOS 56 54  BILITOT 0.6 0.8  PROT 6.7 6.2*  ALBUMIN 3.5 3.2*   No results for input(s): "LIPASE", "AMYLASE" in the last 168 hours. No results for input(s): "AMMONIA" in the last 168 hours. CBC: Recent Labs  Lab 04/02/23 1603 04/03/23 1159 04/03/23 1941 04/04/23 0618 04/04/23 1228 04/05/23 0655  WBC 4.9 4.7  --  5.8 5.2 6.6  NEUTROABS  --  1.6*  --   --  2.3 3.3  HGB 6.3* 6.6* 7.5* 7.6* 7.9* 7.4*  HCT 22.7* 23.3* 24.6* 26.0* 27.0* 25.1*  MCV 67* 66.2*  --  68.6* 68.5* 66.9*  PLT 522* 470*  --  419* 429* 373   Cardiac  Enzymes: No results for input(s): "CKTOTAL", "CKMB", "CKMBINDEX", "TROPONINI" in the last 168 hours. BNP: BNP (last 3 results) No results for input(s): "BNP" in the last 8760 hours.  ProBNP (last 3 results) No results for input(s): "PROBNP" in the last 8760 hours.  CBG: No results for input(s): "GLUCAP" in the last 168 hours.     Signed:  Carolyne Littles, MD [facility]

## 2023-04-07 ENCOUNTER — Encounter: Payer: Self-pay | Admitting: Internal Medicine

## 2023-04-07 ENCOUNTER — Ambulatory Visit (INDEPENDENT_AMBULATORY_CARE_PROVIDER_SITE_OTHER): Payer: BC Managed Care – PPO | Admitting: Internal Medicine

## 2023-04-07 ENCOUNTER — Telehealth: Payer: Self-pay

## 2023-04-07 VITALS — BP 120/84 | HR 95 | Temp 98.4°F | Ht 62.0 in | Wt 133.0 lb

## 2023-04-07 DIAGNOSIS — I119 Hypertensive heart disease without heart failure: Secondary | ICD-10-CM

## 2023-04-07 DIAGNOSIS — D219 Benign neoplasm of connective and other soft tissue, unspecified: Secondary | ICD-10-CM

## 2023-04-07 DIAGNOSIS — Z23 Encounter for immunization: Secondary | ICD-10-CM | POA: Diagnosis not present

## 2023-04-07 DIAGNOSIS — E7800 Pure hypercholesterolemia, unspecified: Secondary | ICD-10-CM | POA: Diagnosis not present

## 2023-04-07 DIAGNOSIS — D649 Anemia, unspecified: Secondary | ICD-10-CM | POA: Diagnosis not present

## 2023-04-07 DIAGNOSIS — R634 Abnormal weight loss: Secondary | ICD-10-CM

## 2023-04-07 DIAGNOSIS — I700 Atherosclerosis of aorta: Secondary | ICD-10-CM | POA: Diagnosis not present

## 2023-04-07 DIAGNOSIS — E559 Vitamin D deficiency, unspecified: Secondary | ICD-10-CM

## 2023-04-07 NOTE — Patient Instructions (Signed)
Iron Deficiency Anemia, Adult  Iron deficiency anemia is a condition in which the concentration of red blood cells or hemoglobin in the blood is below normal because of too little iron. Hemoglobin is a substance in red blood cells that carries oxygen to the body's tissues. When the concentration of red blood cells or hemoglobin is too low, not enough oxygen reaches these tissues. Iron deficiency anemia is usually long-lasting, and it develops over time. It may or may not cause symptoms. It is a common type of anemia. What are the causes? This condition may be caused by: Not enough iron in the diet. Abnormal absorption in the gut. Blood loss. What increases the risk? You are more likely to develop this condition if you get menstrual periods (menstruate) or are pregnant. What are the signs or symptoms? Symptoms of this condition may include: Pale skin, lips, and nail beds. Weakness, dizziness, and getting tired easily. Shortness of breath when moving or exercising. Cold hands or feet. Mild anemia may not cause any symptoms. How is this diagnosed? This condition is diagnosed based on: Your medical history. A physical exam. Blood tests. How is this treated? This condition is treated by correcting the cause of your iron deficiency. Treatment may involve: Adding iron-rich foods to your diet. Taking iron supplements. If you are pregnant or breastfeeding, you may need to take extra iron because your normal diet usually does not provide the amount of iron that you need. Increasing vitamin C intake. Vitamin C helps your body absorb iron. Your health care provider may recommend that you take iron supplements along with a glass of orange juice or a vitamin C supplement. Medicines to make heavy menstrual flow lighter. Surgery or additional testing procedures to determine the cause of your anemia. You may need repeat blood tests to determine whether treatment is working. If the treatment does not  seem to be working, you may need more tests. Follow these instructions at home: Medicines Take over-the-counter and prescription medicines only as told by your health care provider. This includes iron supplements and vitamins. This is important because too much iron can be harmful. For the best iron absorption, you should take iron supplements when your stomach is empty. If you cannot tolerate them on an empty stomach, you may need to take them with food. Do not drink milk or take antacids at the same time as your iron supplements. Milk and antacids may interfere with how your body absorbs iron. Iron supplements may turn stool (feces) a darker color and it may appear black. If you cannot tolerate taking iron supplements by mouth, talk with your health care provider about taking them through an IV or through an injection into a muscle. Eating and drinking Talk with your health care provider before changing your diet. Your provider may recommend that you eat foods that contain a lot of iron, such as: Liver. Low-fat (lean) beef. Breads and cereals that have iron added to them (are fortified). Eggs. Dried fruit. Dark green, leafy vegetables. To help your body use the iron from iron-rich foods, eat those foods at the same time as fresh fruits and vegetables that are high in vitamin C. Foods that are high in vitamin C include: Oranges. Peppers. Tomatoes. Mangoes. Managing constipation If you are taking an iron supplement, it may cause constipation. To prevent or treat constipation, you may need to: Drink enough fluid to keep your urine pale yellow. Take over-the-counter or prescription medicines. Eat foods that are high in fiber, such   as beans, whole grains, and fresh fruits and vegetables. Limit foods that are high in fat and processed sugars, such as fried or sweet foods. General instructions Return to your normal activities as told by your health care provider. Ask your health care provider  what activities are safe for you. Keep all follow-up visits. Contact a health care provider if: You feel nauseous or you vomit. You feel weak. You become light-headed when getting up from a sitting or lying down position. You have unexplained sweating. You develop symptoms of constipation. You have a heaviness in your chest. You have trouble breathing with physical activity. Get help right away if: You faint. If this happens, do not drive yourself to the hospital. You have an irregular or rapid heartbeat. Summary Iron deficiency anemia is a condition in which the concentration of red blood cells or hemoglobin in the blood is below normal because of too little iron. This condition is treated by correcting the cause of your iron deficiency. Take over-the-counter and prescription medicines only as told by your health care provider. This includes iron supplements and vitamins. To help your body use the iron from iron-rich foods, eat those foods at the same time as fresh fruits and vegetables that are high in vitamin C. Seek medical help if you have signs or symptoms of worsening anemia. This information is not intended to replace advice given to you by your health care provider. Make sure you discuss any questions you have with your health care provider. Document Revised: 12/19/2021 Document Reviewed: 12/19/2021 Elsevier Patient Education  2023 [employer] Inc[employer]  

## 2023-04-07 NOTE — Transitions of Care (Post Inpatient/ED Visit) (Signed)
   04/07/2023  Name: Matie Mercure MRN: [id number] DOB: November 12, 1971  Today's TOC FU Call Status:    Transition Care Management Follow-up Telephone Call Date of Discharge: 04/04/22 Discharge Facility: [facility]) Type of Discharge: Inpatient Admission Primary Inpatient Discharge Diagnosis:: Symptomatic iron deficiency Anemia How have you been since you were released from the hospital?: Better Any questions or concerns?: No  Items Reviewed: Did you receive and understand the discharge instructions provided?: Yes Medications obtained,verified, and reconciled?: Yes (Medications Reviewed) Any new allergies since your discharge?: No Dietary orders reviewed?: NA Do you have support at home?: No  Medications Reviewed Today: Medications Reviewed Today     Reviewed by Coolidge Breeze, CMA (Certified Medical Assistant) on 04/07/23 at 0818  Med List Status: <None>   Medication Order Taking? Sig Documenting Provider Last Dose Status Informant  ascorbic acid (VITAMIN C) 500 MG tablet [id number] Yes Take 1 tablet (500 mg total) by mouth daily. Drema Dallas, MD Taking Active   Evolocumab [facility] SURECLICK) 140 MG/ML Ivory Broad [id number] No Inject 140 mg into the skin every 14 (fourteen) days.  Patient not taking: Reported on 04/03/2023   Chilton Si, MD Not Taking Active Self, Pharmacy Records  ferrous sulfate 325 (65 FE) MG tablet [id number] Yes Take 1 tablet (325 mg total) by mouth 2 (two) times daily with a meal. Drema Dallas, MD Taking Active   omeprazole (PRILOSEC) 20 MG capsule [id number] Yes Take 20 mg by mouth daily. [provider] Taking Active Self, Pharmacy Records            Home Care and Equipment/Supplies: Were Home Health Services Ordered?: No Any new equipment or medical supplies ordered?: No  Functional Questionnaire: Do you need assistance with bathing/showering or dressing?: No Do you need assistance with meal preparation?: No Do you need  assistance with eating?: No Do you have difficulty maintaining continence: No Do you need assistance with getting out of bed/getting out of a chair/moving?: No Do you have difficulty managing or taking your medications?: No  Follow up appointments reviewed: PCP Follow-up appointment confirmed?: Yes Date of PCP follow-up appointment?: 04/06/22 Follow-up Provider: Dorothyann Peng Specialist [facility] Follow-up appointment confirmed?: No Reason Specialist Follow-Up Not Confirmed: Patient has Specialist Provider Number and will Call for Appointment Do you need transportation to your follow-up appointment?: No Do you understand care options if your condition(s) worsen?: Yes-patient verbalized understanding    SIGNATURE Randa Lynn, CMA

## 2023-04-07 NOTE — Progress Notes (Signed)
I,Susan [name],acting as a scribe for Susan Aliment, MD.,have documented all relevant documentation on the behalf of Susan Aliment, MD,as directed by  Susan Aliment, MD while in the presence of Susan Aliment, MD.    Subjective:     Patient ID: Susan [name] , female    DOB: 05-12-1971 , 52 y.o.   MRN: [id number]   Chief Complaint  Patient presents with   Hospitalization Follow-up    HPI  Patient presents today for hospital follow up. She was last seen January 2021.  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 Suddenly, she felt lightheaded. Friends helped her to the ground. She states she had eaten well that today, and was properly hydrated. She is not sure what triggered the sx.  She does admit that she had been napping more recently, had been more fatigued (that this was due to normal daily activities, her daughters are graduating seniors so their schedules are busy), and has noticed more sob on exertion.  Her cardiologist was aware of the situation, and then ordered labs. Labs revealed she was markedly anemic, w/ Hb 6.3. She was admitted to [facility] hospital on 5/9 and discharged on 5/11.   Hospital course: She was transfused 1 unit PRBCs w/o complication. Hospitalist planned to d/c however, she did have health care providers advocating for her who demanded more testing be done. CT chest/abd/pelvis performed - revealed hypervascular uterine fibroid. She does not endorse menorrhagia.  Her GYN, Dr. Cherly Hensen, has been closely following the fibroids. She is scheduled for f/u u/s next week. She has yet to start oral supplemental iron. Prior to admission, she was taking supplemental vitamin B12. OF note: she was hemoccult NEGATIVE during her hospitalization.   She has not had any further issues since discharge.  She doesn't feel any different, because she "felt fine", the day of admission.   Letter sent to gyn for pap result.      Past Medical History:   Diagnosis Date   Anemia 2016   with bleeding ulcer   Arthritis    oa hands   Constipation    Dizziness    GERD (gastroesophageal reflux disease)    no current meds   GI bleeding    Glaucoma    Headache    headaches and migraines   History of bleeding ulcers    History of stomach ulcers    Hypertension    Hypothyroidism    Insomnia    Iron deficiency    Irregular periods    Osteoarthritis of both hands    PCOS (polycystic ovarian syndrome)    Pre-diabetes    Syncope    Vitamin D deficiency      Family History  Problem Relation Age of Onset   Hypertension Mother    Cancer Mother        pancreatic   Stroke Mother    Diabetes Mother    Diabetes gravidarum Mother    High blood pressure Mother    Heart disease Mother    Obesity Mother    Hypertension Father    Ulcers Father    Diabetes Father    Thyroid disease Father    Hypertension Brother    CAD Maternal Grandmother    Kidney cancer Maternal Grandmother    Diabetes Paternal Grandmother    Heart attack Paternal Grandfather      Current Outpatient Medications:    ascorbic acid (VITAMIN C) 500 MG tablet, Take 1  tablet (500 mg total) by mouth daily., Disp: 30 tablet, Rfl: 0   Evolocumab (REPATHA SURECLICK) 140 MG/ML SOAJ, Inject 140 mg into the skin every 14 (fourteen) days. (Patient not taking: Reported on 04/03/2023), Disp: 2 mL, Rfl: 3   ferrous sulfate 325 (65 FE) MG tablet, Take 1 tablet (325 mg total) by mouth 2 (two) times daily with a meal., Disp: 30 tablet, Rfl: 0   omeprazole (PRILOSEC) 20 MG capsule, Take 20 mg by mouth daily., Disp: , Rfl:    Allergies  Allergen Reactions   Crestor [Rosuvastatin]     Myalgias      Review of Systems  Constitutional:  Positive for fatigue.  Respiratory: Negative.         POS DOE  Cardiovascular: Negative.   Gastrointestinal:  Negative for blood in stool.  Neurological: Negative.   Psychiatric/Behavioral: Negative.       Today's Vitals   04/07/23 1624  BP:  120/84  Pulse: 95  Temp: 98.4 F (36.9 C)  SpO2: 98%  Weight: 133 lb (60.3 kg)  Height: 5\' 2"  (1.575 m)   Wt Readings from Last 3 Encounters:  04/07/23 133 lb (60.3 kg)  11/04/22 138 lb (62.6 kg)  09/30/22 137 lb 8 oz (62.4 kg)    Body mass index is 24.33 kg/m.  Wt Readings from Last 3 Encounters:  04/07/23 133 lb (60.3 kg)  11/04/22 138 lb (62.6 kg)  09/30/22 137 lb 8 oz (62.4 kg)    The ASCVD Risk score (Arnett DK, et al., 2019) failed to calculate for the following reasons:   The valid total cholesterol range is 130 to 320 mg/dL ++ Objective:  Physical Exam Vitals and nursing note reviewed.  Constitutional:      Appearance: Normal appearance.  HENT:     Head: Normocephalic and atraumatic.  Eyes:     Extraocular Movements: Extraocular movements intact.     Comments: Pale conjunctivae  Cardiovascular:     Rate and Rhythm: Normal rate and regular rhythm.     Heart sounds: Normal heart sounds.  Pulmonary:     Effort: Pulmonary effort is normal.     Breath sounds: Normal breath sounds.  Musculoskeletal:     Cervical back: Normal range of motion.  Skin:    General: Skin is warm.  Neurological:     General: No focal deficit present.     Mental Status: She is alert.  Psychiatric:        Mood and Affect: Mood normal.        Behavior: Behavior normal.      Assessment And Plan:     1. Symptomatic anemia Comments: Hospital notes reviewed, TOC call completed. She agrees to rto for labwork this Thursday. I will check CBC at this time. I plan to recheck iron level in 3 mos.  She also agrees to Hematology evaluation. She would likely benefit from more iron infusions.  - CBC no Diff; Future - Ambulatory referral to Hematology / Oncology  2. Leiomyoma Comments: CT results reviewed w/ patient in detail. She is encouraged to keep upcoming GYN appt, scheduled for next Wed 04/16/23.  3. Pure hypercholesterolemia Comments: Chronic, she is statin intolerant. Has been on  Repatha in the past, labs suggestive of familial hypercholesterolemia. Encouraged to resume Repatha.  4. Hypertensive heart disease without heart failure Comments: Well controlled today. Unable to tolerate diuretic due to frequency/nocturia. Discharged on amlodipine, encouraged to take daily.  5. Aortic atherosclerosis (HCC) Comments: LDL goal < 70.  Seen on CT chest as part of workup for anemia. She is encouraged to live a heart healthy lifestyle and resume Repatha.  6. Unintentional weight loss Comments: Hospital labs reviewed in detail, thyroid function is normal. I will check labs as below. - Prealbumin; Future  7. Vitamin D deficiency disease Comments: I will check vitamin D level when she returns for labs this Thursday after 2pm. - Vitamin D (25 hydroxy); Future  8. Need for Tdap vaccination - Tdap vaccine greater than or equal to 7yo IM    Return in 6 months (on 10/08/2023), or physical, for  .   Patient was given opportunity to ask questions. Patient verbalized understanding of the plan and was able to repeat key elements of the plan. All questions were answered to their satisfaction.   I, Susan Aliment, MD, have reviewed all documentation for this visit. The documentation on 04/07/23 for the exam, diagnosis, procedures, and orders are all accurate and complete.   IF YOU HAVE BEEN REFERRED TO A SPECIALIST, IT MAY TAKE 1-2 WEEKS TO SCHEDULE/PROCESS THE REFERRAL. IF YOU HAVE NOT HEARD FROM US/SPECIALIST IN TWO WEEKS, PLEASE GIVE [place] A CALL AT [phone].   THE PATIENT IS ENCOURAGED TO PRACTICE SOCIAL DISTANCING DUE TO THE COVID-19 PANDEMIC.

## 2023-04-10 ENCOUNTER — Other Ambulatory Visit: Payer: Self-pay

## 2023-04-10 ENCOUNTER — Other Ambulatory Visit: Payer: BC Managed Care – PPO

## 2023-04-10 DIAGNOSIS — R634 Abnormal weight loss: Secondary | ICD-10-CM | POA: Diagnosis not present

## 2023-04-10 DIAGNOSIS — E559 Vitamin D deficiency, unspecified: Secondary | ICD-10-CM

## 2023-04-10 DIAGNOSIS — D649 Anemia, unspecified: Secondary | ICD-10-CM

## 2023-04-10 NOTE — Telephone Encounter (Signed)
Patient was sent to ED for evaluation.

## 2023-04-11 LAB — VITAMIN D 25 HYDROXY (VIT D DEFICIENCY, FRACTURES): Vit D, 25-Hydroxy: 35.2 ng/mL (ref 30.0–100.0)

## 2023-04-11 LAB — CBC
Hematocrit: 27.7 % — ABNORMAL LOW (ref 34.0–46.6)
Hemoglobin: 8 g/dL — ABNORMAL LOW (ref 11.1–15.9)
MCH: 20.7 pg — ABNORMAL LOW (ref 26.6–33.0)
MCHC: 28.9 g/dL — ABNORMAL LOW (ref 31.5–35.7)
MCV: 72 fL — ABNORMAL LOW (ref 79–97)
Platelets: 338 10*3/uL (ref 150–450)
RBC: 3.87 x10E6/uL (ref 3.77–5.28)
RDW: 23.1 % — ABNORMAL HIGH (ref 11.7–15.4)
WBC: 7.5 10*3/uL (ref 3.4–10.8)

## 2023-04-11 LAB — PREALBUMIN: PREALBUMIN: 21 mg/dL (ref 12–34)

## 2023-04-16 DIAGNOSIS — D251 Intramural leiomyoma of uterus: Secondary | ICD-10-CM | POA: Diagnosis not present

## 2023-04-16 DIAGNOSIS — R634 Abnormal weight loss: Secondary | ICD-10-CM | POA: Diagnosis not present

## 2023-04-16 DIAGNOSIS — D509 Iron deficiency anemia, unspecified: Secondary | ICD-10-CM | POA: Diagnosis not present

## 2023-04-18 ENCOUNTER — Encounter (HOSPITAL_COMMUNITY): Payer: Self-pay | Admitting: *Deleted

## 2023-04-23 ENCOUNTER — Encounter: Payer: Self-pay | Admitting: Internal Medicine

## 2023-04-23 ENCOUNTER — Other Ambulatory Visit: Payer: Self-pay | Admitting: Internal Medicine

## 2023-04-23 MED ORDER — FUSION PLUS PO CAPS: ORAL_CAPSULE | ORAL | 4 refills | Status: DC

## 2023-04-29 ENCOUNTER — Telehealth (HOSPITAL_BASED_OUTPATIENT_CLINIC_OR_DEPARTMENT_OTHER): Payer: Self-pay | Admitting: *Deleted

## 2023-04-29 NOTE — Telephone Encounter (Signed)
-----   Message from Alver Sorrow, NP sent at 04/17/2023  5:28 PM EDT ----- A1c of 5.9 which is in the prediabetic range.  We can lower this by increasing physical activity, reduce intake of carbohydrates exercise, Posta, bread, sugar.  Normal thyroid function.  Anemia previously addressed during admission.  Dr. Allyne Gee has referred her to hematology with visit 05/03/2023 to establish.  She was last seen via video visit 11/04/2022 and recommended for follow-up in 4 to 6 weeks.  Recommend scheduling overdue follow-up with Dr. Duke Salvia or APP within the next 2 to 3 months.

## 2023-04-29 NOTE — Telephone Encounter (Signed)
Patient viewed comments  Left message to call back to get follow up scheduled in 2-3 months with either Dr Duke Salvia or NP

## 2023-05-03 ENCOUNTER — Inpatient Hospital Stay: Payer: BC Managed Care – PPO

## 2023-05-03 ENCOUNTER — Inpatient Hospital Stay: Payer: BC Managed Care – PPO | Attending: Hematology and Oncology | Admitting: Hematology and Oncology

## 2023-05-03 VITALS — BP 136/94 | HR 69 | Temp 98.8°F | Resp 14 | Ht 62.5 in | Wt 136.6 lb

## 2023-05-03 DIAGNOSIS — Z8051 Family history of malignant neoplasm of kidney: Secondary | ICD-10-CM

## 2023-05-03 DIAGNOSIS — Z800 Family history of malignant neoplasm of digestive organs: Secondary | ICD-10-CM

## 2023-05-03 DIAGNOSIS — D509 Iron deficiency anemia, unspecified: Secondary | ICD-10-CM

## 2023-05-03 DIAGNOSIS — R5383 Other fatigue: Secondary | ICD-10-CM

## 2023-05-03 DIAGNOSIS — F5089 Other specified eating disorder: Secondary | ICD-10-CM

## 2023-05-03 DIAGNOSIS — D62 Acute posthemorrhagic anemia: Secondary | ICD-10-CM

## 2023-05-03 DIAGNOSIS — Z8041 Family history of malignant neoplasm of ovary: Secondary | ICD-10-CM

## 2023-05-03 DIAGNOSIS — Z9884 Bariatric surgery status: Secondary | ICD-10-CM

## 2023-05-03 LAB — CBC WITH DIFFERENTIAL/PLATELET
Abs Immature Granulocytes: 0.01 10*3/uL (ref 0.00–0.07)
Basophils Absolute: 0.1 10*3/uL (ref 0.0–0.1)
Basophils Relative: 2 %
Eosinophils Absolute: 0.1 10*3/uL (ref 0.0–0.5)
Eosinophils Relative: 2 %
HCT: 34 % — ABNORMAL LOW (ref 36.0–46.0)
Hemoglobin: 10.4 g/dL — ABNORMAL LOW (ref 12.0–15.0)
Immature Granulocytes: 0 %
Lymphocytes Relative: 35 %
Lymphs Abs: 1.9 10*3/uL (ref 0.7–4.0)
MCH: 24.5 pg — ABNORMAL LOW (ref 26.0–34.0)
MCHC: 30.6 g/dL (ref 30.0–36.0)
MCV: 80.2 fL (ref 80.0–100.0)
Monocytes Absolute: 0.4 10*3/uL (ref 0.1–1.0)
Monocytes Relative: 8 %
Neutro Abs: 2.9 10*3/uL (ref 1.7–7.7)
Neutrophils Relative %: 53 %
Platelets: 295 10*3/uL (ref 150–400)
RBC: 4.24 MIL/uL (ref 3.87–5.11)
Smear Review: NORMAL
WBC: 5.5 10*3/uL (ref 4.0–10.5)
nRBC: 0 % (ref 0.0–0.2)

## 2023-05-03 LAB — IRON AND IRON BINDING CAPACITY (CC-WL,HP ONLY)
Iron: 46 ug/dL (ref 28–170)
Saturation Ratios: 11 % (ref 10.4–31.8)
TIBC: 431 ug/dL (ref 250–450)
UIBC: 385 ug/dL (ref 148–442)

## 2023-05-03 LAB — FERRITIN: Ferritin: 36 ng/mL (ref 11–307)

## 2023-05-03 NOTE — Progress Notes (Signed)
[facility] CONSULT NOTE  Patient Care Team: Dorothyann Peng, MD as PCP - General (Internal Medicine)  CHIEF COMPLAINTS/PURPOSE OF CONSULTATION:  IDA  ASSESSMENT & PLAN:   This is a very pleasant 52 yr old female patient with past medical history significant for gastric ulcers back in 2018 when she had acute blood loss anemia most recently was found to have severe fatigue, lightheadedness and hence requested some labs with her cardiologist.  This showed severe anemia and hence she went to the emergency room, took blood transfusion and iron infusion.  Iron deficiency anemia affects a large proportion of the wellness population especially females of childbearing age and children.  Common causes of iron deficiency include blood loss, reduced iron absorption because of previous surgeries, dietary restrictions or other malabsorption issues, medications that reduce gastric acidity are due to inherited disorders such as IRIDA due to TMPRSS6 mutation.  Symptoms of iron deficiency usually include fatigue, pica, restless legs, exercise intolerance, exertional dyspnea, headaches and weakness. Diagnosis can be usually made by history, physical examination, CBC and iron studies.  We generally treat iron deficiency anemia with oral supplements if this can be tolerated.  IV iron is appropriate for patients are unable to tolerate due to gastrointestinal side effects or for patients with severe/ongoing blood loss, history of gastric bypass which reduces gastric acid and henceforth will impair intestinal absorption of oral iron, malabsorption syndromes are occasional in pregnancy.  In this case given her severe anemia, gastric bypass surgery, I think it is reasonable to consider intravenous iron.  There are several formularies of intravenous iron available in the market.  We have discussed about risk of allergic/infusion reactions including potentially life-threatening anaphylaxis with intravenous iron  however these serious allergic reactions are exceedingly rare and overestimated.  In contrast to serious allergic reactions, IV iron may be associated with nonallergic infusion reactions including self-limited urticaria, palpitations, dizziness, neck and back spasm which again occur in less than 1% of the individuals and do not progress to more serious reactions.  CBC, iron panel and ferritin today. Schedule Venofer infusions Return to clinic in 3 months.   HISTORY OF PRESENTING ILLNESS:  Susan [name] 52 y.o. female is here because of IDA  This is a very pleasant 52 yr old female patient with PMH significant for gastric ulcer disease in 2018 referred to hematology for IDA. She says she has been fatigued for several months and felt light headed as well. She happened to be light headed while she was talking to her cardiologist outside the clinic ( the doc happens to be friend) and then had out patient labs which showed severe anemia. She was noted to have severe anemia, and sent to the ED. In the ED, she got blood transfusion and Iron infusion. She also noted PICA, loves eat baking soda and cold water. In the past week, she felt better, but once again lethargic. No hematochezia or melena. No unusual menstruation, menstruates regularly.  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  She otherwise feels well. She is not taking any iron supplements she says.  Rest of the pertinent 10 point ROS reviewed and neg.  MEDICAL HISTORY:  Past Medical History:  Diagnosis Date   Anemia 2016   with bleeding ulcer   Arthritis    oa hands   Constipation    Dizziness    GERD (gastroesophageal reflux disease)    no current meds   GI bleeding    Glaucoma    Headache  headaches and migraines   History of bleeding ulcers    History of stomach ulcers    Hypertension    Hypothyroidism    Insomnia    Iron deficiency    Irregular periods    Osteoarthritis of both hands    PCOS (polycystic ovarian  syndrome)    Pre-diabetes    Syncope    Vitamin D deficiency     SURGICAL HISTORY: Past Surgical History:  Procedure Laterality Date   BILATERAL SALPINGECTOMY Bilateral 03/22/2013   Procedure: BILATERAL SALPINGECTOMY;  Surgeon: Serita Kyle, MD;  Location: [facility];  Service: Gynecology;  Laterality: Bilateral;   CARPAL TUNNEL RELEASE Right 11/15/2013   Procedure: RIGHT CARPAL TUNNEL RELEASE;  Surgeon: Nicki Reaper, MD;  Location: [facility];  Service: Orthopedics;  Laterality: Right;   COSMETIC SURGERY  01/2015   tummy tuck    ESOPHAGOGASTRODUODENOSCOPY N/A 10/04/2015   Procedure: ESOPHAGOGASTRODUODENOSCOPY (EGD);  Surgeon: Ruffin Frederick, MD;  Location: [facility];  Service: Gastroenterology;  Laterality: N/A;   GANGLION CYST EXCISION Right 11/15/2013   Procedure: EXCISION CYST RIGHT WRIST;  Surgeon: Nicki Reaper, MD;  Location: [facility];  Service: Orthopedics;  Laterality: Right;   LAPAROSCOPIC GASTRIC BAND REMOVAL WITH LAPAROSCOPIC GASTRIC SLEEVE RESECTION N/A 09/23/2017   Procedure: LAPAROSCOPIC GASTRIC BAND REMOVAL WITH LAPAROSCOPIC GASTRIC SLEEVE RESECTION;  Surgeon: Luretha Murphy, MD;  Location: [facility];  Service: General;  Laterality: N/A;   LAPAROSCOPIC GASTRIC BANDING  2010   LAPAROSCOPY N/A 03/22/2013   Procedure: LAPAROSCOPY OPERATIVE;  Surgeon: Serita Kyle, MD;  Location: [facility];  Service: Gynecology;  Laterality: N/A;   MOUTH SURGERY  1991   SALPINGECTOMY  03/22/2013    SOCIAL HISTORY: Social History   Socioeconomic History   Marital status: Married    Spouse name: Marcelle   Number of children: Not on file   Years of education: Not on file   Highest education level: Not on file  Occupational History   Occupation: Pediatric Dentist  Tobacco Use   Smoking status: Never   Smokeless tobacco: Never  Vaping Use   Vaping Use: Never used  Substance and Sexual Activity   Alcohol use: Yes    Alcohol/week: 1.0  standard drink of alcohol    Types: 1 Standard drinks or equivalent per week    Comment: socially   Drug use: No   Sexual activity: Not on file  Other Topics Concern   Not on file  Social History Narrative   Not on file   Social Determinants of Health   Financial Resource Strain: Not on file  Food Insecurity: No Food Insecurity (04/03/2023)   Hunger Vital Sign    Worried About Running Out of Food in the Last Year: Never true    Ran Out of Food in the Last Year: Never true  Transportation Needs: No Transportation Needs (04/03/2023)   PRAPARE - Administrator, Civil Service (Medical): No    Lack of Transportation (Non-Medical): No  Physical Activity: Not on file  Stress: Not on file  Social Connections: Not on file  Intimate Partner Violence: Not At Risk (04/03/2023)   Humiliation, Afraid, Rape, and Kick questionnaire    Fear of Current or Ex-Partner: No    Emotionally Abused: No    Physically Abused: No    Sexually Abused: No    FAMILY HISTORY: Family History  Problem Relation Age of Onset   Hypertension Mother    Cancer Mother  pancreatic   Stroke Mother    Diabetes Mother    Diabetes gravidarum Mother    High blood pressure Mother    Heart disease Mother    Obesity Mother    Hypertension Father    Ulcers Father    Diabetes Father    Thyroid disease Father    Hypertension Brother    CAD Maternal Grandmother    Kidney cancer Maternal Grandmother    Diabetes Paternal Grandmother    Heart attack Paternal Grandfather     ALLERGIES:  is allergic to crestor [rosuvastatin].  MEDICATIONS:  Current Outpatient Medications  Medication Sig Dispense Refill   ascorbic acid (VITAMIN C) 500 MG tablet Take 1 tablet (500 mg total) by mouth daily. 30 tablet 0   Evolocumab (REPATHA SURECLICK) 140 MG/ML SOAJ Inject 140 mg into the skin every 14 (fourteen) days. (Patient not taking: Reported on 04/03/2023) 2 mL 3   Iron-FA-B Cmp-C-Biot-Probiotic (FUSION PLUS) CAPS One  capsule po qd 30 capsule 4   omeprazole (PRILOSEC) 20 MG capsule Take 20 mg by mouth daily.     No current facility-administered medications for this visit.     PHYSICAL EXAMINATION: ECOG PERFORMANCE STATUS: 0 - Asymptomatic  Vitals:   05/03/23 0807  BP: (!) 136/94  Pulse: 69  Resp: 14  Temp: 98.8 F (37.1 C)  SpO2: 100%   Filed Weights   05/03/23 0807  Weight: 136 lb 9.6 oz (62 kg)    GENERAL:alert, no distress and comfortable SKIN: skin color, texture, turgor are normal,  NECK: supple, LYMPH:  no palpable lymphadenopathy in the cervical, axillary  LUNGS: clear to auscultation and percussion with normal breathing effort HEART: regular rate & rhythm and no murmurs and no lower extremity edema ABDOMEN:abdomen soft, non-tender and normal bowel sounds Musculoskeletal:no cyanosis of digits PSYCH: alert & oriented x 3 with fluent speech NEURO: no focal motor/sensory deficits  LABORATORY DATA:  I have reviewed the data as listed Lab Results  Component Value Date   WBC 7.5 04/10/2023   HGB 8.0 (L) 04/10/2023   HCT 27.7 (L) 04/10/2023   MCV 72 (L) 04/10/2023   PLT 338 04/10/2023     Chemistry      Component Value Date/Time   NA 136 04/05/2023 0655   NA 140 03/19/2023 1647   K 3.6 04/05/2023 0655   CL 104 04/05/2023 0655   CO2 22 04/05/2023 0655   BUN 9 04/05/2023 0655   BUN 10 03/19/2023 1647   CREATININE 0.80 04/05/2023 0655      Component Value Date/Time   CALCIUM 8.4 (L) 04/05/2023 0655   ALKPHOS 54 04/05/2023 0655   AST 23 04/05/2023 0655   ALT 18 04/05/2023 0655   BILITOT 0.8 04/05/2023 0655   BILITOT 0.4 03/19/2023 1647       RADIOGRAPHIC STUDIES: I have personally reviewed the radiological images as listed and agreed with the findings in the report. CT CHEST ABDOMEN PELVIS W CONTRAST  Result Date: 04/04/2023 CLINICAL DATA:  Occult malignancy.  Significant weight loss, anemia. EXAM: CT CHEST, ABDOMEN, AND PELVIS WITH CONTRAST TECHNIQUE:  Multidetector CT imaging of the chest, abdomen and pelvis was performed following the standard protocol during bolus administration of intravenous contrast. RADIATION DOSE REDUCTION: This exam was performed according to the departmental dose-optimization program which includes automated exposure control, adjustment of the mA and/or kV according to patient size and/or use of iterative reconstruction technique. CONTRAST:  75mL OMNIPAQUE IOHEXOL 350 MG/ML SOLN COMPARISON:  04/04/2023. FINDINGS: CT CHEST FINDINGS  Cardiovascular: The heart is normal in size and there is no pericardial effusion. There is minimal atherosclerotic calcification of the aorta without evidence of aneurysm. Pulmonary trunk is normal in caliber. Mediastinum/Nodes: No mediastinal, hilar, or axillary lymphadenopathy. The thyroid gland, trachea, and esophagus are within normal limits. Lungs/Pleura: Minimal atelectasis or scarring bilaterally. No pulmonary nodule or mass is seen. No pleural effusion or pneumothorax. Musculoskeletal: Minimal degenerative changes in the thoracic spine. No acute or suspicious osseous abnormality. CT ABDOMEN PELVIS FINDINGS Hepatobiliary: No focal liver abnormality is seen. No gallstones, gallbladder wall thickening, or biliary dilatation. Pancreas: Unremarkable. No pancreatic ductal dilatation or surrounding inflammatory changes. Spleen: Normal in size without focal abnormality. Adrenals/Urinary Tract: No adrenal nodule or mass. The kidneys enhance symmetrically. No renal calculus or hydronephrosis. The bladder is unremarkable. Stomach/Bowel: Gastric bypass changes are noted. No free air or pneumatosis. Appendix appears normal. No evidence of bowel wall thickening, distention, or inflammatory changes. A moderate amount of retained stool is present in the colon. Vascular/Lymphatic: Aortic atherosclerosis. No enlarged abdominal or pelvic lymph nodes. Reproductive: A masses in the uterine fundus, possible fibroid. A  hypervascular lesion is seen along the posterior aspect of the uterus in the pelvis measuring 2.3 x 1.3 x 1.4 cm, possible hypervascular. Other: A trace amount of free fluid is noted in the cul-de-sac on the right. No adnexal mass. Musculoskeletal: No acute or suspicious osseous abnormality. IMPRESSION: 1. Large uterine fibroid. A hypervascular lesion posterior to the lower uterine segment measuring 2.3 x 1.3 x 1.4 cm, possible hypervascular fibroid. Ultrasound is recommended for further evaluation. 2. No evidence of lymphadenopathy or metastatic disease. 3. Aortic atherosclerosis. Electronically Signed   By: Thornell Sartorius M.D.   On: 04/04/2023 23:50   CT CHEST ABDOMEN PELVIS WO CONTRAST  Result Date: 04/04/2023 CLINICAL DATA:  Significant weight loss.  Anemia. EXAM: CT CHEST, ABDOMEN AND PELVIS WITHOUT CONTRAST TECHNIQUE: Multidetector CT imaging of the chest, abdomen and pelvis was performed following the standard protocol without IV contrast. RADIATION DOSE REDUCTION: This exam was performed according to the departmental dose-optimization program which includes automated exposure control, adjustment of the mA and/or kV according to patient size and/or use of iterative reconstruction technique. COMPARISON:  Chest x-ray 2018 FINDINGS: CT CHEST FINDINGS Cardiovascular: On this non IV contrast exam the heart is nonenlarged. Trace pericardial fluid. The thoracic aorta has a overall normal course and caliber. Mediastinum/Nodes: On this non IV contrast exam, grossly no obvious lymph node enlargement seen in the right axillary region. There are some small nodes in the left axilla measuring up to 13 by 8 mm on series 3, image 22. Evaluation of the hila is limited without IV contrast for lymph nodes. No obvious mediastinal nodes. Slightly patulous esophagus. Lungs/Pleura: Lungs are without consolidation, pneumothorax or effusion. There is slight linear opacity at the bases likely scar or atelectasis. There is a  calcified nodule at the extreme left lung base, likely related to old granulomatous disease versus some pleural calcification. Musculoskeletal: Mild curvature of the thoracic spine. Minimal degenerative change. CT ABDOMEN PELVIS FINDINGS Hepatobiliary: Evaluation for solid organ pathology a malignancy is significantly limited without the advantage of IV contrast. Grossly no liver lesion identified. The gallbladder is nondilated. Pancreas: No obvious pancreatic mass or ductal dilatation. Spleen: The spleen is nonenlarged. Adrenals/Urinary Tract: The adrenal glands are nonenlarged. No abnormal calcifications are seen within either kidney nor along the course of either ureter. The urinary bladder is distended. Preserved contour. Stomach/Bowel: Oral contrast was administered. Patient is status  post gastric bypass. No oral contrast in the excluded gastric remnant. The stomach and small bowel are nondilated. Moderate diffuse colonic stool. Normal appendix seen in the right lower quadrant. Vascular/Lymphatic: On this non IV contrast exam there is minimal atherosclerotic calcified plaque along the aorta. Grossly normal caliber aorta and IVC. No large lymph nodes are identified. Evaluation for small nodes is limited without IV contrast in the lack of intra-fat. Reproductive: Lobular uterus. Possible multiple fibroids. Uterus is slightly retroverted. This could be evaluated further with ultrasound as clinically appropriate. No separate adnexal mass. Other: Mild anasarca. Musculoskeletal: Slight curvature of the spine. No degenerative changes along the lower lumbar spine particularly along the facets. L4-5 and L5-S1 in particular. IMPRESSION: Evaluation for malignancy of solid organ and lymph nodes can be significantly limited without the advantage of IV contrast. No obvious solid organ mass lesion or lymph node enlargement. Clear lungs. Surgical changes from gastric bypass. Lobular uterus. Possible multiple fibroids. This  could be confirmed with ultrasound when clinically appropriate to exclude secondary pathology. Distended urinary bladder.  Moderate stool. Electronically Signed   By: Karen Kays M.D.   On: 04/04/2023 18:44    All questions were answered. The patient knows to call the clinic with any problems, questions or concerns. I spent 45 minutes in the care of this patient including H and P, review of records, counseling and coordination of care.     Rachel Moulds, MD 05/03/2023 8:25 AM

## 2023-05-06 ENCOUNTER — Telehealth: Payer: Self-pay

## 2023-05-06 NOTE — Telephone Encounter (Signed)
Pt is in agreement with PO Fe and will RTC in 3 mos. Message sent to scheduler.

## 2023-05-06 NOTE — Telephone Encounter (Signed)
-----   Message from Rachel Moulds, MD sent at 05/03/2023 12:24 PM EDT ----- Hemoglobin, iron panel and ferritin all significantly better. She can opt out of iron infusion and continue to take oral iron and RTC in 3 months, please talk to her and see what she thinks. I can cancel the iron infusion if she agrees.

## 2023-05-08 ENCOUNTER — Inpatient Hospital Stay: Payer: BC Managed Care – PPO

## 2023-05-09 ENCOUNTER — Encounter: Payer: Self-pay | Admitting: Hematology and Oncology

## 2023-05-14 ENCOUNTER — Telehealth: Payer: Self-pay | Admitting: Hematology and Oncology

## 2023-05-14 NOTE — Telephone Encounter (Signed)
Patient requested genetics, patient is aware of upcoming appointment.

## 2023-05-15 ENCOUNTER — Inpatient Hospital Stay: Payer: BC Managed Care – PPO

## 2023-05-22 ENCOUNTER — Inpatient Hospital Stay: Payer: BC Managed Care – PPO

## 2023-06-24 ENCOUNTER — Encounter: Payer: Self-pay | Admitting: Hematology and Oncology

## 2023-06-24 ENCOUNTER — Other Ambulatory Visit (HOSPITAL_BASED_OUTPATIENT_CLINIC_OR_DEPARTMENT_OTHER): Payer: Self-pay | Admitting: Cardiovascular Disease

## 2023-06-24 NOTE — Telephone Encounter (Signed)
Please call pt to schedule follow-up appointment with Dr. Duke Salvia for refills. Thank you!

## 2023-06-25 ENCOUNTER — Encounter: Payer: Self-pay | Admitting: Internal Medicine

## 2023-06-25 ENCOUNTER — Ambulatory Visit (INDEPENDENT_AMBULATORY_CARE_PROVIDER_SITE_OTHER): Payer: BC Managed Care – PPO | Admitting: Internal Medicine

## 2023-06-25 ENCOUNTER — Encounter: Payer: Self-pay | Admitting: Hematology and Oncology

## 2023-06-25 VITALS — BP 122/84 | HR 87 | Temp 98.1°F | Ht 62.0 in | Wt 136.6 lb

## 2023-06-25 DIAGNOSIS — Z566 Other physical and mental strain related to work: Secondary | ICD-10-CM

## 2023-06-25 DIAGNOSIS — F5102 Adjustment insomnia: Secondary | ICD-10-CM | POA: Diagnosis not present

## 2023-06-25 DIAGNOSIS — F419 Anxiety disorder, unspecified: Secondary | ICD-10-CM

## 2023-06-25 NOTE — Telephone Encounter (Signed)
Patient is scheduled 07/04/23 with Gillian Shields, NP

## 2023-06-25 NOTE — Progress Notes (Signed)
I,[name], CMA,acting as a Neurosurgeon for [name], MD.,have documented all relevant documentation on the behalf of [name], MD,as directed by  [name], MD while in the presence of [name], MD.  Subjective:  Patient ID: Susan [name] , female    DOB: 1971-07-10 , 52 y.o.   MRN: [id number]  Chief Complaint  Patient presents with   Anxiety    HPI  Patient presents today for further evaluation of anxiety.  She admits being overwhelmed. She admits to being under a great deal of stress. 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  She was not properly trained or given an orientation.   She states she is under a great deal of stress. She has not been able to sleep. She has also noted frequent headaches.  Additionally, both of her daughters are going to college in the Fall.   She is interested in medication.  Letter sent to GYN for pap result.      Past Medical History:  Diagnosis Date   Anemia 2016   with bleeding ulcer   Arthritis    oa hands   Constipation    Dizziness    GERD (gastroesophageal reflux disease)    no current meds   GI bleeding    Glaucoma    Headache    headaches and migraines   History of bleeding ulcers    History of stomach ulcers    Hypertension    Hypothyroidism    Insomnia    Iron deficiency    Irregular periods    Osteoarthritis of both hands    PCOS (polycystic ovarian syndrome)    Pre-diabetes    Syncope    Vitamin D deficiency      Family History  Problem Relation Age of Onset   Hypertension Mother    Cancer Mother        pancreatic   Stroke Mother    Diabetes Mother    Diabetes gravidarum Mother    High blood pressure Mother    Heart disease Mother    Obesity Mother    Hypertension Father    Ulcers Father    Diabetes Father    Thyroid disease Father    Hypertension Brother    CAD Maternal Grandmother    Kidney cancer Maternal Grandmother    Diabetes Paternal Grandmother     Heart attack Paternal Grandfather      Current Outpatient Medications:    escitalopram (LEXAPRO) 10 MG tablet, Take 1 tablet (10 mg total) by mouth daily., Disp: 30 tablet, Rfl: 1   eszopiclone 3 MG TABS, Take 1 tablet (3 mg total) by mouth at bedtime as needed. Take immediately before bedtime, Disp: 30 tablet, Rfl: 1   Evolocumab (REPATHA SURECLICK) 140 MG/ML SOAJ, Inject 140 mg into the skin every 14 (fourteen) days. Please keep your upcoming appointment for refills., Disp: 2 mL, Rfl: 0   Iron-FA-B Cmp-C-Biot-Probiotic (FUSION PLUS) CAPS, One capsule po qd, Disp: 30 capsule, Rfl: 4   omeprazole (PRILOSEC) 20 MG capsule, Take 20 mg by mouth daily., Disp: , Rfl:    Allergies  Allergen Reactions   Crestor [Rosuvastatin]     Myalgias      Review of Systems  Constitutional: Negative.   Respiratory: Negative.    Cardiovascular: Negative.   Gastrointestinal: Negative.   Skin: Negative.   Neurological: Negative.   Psychiatric/Behavioral:  Positive for sleep disturbance. The patient is nervous/anxious.  Today's Vitals   06/25/23 1630  BP: 122/84  Pulse: 87  Temp: 98.1 F (36.7 C)  SpO2: 98%  Weight: 136 lb 9.6 oz (62 kg)  Height: 5\' 2"  (1.575 m)   Body mass index is 24.98 kg/m.  Wt Readings from Last 3 Encounters:  06/25/23 136 lb 9.6 oz (62 kg)  05/03/23 136 lb 9.6 oz (62 kg)  04/07/23 133 lb (60.3 kg)     Objective:  Physical Exam Vitals and nursing note reviewed.  Constitutional:      Appearance: Normal appearance.  HENT:     Head: Normocephalic and atraumatic.  Eyes:     Extraocular Movements: Extraocular movements intact.  Cardiovascular:     Rate and Rhythm: Normal rate and regular rhythm.     Heart sounds: Normal heart sounds.  Pulmonary:     Effort: Pulmonary effort is normal.     Breath sounds: Normal breath sounds.  Musculoskeletal:     Cervical back: Normal range of motion.  Skin:    General: Skin is warm.  Neurological:     General: No  focal deficit present.     Mental Status: She is alert.  Psychiatric:        Mood and Affect: Mood normal.        Behavior: Behavior normal.         Assessment And Plan:  Anxiety Assessment & Plan: There is also an element of depression. She agrees to pharmacologic treatment. I will start her on escitalopram 10mg  daily. Advised to take in the evenings. Possible side effects discussed with the patient in full detail.  She will f/u in four weeks. I will also refer her to Psychiatry for further evaluation. I spent over 40 minutes with the patient discussing treatment options, need for counseling, etc. All questions were answered to her satisfaction.   Orders: -     Ambulatory referral to Psychiatry  Adjustment insomnia Assessment & Plan: She is encouraged to develop a bedtime routine. She would likely benefit from Mg supplementation nightly, most specifically Mg glycinate.  However, due to the chronicity of her sx, will send rx meds. Will start her on Lunesta 3mg  nightly. She will let me know if it causes grogginess the next day.   Orders: -     Ambulatory referral to Psychiatry  Work-related stress Assessment & Plan: She is advised that she may need to speak to HR for FMLA paperwork and possibly short term disability.  She also agrees to therapy, referral placed.    Other orders -     Escitalopram Oxalate; Take 1 tablet (10 mg total) by mouth daily.  Dispense: 30 tablet; Refill: 1 -     Eszopiclone; Take 1 tablet (3 mg total) by mouth at bedtime as needed. Take immediately before bedtime  Dispense: 30 tablet; Refill: 1     Return if symptoms worsen or fail to improve.  Patient was given opportunity to ask questions. Patient verbalized understanding of the plan and was able to repeat key elements of the plan. All questions were answered to their satisfaction.    I, [name], MD, have reviewed all documentation for this visit. The documentation on 06/29/23 for the exam,  diagnosis, procedures, and orders are all accurate and complete.   IF YOU HAVE BEEN REFERRED TO A SPECIALIST, IT MAY TAKE 1-2 WEEKS TO SCHEDULE/PROCESS THE REFERRAL. IF YOU HAVE NOT HEARD FROM US/SPECIALIST IN TWO WEEKS, PLEASE GIVE [place] A CALL AT [phone] X [phone].  THE PATIENT IS ENCOURAGED TO PRACTICE SOCIAL DISTANCING DUE TO THE COVID-19 PANDEMIC.

## 2023-06-25 NOTE — Telephone Encounter (Signed)
Rx request sent to pharmacy.  

## 2023-06-26 ENCOUNTER — Telehealth: Payer: Self-pay | Admitting: Cardiovascular Disease

## 2023-06-26 NOTE — Telephone Encounter (Signed)
[facility] called in asking if prior auth for Repatha was received.

## 2023-06-27 ENCOUNTER — Other Ambulatory Visit (HOSPITAL_COMMUNITY): Payer: Self-pay

## 2023-06-27 ENCOUNTER — Encounter: Payer: Self-pay | Admitting: Hematology and Oncology

## 2023-06-27 NOTE — Telephone Encounter (Signed)
Request was received in covermymeds. Our team will work to complete.

## 2023-06-29 DIAGNOSIS — F419 Anxiety disorder, unspecified: Secondary | ICD-10-CM | POA: Insufficient documentation

## 2023-06-29 DIAGNOSIS — F5102 Adjustment insomnia: Secondary | ICD-10-CM | POA: Insufficient documentation

## 2023-06-29 DIAGNOSIS — Z566 Other physical and mental strain related to work: Secondary | ICD-10-CM | POA: Insufficient documentation

## 2023-06-29 MED ORDER — ESZOPICLONE 3 MG PO TABS: 3.0000 mg | ORAL_TABLET | Freq: Every evening | ORAL | 1 refills | Status: DC | PRN

## 2023-06-29 MED ORDER — ESCITALOPRAM OXALATE 10 MG PO TABS: 10.0000 mg | ORAL_TABLET | Freq: Every day | ORAL | 1 refills | Status: DC

## 2023-06-29 NOTE — Assessment & Plan Note (Signed)
She is advised that she may need to speak to HR for FMLA paperwork and possibly short term disability.  She also agrees to therapy, referral placed.

## 2023-06-29 NOTE — Assessment & Plan Note (Signed)
There is also an element of depression. She agrees to pharmacologic treatment. I will start her on escitalopram 10mg  daily. Advised to take in the evenings. Possible side effects discussed with the patient in full detail.  She will f/u in four weeks. I will also refer her to Psychiatry for further evaluation. I spent over 40 minutes with the patient discussing treatment options, need for counseling, etc. All questions were answered to her satisfaction.

## 2023-06-29 NOTE — Assessment & Plan Note (Signed)
She is encouraged to develop a bedtime routine. She would likely benefit from Mg supplementation nightly, most specifically Mg glycinate.  However, due to the chronicity of her sx, will send rx meds. Will start her on Lunesta 3mg  nightly. She will let me know if it causes grogginess the next day.

## 2023-06-30 NOTE — Telephone Encounter (Signed)
Caller is following-up on prior authorization for Evolocumab (REPATHA SURECLICK) 140 MG/ML SOAJ.  Ref# [id number]

## 2023-07-01 ENCOUNTER — Telehealth: Payer: Self-pay | Admitting: Pharmacy Technician

## 2023-07-01 ENCOUNTER — Other Ambulatory Visit (HOSPITAL_COMMUNITY): Payer: Self-pay

## 2023-07-01 NOTE — Telephone Encounter (Signed)
Pharmacy Patient Advocate Encounter   Received notification from CoverMyMeds that prior authorization for REPATHA SURECLICK 140MG /ML AUTO-INJECTORS  is required/requested.   Insurance verification completed.   The patient is insured through [facility] .   Per test claim: PA required; PA submitted to [facility] via CoverMyMeds Key/confirmation #/[id number] Status is pending

## 2023-07-02 ENCOUNTER — Encounter: Payer: Self-pay | Admitting: Hematology and Oncology

## 2023-07-02 ENCOUNTER — Other Ambulatory Visit (HOSPITAL_COMMUNITY): Payer: Self-pay

## 2023-07-02 NOTE — Telephone Encounter (Signed)
Pharmacy Patient Advocate Encounter  Received notification from [facility] that Prior Authorization for REPATHA SURECLICK 140MG /ML AUTO-INJECTORS  has been APPROVED from 07/01/2023 to 06/30/2024. Ran test claim, Copay is $47.00. This test claim was processed through [facility] copay amounts may vary at other pharmacies due to pharmacy/plan contracts, or as the patient moves through the different stages of their insurance plan.   PA #/Case ID/Reference #: [id number]

## 2023-07-04 ENCOUNTER — Ambulatory Visit (HOSPITAL_BASED_OUTPATIENT_CLINIC_OR_DEPARTMENT_OTHER): Payer: 59 | Admitting: Family

## 2023-07-04 ENCOUNTER — Encounter: Payer: Self-pay | Admitting: Hematology and Oncology

## 2023-07-04 ENCOUNTER — Encounter (HOSPITAL_BASED_OUTPATIENT_CLINIC_OR_DEPARTMENT_OTHER): Payer: Self-pay | Admitting: Family

## 2023-07-04 VITALS — BP 124/86 | HR 76 | Ht 62.0 in | Wt 138.0 lb

## 2023-07-04 DIAGNOSIS — I10 Essential (primary) hypertension: Secondary | ICD-10-CM

## 2023-07-04 DIAGNOSIS — E7849 Other hyperlipidemia: Secondary | ICD-10-CM | POA: Diagnosis not present

## 2023-07-04 MED ORDER — REPATHA SURECLICK 140 MG/ML ~~LOC~~ SOAJ
140.0000 mg | SUBCUTANEOUS | 3 refills | Status: DC
Start: 2023-07-04 — End: 2023-07-29

## 2023-07-04 NOTE — Patient Instructions (Signed)
Medication Instructions:  Continue your current medications.   *If you need a refill on your cardiac medications before your next appointment, please call your pharmacy*  Follow-Up: At [facility], you and your health needs are our priority.  As part of our continuing mission to provide you with exceptional heart care, we have created designated Provider Care Teams.  These Care Teams include your primary Cardiologist (physician) and Advanced Practice Providers (APPs -  Physician Assistants and Nurse Practitioners) who all work together to provide you with the care you need, when you need it.  We recommend signing up for the patient portal called "MyChart".  Sign up information is provided on this After Visit Summary.  MyChart is used to connect with patients for Virtual Visits (Telemedicine).  Patients are able to view lab/test results, encounter notes, upcoming appointments, etc.  Non-urgent messages can be sent to your provider as well.   To learn more about what you can do with MyChart, go to [email or web address].    Your next appointment:   6 month(s)  Provider:   Chilton Si, MD or Gillian Shields, NP    Other Instructions

## 2023-07-04 NOTE — Progress Notes (Unsigned)
  Cardiology Office Note:  .   Date:  07/04/2023  ID:  Susan [name], DOB 01-24-1971, MRN [id number] PCP: Dorothyann Peng, MD  [facility] Providers Cardiologist:  None { Click to update primary MD,subspecialty MD or APP then REFRESH:1}   History of Present Illness: .   Susan [name] is a 52 y.o. female hypertension, familial hyperlipidemia, IDA, prediabetes, PCOS, GERD.  Established with cardiology 12/2019 for cardiovascular risk assessment due to family history of cardiovascular disease.  Rosuvastatin switched to pravastatin for myalgias.  At 1 point she had discontinued all antihypertensives however after ED visit 06/2020 with BP 170s/110s Lisinopril and hydrochlorothiazide were resumed.   Seen 09/30/22 with BP not controlled and she had stopped taking lisinopril.  She was transition to HCTZ-valsartan 12.5-160 mg daily.  Repatha was resumed.  Follow-up via virtual visit 10/2022 with LDL decreased from 1 93-39 on Repatha.  Blood pressure was uncontrolled and was bothered by nocturia.  HCTZ was discontinued.  She was recommended to take amlodipine 5 mg daily and valsartan 160 mg daily.  Amlodipine later increased to 10 mg daily.  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 LDL during admission 03/2023 LDL68.   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   Swelling periodically Heart occasionally fluttering  Still efels owrn out, tired. Notes baseline fatigue ***.  Was craving baking soda  Previous antihypertensives Valsartan - *** Hydrochlorothiazide -  Lisinopril - ***   ROS: Please see the history of present illness.    All other systems reviewed and are negative.   Studies Reviewed: .            Risk Assessment/Calculations:     No BP recorded.  {Refresh Note OR Click here to enter BP  :1}***       Physical Exam:   VS:  There were no vitals taken for this  visit.   Wt Readings from Last 3 Encounters:  06/25/23 136 lb 9.6 oz (62 kg)  05/03/23 136 lb 9.6 oz (62 kg)  04/07/23 133 lb (60.3 kg)    GEN: Well nourished, well developed in no acute distress NECK: No JVD; No carotid bruits CARDIAC: ***RRR, no murmurs, rubs, gallops RESPIRATORY:  Clear to auscultation without rales, wheezing or rhonchi  ABDOMEN: Soft, non-tender, non-distended EXTREMITIES:  No edema; No deformity   ASSESSMENT AND PLAN: .    HTN -   Familial hyperlipidemia - LDL has been as high as 226.  Did not tolerate rosuvastatin and lipids not at goal on pravastatin.    {Are you ordering a CV Procedure (e.g. stress test, cath, DCCV, TEE, etc)?   Press F2        :[id number]}  Dispo: ***  Signed, Alver Sorrow, NP

## 2023-07-17 ENCOUNTER — Inpatient Hospital Stay: Payer: BC Managed Care – PPO

## 2023-07-17 ENCOUNTER — Inpatient Hospital Stay: Payer: BC Managed Care – PPO | Attending: Hematology and Oncology | Admitting: Genetic Counselor

## 2023-07-17 NOTE — Progress Notes (Deleted)
REFERRING PROVIDER: Rachel Moulds, MD [address] [place],  [place] [address]  PRIMARY PROVIDER:  Dorothyann Peng, MD  PRIMARY REASON FOR VISIT:  ***   HISTORY OF PRESENT ILLNESS:   Susan [name], a 52 y.o. female, was seen for a [place] cancer genetics consultation at the request of Dr. Al [name] due to a family history of cancer.  Susan [name] presents to clinic today to discuss the possibility of a hereditary predisposition to cancer, to discuss genetic testing, and to further clarify her future cancer risks, as well as potential cancer risks for family members.   Susan [name] is a 52 y.o. female with no personal history of cancer.  ***  CANCER HISTORY:  Oncology History   No history exists.     RISK FACTORS:  Mammogram within the last year: yes; category b density  Number of breast biopsies: ***. Colonoscopy: yes;  most recent in 2021; f/u 61yrs . Hysterectomy: ***.  Ovaries intact: ***.  Bilateral salpingectomy at age 51.  Menarche was at age ***.  First live birth at age ***.  Menopausal status: {Menopause:31378}.  OCP use for approximately {Numbers 1-12 multi-select:20307} years.  HRT use: {Numbers 1-12 multi-select:20307} years. Dermatology screening: ***  Past Medical History:  Diagnosis Date   Anemia 2016   with bleeding ulcer   Arthritis    oa hands   Constipation    Dizziness    GERD (gastroesophageal reflux disease)    no current meds   GI bleeding    Glaucoma    Headache    headaches and migraines   History of bleeding ulcers    History of stomach ulcers    Hypertension    Hypothyroidism    Insomnia    Iron deficiency    Irregular periods    Osteoarthritis of both hands    PCOS (polycystic ovarian syndrome)    Pre-diabetes    Syncope    Vitamin D deficiency     Past Surgical History:  Procedure Laterality Date   BILATERAL SALPINGECTOMY Bilateral 03/22/2013   Procedure: BILATERAL SALPINGECTOMY;  Surgeon: Susan Kyle, MD;  Location: [facility];  Service: Gynecology;  Laterality: Bilateral;   CARPAL TUNNEL RELEASE Right 11/15/2013   Procedure: RIGHT CARPAL TUNNEL RELEASE;  Surgeon: Susan Reaper, MD;  Location: [facility];  Service: Orthopedics;  Laterality: Right;   COSMETIC SURGERY  01/2015   tummy tuck    ESOPHAGOGASTRODUODENOSCOPY N/A 10/04/2015   Procedure: ESOPHAGOGASTRODUODENOSCOPY (EGD);  Surgeon: Susan Frederick, MD;  Location: [facility];  Service: Gastroenterology;  Laterality: N/A;   GANGLION CYST EXCISION Right 11/15/2013   Procedure: EXCISION CYST RIGHT WRIST;  Surgeon: Susan Reaper, MD;  Location: [facility];  Service: Orthopedics;  Laterality: Right;   LAPAROSCOPIC GASTRIC BAND REMOVAL WITH LAPAROSCOPIC GASTRIC SLEEVE RESECTION N/A 09/23/2017   Procedure: LAPAROSCOPIC GASTRIC BAND REMOVAL WITH LAPAROSCOPIC GASTRIC SLEEVE RESECTION;  Surgeon: Susan Murphy, MD;  Location: [facility];  Service: General;  Laterality: N/A;   LAPAROSCOPIC GASTRIC BANDING  2010   LAPAROSCOPY N/A 03/22/2013   Procedure: LAPAROSCOPY OPERATIVE;  Surgeon: Susan Kyle, MD;  Location: [facility];  Service: Gynecology;  Laterality: N/A;   MOUTH SURGERY  1991   SALPINGECTOMY  03/22/2013     FAMILY HISTORY:  We obtained a detailed, 4-generation family history.  Significant diagnoses are listed below: Family History  Problem Relation Age of Onset   Hypertension Mother    Cancer Mother  pancreatic   Stroke Mother    Diabetes Mother    Diabetes gravidarum Mother    High blood pressure Mother    Heart disease Mother    Obesity Mother    Hypertension Father    Ulcers Father    Diabetes Father    Thyroid disease Father    Hypertension Brother    CAD Maternal Grandmother    Kidney cancer Maternal Grandmother    Diabetes Paternal Grandmother    Heart attack Paternal Grandfather     Susan [name] is {aware/unaware} of previous family history of genetic testing for hereditary cancer risks.  Patient's maternal ancestors are of *** descent, and paternal ancestors are of *** descent. There {IS NO:12509} reported Ashkenazi Jewish ancestry. There {IS NO:12509} known consanguinity.  GENETIC COUNSELING ASSESSMENT: Susan [name] is a 52 y.o. female with a {Personal/family:20331} history of {cancer/polyps} which is somewhat suggestive of a {DISEASE} and predisposition to cancer given ***. We, therefore, discussed and recommended the following at today's visit.   DISCUSSION: We discussed that *** - ***% of *** is hereditary, with most cases of hereditary *** cancer associated with ***.  There are other genes that can be associated with hereditary *** cancer syndromes.  These include ***.  We discussed that testing is beneficial for several reasons, including knowing about other cancer risks, identifying potential screening and risk-reduction options that may be appropriate, and to understanding if other family members could be at risk for cancer and allowing them to undergo genetic testing.  We reviewed the characteristics, features and inheritance patterns of hereditary cancer syndromes. We also discussed genetic testing, including the appropriate family members to test, the process of testing, insurance coverage and turn-around-time for results. We discussed the implications of a negative, positive, and variant of uncertain significant result. ***We discussed that negative results would be uninformative given that Susan [name] does not have a personal history of cancer. We recommended Susan [name] pursue genetic testing for a panel that contains genes associated with ***.  Susan [name] was offered a common hereditary cancer panel (48 genes) and an expanded pan-cancer panel (85 genes). Susan [name] was informed of the benefits and limitations of each panel, including that expanded pan-cancer panels contain several genes that do not have clear management guidelines at this point in time.  We also discussed  that as the number of genes included on a panel increases, the chances of variants of uncertain significance increases.  After considering the benefits and limitations of each gene panel, Susan [name] elected to have an *** through ***.   Based on Ms. Mees's {Personal/family:20331} history of cancer, she meets medical criteria for genetic testing. Despite that she meets criteria, she may still have an out of pocket cost. We discussed that if her out of pocket cost for testing is over $100, the laboratory should contact them to discuss self-pay options and/or patient pay assistance programs.   ***We reviewed the characteristics, features and inheritance patterns of hereditary cancer syndromes. We also discussed genetic testing, including the appropriate family members to test, the process of testing, insurance coverage and turn-around-time for results. We discussed the implications of a negative, positive and/or variant of uncertain significant result. In order to get genetic test results in a timely manner so that Ms. Gudino can use these genetic test results for surgical decisions, we recommended Ms. Negash pursue genetic testing for the ***. Once complete, we recommend Ms. Denbo pursue reflex genetic testing to the *** gene panel.   Based  on Ms. Baumgardner {Personal/family:20331} history of cancer, she meets medical criteria for genetic testing. Despite that she meets criteria, she may still have an out of pocket cost.   ***We discussed with Ms. Gehring that the {Personal/family:20331} history does not meet insurance or NCCN criteria for genetic testing and, therefore, is not highly consistent with a familial hereditary cancer syndrome.  We feel she is at low risk to harbor a gene mutation associated with such a condition. Thus, we did not recommend any genetic testing, at this time, and recommended Ms. Crayne continue to follow the cancer screening guidelines given by her primary healthcare  provider.  ***In order to estimate her chance of having a {CA GENE:62345} mutation, we used statistical models ({GENMODELS:62370}) that consider her personal medical history, family history and ancestry.  Because each model is different, there can be a lot of variability in the risks they give.  Therefore, these numbers must be considered a rough range and not a precise risk of having a {CA GENE:62345} mutation.  These models estimate that she has approximately a ***-***% chance of having a mutation. Based on this assessment of her family and personal history, genetic testing {IS/ISNOT:34056} recommended.  ***Based on the patient's {Personal/family:20331} history, a statistical model ({GENMODELS:62370}) was used to estimate her risk of developing {CA HX:54794}. This estimates her lifetime risk of developing {CA HX:54794} to be approximately ***%. This estimation does not consider any genetic testing results.  The patient's lifetime breast cancer risk is a preliminary estimate based on available information using one of several models endorsed by the American Cancer Society (ACS). The ACS recommends consideration of breast MRI screening as an adjunct to mammography for patients at high risk (defined as 20% or greater lifetime risk).   ***Ms. Mcclarin has been determined to be at high risk for breast cancer.  Therefore, we recommend that annual screening with mammography and breast MRI be performed.  ***begin at age 51, or 10 years prior to the age of breast cancer diagnosis in a relative (whichever is earlier).  We discussed that Ms. Saks should discuss her individual situation with her referring physician and determine a breast cancer screening plan with which they are both comfortable.    We discussed the Genetic Information Non-Discrimination Act (GINA) of 2008, which helps protect individuals against genetic discrimination based on their genetic test results.  It impacts both health insurance and  employment.  With health insurance, it protects against genetic test results being used for increased premiums or policy termination. For employment, it protects against hiring, firing and promoting decisions based on genetic test results.  GINA does not apply to those in the [employer], those who work for companies with less than 15 employees, and new life insurance or long-term disability insurance policies.  Health status due to a cancer diagnosis is not protected under GINA.  PLAN: After considering the risks, benefits, and limitations, Ms. Creed provided informed consent to pursue genetic testing and the blood sample was sent to {Lab} Laboratories for analysis of the {test}. Results should be available within approximately {TAT TIME} weeks' time, at which point they will be disclosed by telephone to Ms. Gamboa, as will any additional recommendations warranted by these results. Ms. Kowalick will receive a summary of her genetic counseling visit and a copy of her results once available. This information will also be available in Epic.   *** Despite our recommendation, Ms. Helin did not wish to pursue genetic testing at today's visit. We understand this decision  and remain available to coordinate genetic testing at any time in the future. We, therefore, recommend Ms. Weinheimer continue to follow the cancer screening guidelines given by her primary healthcare provider.  ***Based on Ms. Dax's family history, we recommended her ***, who was diagnosed with *** at age ***, have genetic counseling and testing. Ms. Luzadder will let us know if we can be of any assistance in coordinating genetic counseling and/or testing for this family member.   Lastly, we encouraged Ms. Willenberg to remain in contact with cancer genetics annually so that we can continuously update the family history and inform her of any changes in cancer genetics and testing that may be of benefit for this family.   Ms. Mareno questions  were answered to her satisfaction today. Our contact information was provided should additional questions or concerns arise. ***Thank you for the referral and allowing [place] to share in the care of your patient.   Susan Folger M. Rennie Plowman, MS, [facility] Genetic Counselor [name] (P) [phone]   The patient was seen for a total of *** minutes in face-to-face genetic counseling.  ***The was patient was accompanied by ***.  ***The patient was seen alone.  Drs. Pamelia Hoit and/or Mosetta Putt were available to discuss this case as needed.  _______________________________________________________________________ For Office Staff:  Number of people involved in session: *** Was an Intern/ student involved with case: {YES/NO:63}

## 2023-07-25 ENCOUNTER — Encounter: Payer: Self-pay | Admitting: Hematology and Oncology

## 2023-07-29 ENCOUNTER — Other Ambulatory Visit (HOSPITAL_BASED_OUTPATIENT_CLINIC_OR_DEPARTMENT_OTHER): Payer: Self-pay | Admitting: Cardiovascular Disease

## 2023-07-29 DIAGNOSIS — E7849 Other hyperlipidemia: Secondary | ICD-10-CM

## 2023-07-29 NOTE — Telephone Encounter (Signed)
Rx request sent to pharmacy.  

## 2023-08-01 ENCOUNTER — Inpatient Hospital Stay (HOSPITAL_BASED_OUTPATIENT_CLINIC_OR_DEPARTMENT_OTHER): Payer: BC Managed Care – PPO | Admitting: Hematology and Oncology

## 2023-08-01 ENCOUNTER — Inpatient Hospital Stay: Payer: BC Managed Care – PPO | Attending: Hematology and Oncology

## 2023-08-01 ENCOUNTER — Inpatient Hospital Stay: Payer: BC Managed Care – PPO

## 2023-08-01 VITALS — BP 136/87 | HR 80 | Temp 98.1°F | Resp 17 | Wt 140.8 lb

## 2023-08-01 DIAGNOSIS — L603 Nail dystrophy: Secondary | ICD-10-CM | POA: Insufficient documentation

## 2023-08-01 DIAGNOSIS — D75839 Thrombocytosis, unspecified: Secondary | ICD-10-CM | POA: Diagnosis not present

## 2023-08-01 DIAGNOSIS — R5383 Other fatigue: Secondary | ICD-10-CM | POA: Insufficient documentation

## 2023-08-01 DIAGNOSIS — Z8051 Family history of malignant neoplasm of kidney: Secondary | ICD-10-CM | POA: Insufficient documentation

## 2023-08-01 DIAGNOSIS — D62 Acute posthemorrhagic anemia: Secondary | ICD-10-CM

## 2023-08-01 DIAGNOSIS — D509 Iron deficiency anemia, unspecified: Secondary | ICD-10-CM | POA: Insufficient documentation

## 2023-08-01 DIAGNOSIS — F5089 Other specified eating disorder: Secondary | ICD-10-CM | POA: Diagnosis not present

## 2023-08-01 LAB — CBC WITH DIFFERENTIAL/PLATELET
Abs Immature Granulocytes: 0 K/uL (ref 0.00–0.07)
Basophils Absolute: 0.1 K/uL (ref 0.0–0.1)
Basophils Relative: 2 %
Eosinophils Absolute: 0.3 K/uL (ref 0.0–0.5)
Eosinophils Relative: 5 %
HCT: 33.1 % — ABNORMAL LOW (ref 36.0–46.0)
Hemoglobin: 10.4 g/dL — ABNORMAL LOW (ref 12.0–15.0)
Immature Granulocytes: 0 %
Lymphocytes Relative: 46 %
Lymphs Abs: 2.2 K/uL (ref 0.7–4.0)
MCH: 26.7 pg (ref 26.0–34.0)
MCHC: 31.4 g/dL (ref 30.0–36.0)
MCV: 85.1 fL (ref 80.0–100.0)
Monocytes Absolute: 0.5 K/uL (ref 0.1–1.0)
Monocytes Relative: 10 %
Neutro Abs: 1.8 K/uL (ref 1.7–7.7)
Neutrophils Relative %: 37 %
Platelets: 441 K/uL — ABNORMAL HIGH (ref 150–400)
RBC: 3.89 MIL/uL (ref 3.87–5.11)
RDW: 15 % (ref 11.5–15.5)
WBC: 4.7 K/uL (ref 4.0–10.5)
nRBC: 0 % (ref 0.0–0.2)

## 2023-08-01 LAB — IRON AND IRON BINDING CAPACITY (CC-WL,HP ONLY)
Iron: 19 ug/dL — ABNORMAL LOW (ref 28–170)
Saturation Ratios: 4 % — ABNORMAL LOW (ref 10.4–31.8)
TIBC: 522 ug/dL — ABNORMAL HIGH (ref 250–450)
UIBC: 503 ug/dL — ABNORMAL HIGH (ref 148–442)

## 2023-08-01 LAB — FERRITIN: Ferritin: 6 ng/mL — ABNORMAL LOW (ref 11–307)

## 2023-08-01 NOTE — Progress Notes (Signed)
[facility] CONSULT NOTE  Patient Care Team: Dorothyann Peng, MD as PCP - General (Internal Medicine)  CHIEF COMPLAINTS/PURPOSE OF CONSULTATION:  IDA  ASSESSMENT & PLAN:   This is a very pleasant 52 yr old female patient with past medical history significant for gastric ulcers back in 2018 when she had acute blood loss anemia most recently was found to have severe fatigue, lightheadedness and hence requested some labs with her cardiologist.  This showed severe anemia and hence she went to the emergency room, took blood transfusion and iron infusion. She is not on oral iron supplementation, didn't need IV iron outpatient. No PICA, no light headedness, fatigue has improved. She has noted brittle nails. Menstrual cycles are better, but still on the heavier side. No concerns on physical exam.  On CBC today, hemoglobin has exactly remained stable, thrombocytosis noted.  Iron panel and ferritin pending.  At this time I have recommended that we wait on iron panel and ferritin and consider oral iron supplementation if needed.  She is agreeable to all the recommendations. She will otherwise return to clinic in approximately 4 months or sooner as needed. HISTORY OF PRESENTING ILLNESS:  Pamlea [name] 52 y.o. female is here because of IDA  This is a very pleasant 52 yr old female patient with PMH significant for gastric ulcer disease in 2018 referred to hematology for IDA. She says she has been fatigued for several months and felt light headed as well. She happened to be light headed while she was talking to her cardiologist outside the clinic ( the doc happens to be friend) and then had out patient labs which showed severe anemia. She was noted to have severe anemia, and sent to the ED. In the ED, she got blood transfusion and Iron infusion. She also noted PICA, loves eat baking soda and cold water. In the past week, she felt better, but once again lethargic. No hematochezia or melena. No  unusual menstruation, menstruates regularly.  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  She otherwise feels well. She is not taking any iron supplements she says.  Interval History  Since her last visit here, she has not taken any oral iron supplementation but continues to feel well.  Fatigue has improved, pica has improved.  No shortness of breath, no lightheadedness.  She has still noted brittle nails.  Both her kids are now off to college, her summer was busy.  Rest of the pertinent 10 point ROS reviewed and neg.  MEDICAL HISTORY:  Past Medical History:  Diagnosis Date   Anemia 2016   with bleeding ulcer   Arthritis    oa hands   Constipation    Dizziness    GERD (gastroesophageal reflux disease)    no current meds   GI bleeding    Glaucoma    Headache    headaches and migraines   History of bleeding ulcers    History of stomach ulcers    Hypertension    Hypothyroidism    Insomnia    Iron deficiency    Irregular periods    Osteoarthritis of both hands    PCOS (polycystic ovarian syndrome)    Pre-diabetes    Syncope    Vitamin D deficiency     SURGICAL HISTORY: Past Surgical History:  Procedure Laterality Date   BILATERAL SALPINGECTOMY Bilateral 03/22/2013   Procedure: BILATERAL SALPINGECTOMY;  Surgeon: Serita Kyle, MD;  Location: [facility];  Service: Gynecology;  Laterality: Bilateral;   CARPAL TUNNEL  RELEASE Right 11/15/2013   Procedure: RIGHT CARPAL TUNNEL RELEASE;  Surgeon: Nicki Reaper, MD;  Location: [facility];  Service: Orthopedics;  Laterality: Right;   COSMETIC SURGERY  01/2015   tummy tuck    ESOPHAGOGASTRODUODENOSCOPY N/A 10/04/2015   Procedure: ESOPHAGOGASTRODUODENOSCOPY (EGD);  Surgeon: Ruffin Frederick, MD;  Location: [facility];  Service: Gastroenterology;  Laterality: N/A;   GANGLION CYST EXCISION Right 11/15/2013   Procedure: EXCISION CYST RIGHT WRIST;  Surgeon: Nicki Reaper, MD;  Location: [facility];   Service: Orthopedics;  Laterality: Right;   LAPAROSCOPIC GASTRIC BAND REMOVAL WITH LAPAROSCOPIC GASTRIC SLEEVE RESECTION N/A 09/23/2017   Procedure: LAPAROSCOPIC GASTRIC BAND REMOVAL WITH LAPAROSCOPIC GASTRIC SLEEVE RESECTION;  Surgeon: Luretha Murphy, MD;  Location: [facility];  Service: General;  Laterality: N/A;   LAPAROSCOPIC GASTRIC BANDING  2010   LAPAROSCOPY N/A 03/22/2013   Procedure: LAPAROSCOPY OPERATIVE;  Surgeon: Serita Kyle, MD;  Location: [facility];  Service: Gynecology;  Laterality: N/A;   MOUTH SURGERY  1991   SALPINGECTOMY  03/22/2013    SOCIAL HISTORY: Social History   Socioeconomic History   Marital status: Married    Spouse name: Marcelle   Number of children: Not on file   Years of education: Not on file   Highest education level: Not on file  Occupational History   Occupation: Pediatric Dentist  Tobacco Use   Smoking status: Never   Smokeless tobacco: Never  Vaping Use   Vaping status: Never Used  Substance and Sexual Activity   Alcohol use: Yes    Alcohol/week: 1.0 standard drink of alcohol    Types: 1 Standard drinks or equivalent per week    Comment: socially   Drug use: No   Sexual activity: Not on file  Other Topics Concern   Not on file  Social History Narrative   Not on file   Social Determinants of Health   Financial Resource Strain: Not on file  Food Insecurity: No Food Insecurity (04/03/2023)   Hunger Vital Sign    Worried About Running Out of Food in the Last Year: Never true    Ran Out of Food in the Last Year: Never true  Transportation Needs: No Transportation Needs (04/03/2023)   PRAPARE - Administrator, Civil Service (Medical): No    Lack of Transportation (Non-Medical): No  Physical Activity: Not on file  Stress: Not on file  Social Connections: Not on file  Intimate Partner Violence: Not At Risk (04/03/2023)   Humiliation, Afraid, Rape, and Kick questionnaire    Fear of Current or Ex-Partner: No    Emotionally  Abused: No    Physically Abused: No    Sexually Abused: No    FAMILY HISTORY: Family History  Problem Relation Age of Onset   Hypertension Mother    Cancer Mother        pancreatic   Stroke Mother    Diabetes Mother    Diabetes gravidarum Mother    High blood pressure Mother    Heart disease Mother    Obesity Mother    Hypertension Father    Ulcers Father    Diabetes Father    Thyroid disease Father    Hypertension Brother    CAD Maternal Grandmother    Kidney cancer Maternal Grandmother    Diabetes Paternal Grandmother    Heart attack Paternal Grandfather     ALLERGIES:  is allergic to crestor [rosuvastatin].  MEDICATIONS:  Current Outpatient Medications  Medication  Sig Dispense Refill   amLODipine (NORVASC) 10 MG tablet Take 10 mg by mouth daily.     escitalopram (LEXAPRO) 10 MG tablet Take 1 tablet (10 mg total) by mouth daily. (Patient not taking: Reported on 07/04/2023) 30 tablet 1   eszopiclone 3 MG TABS Take 1 tablet (3 mg total) by mouth at bedtime as needed. Take immediately before bedtime (Patient not taking: Reported on 07/04/2023) 30 tablet 1   Evolocumab (REPATHA SURECLICK) 140 MG/ML SOAJ INJECT 140MG  UNDER THE SKIN EVERY 14 DAYS 2 mL 6   Iron-FA-B Cmp-C-Biot-Probiotic (FUSION PLUS) CAPS One capsule po qd 30 capsule 4   omeprazole (PRILOSEC) 20 MG capsule Take 20 mg by mouth daily.     No current facility-administered medications for this visit.     PHYSICAL EXAMINATION: ECOG PERFORMANCE STATUS: 0 - Asymptomatic  Vitals:   08/01/23 0913  BP: 136/87  Pulse: 80  Resp: 17  Temp: 98.1 F (36.7 C)  SpO2: 100%   Filed Weights   08/01/23 0913  Weight: 140 lb 12.8 oz (63.9 kg)    GENERAL:alert, no distress and comfortable SKIN: skin color, texture, turgor are normal,  NECK: supple, LYMPH:  no palpable lymphadenopathy in the cervical, axillary  LUNGS: clear to auscultation and percussion with normal breathing effort HEART: regular rate & rhythm and no  murmurs and no lower extremity edema ABDOMEN:abdomen soft, non-tender and normal bowel sounds Musculoskeletal:no cyanosis of digits PSYCH: alert & oriented x 3 with fluent speech NEURO: no focal motor/sensory deficits  LABORATORY DATA:  I have reviewed the data as listed Lab Results  Component Value Date   WBC 4.7 08/01/2023   HGB 10.4 (L) 08/01/2023   HCT 33.1 (L) 08/01/2023   MCV 85.1 08/01/2023   PLT 441 (H) 08/01/2023     Chemistry      Component Value Date/Time   NA 136 04/05/2023 0655   NA 140 03/19/2023 1647   K 3.6 04/05/2023 0655   CL 104 04/05/2023 0655   CO2 22 04/05/2023 0655   BUN 9 04/05/2023 0655   BUN 10 03/19/2023 1647   CREATININE 0.80 04/05/2023 0655      Component Value Date/Time   CALCIUM 8.4 (L) 04/05/2023 0655   ALKPHOS 54 04/05/2023 0655   AST 23 04/05/2023 0655   ALT 18 04/05/2023 0655   BILITOT 0.8 04/05/2023 0655   BILITOT 0.4 03/19/2023 1647       RADIOGRAPHIC STUDIES: I have personally reviewed the radiological images as listed and agreed with the findings in the report. No results found.  All questions were answered. The patient knows to call the clinic with any problems, questions or concerns. I spent 20 minutes in the care of this patient including H and P, review of records, counseling and coordination of care.     Rachel Moulds, MD 08/01/2023 9:15 AM   Hypertension

## 2023-08-04 ENCOUNTER — Telehealth: Payer: Self-pay

## 2023-08-04 NOTE — Telephone Encounter (Signed)
Attempted to call pt regarding follow-up on lab results. VM box full.

## 2023-08-06 ENCOUNTER — Ambulatory Visit: Payer: BC Managed Care – PPO | Admitting: Internal Medicine

## 2023-08-06 ENCOUNTER — Encounter: Payer: Self-pay | Admitting: Internal Medicine

## 2023-08-06 VITALS — BP 110/74 | HR 97 | Temp 99.1°F | Ht 62.0 in | Wt 141.6 lb

## 2023-08-06 DIAGNOSIS — F419 Anxiety disorder, unspecified: Secondary | ICD-10-CM

## 2023-08-06 DIAGNOSIS — Z111 Encounter for screening for respiratory tuberculosis: Secondary | ICD-10-CM

## 2023-08-06 DIAGNOSIS — E7800 Pure hypercholesterolemia, unspecified: Secondary | ICD-10-CM

## 2023-08-06 DIAGNOSIS — Z23 Encounter for immunization: Secondary | ICD-10-CM | POA: Diagnosis not present

## 2023-08-06 DIAGNOSIS — I119 Hypertensive heart disease without heart failure: Secondary | ICD-10-CM | POA: Insufficient documentation

## 2023-08-06 DIAGNOSIS — F5102 Adjustment insomnia: Secondary | ICD-10-CM

## 2023-08-06 NOTE — Patient Instructions (Signed)
Hypertension, Adult Hypertension is another name for high blood pressure. High blood pressure forces your heart to work harder to pump blood. This can cause problems over time. There are two numbers in a blood pressure reading. There is a top number (systolic) over a bottom number (diastolic). It is best to have a blood pressure that is below 120/80. What are the causes? The cause of this condition is not known. Some other conditions can lead to high blood pressure. What increases the risk? Some lifestyle factors can make you more likely to develop high blood pressure: Smoking. Not getting enough exercise or physical activity. Being overweight. Having too much fat, sugar, calories, or salt (sodium) in your diet. Drinking too much alcohol. Other risk factors include: Having any of these conditions: Heart disease. Diabetes. High cholesterol. Kidney disease. Obstructive sleep apnea. Having a family history of high blood pressure and high cholesterol. Age. The risk increases with age. Stress. What are the signs or symptoms? High blood pressure may not cause symptoms. Very high blood pressure (hypertensive crisis) may cause: Headache. Fast or uneven heartbeats (palpitations). Shortness of breath. Nosebleed. Vomiting or feeling like you may vomit (nauseous). Changes in how you see. Very bad chest pain. Feeling dizzy. Seizures. How is this treated? This condition is treated by making healthy lifestyle changes, such as: Eating healthy foods. Exercising more. Drinking less alcohol. Your doctor may prescribe medicine if lifestyle changes do not help enough and if: Your top number is above 130. Your bottom number is above 80. Your personal target blood pressure may vary. Follow these instructions at home: Eating and drinking  If told, follow the DASH eating plan. To follow this plan: Fill one half of your plate at each meal with fruits and vegetables. Fill one fourth of your plate  at each meal with whole grains. Whole grains include whole-wheat pasta, brown rice, and whole-grain bread. Eat or drink low-fat dairy products, such as skim milk or low-fat yogurt. Fill one fourth of your plate at each meal with low-fat (lean) proteins. Low-fat proteins include fish, chicken without skin, eggs, beans, and tofu. Avoid fatty meat, cured and processed meat, or chicken with skin. Avoid pre-made or processed food. Limit the amount of salt in your diet to less than 1,500 mg each day. Do not drink alcohol if: Your doctor tells you not to drink. You are pregnant, may be pregnant, or are planning to become pregnant. If you drink alcohol: Limit how much you have to: 0-1 drink a day for women. 0-2 drinks a day for men. Know how much alcohol is in your drink. In the [place] one drink equals one 12 oz bottle of beer (355 mL), one 5 oz glass of wine (148 mL), or one 1 oz glass of hard liquor (44 mL). Lifestyle  Work with your doctor to stay at a healthy weight or to lose weight. Ask your doctor what the best weight is for you. Get at least 30 minutes of exercise that causes your heart to beat faster (aerobic exercise) most days of the week. This may include walking, swimming, or biking. Get at least 30 minutes of exercise that strengthens your muscles (resistance exercise) at least 3 days a week. This may include lifting weights or doing Pilates. Do not smoke or use any products that contain nicotine or tobacco. If you need help quitting, ask your doctor. Check your blood pressure at home as told by your doctor. Keep all follow-up visits. Medicines Take over-the-counter and prescription medicines   only as told by your doctor. Follow directions carefully. Do not skip doses of blood pressure medicine. The medicine does not work as well if you skip doses. Skipping doses also puts you at risk for problems. Ask your doctor about side effects or reactions to medicines that you should watch  for. Contact a doctor if: You think you are having a reaction to the medicine you are taking. You have headaches that keep coming back. You feel dizzy. You have swelling in your ankles. You have trouble with your vision. Get help right away if: You get a very bad headache. You start to feel mixed up (confused). You feel weak or numb. You feel faint. You have very bad pain in your: Chest. Belly (abdomen). You vomit more than once. You have trouble breathing. These symptoms may be an emergency. Get help right away. Call 911. Do not wait to see if the symptoms will go away. Do not drive yourself to the hospital. Summary Hypertension is another name for high blood pressure. High blood pressure forces your heart to work harder to pump blood. For most people, a normal blood pressure is less than 120/80. Making healthy choices can help lower blood pressure. If your blood pressure does not get lower with healthy choices, you may need to take medicine. This information is not intended to replace advice given to you by your health care provider. Make sure you discuss any questions you have with your health care provider. Document Revised: 08/30/2021 Document Reviewed: 08/30/2021 Elsevier Patient Education  2024 Elsevier Inc.  

## 2023-08-06 NOTE — Progress Notes (Signed)
I,Susan [name], CMA,acting as a Neurosurgeon for Susan Aliment, MD.,have documented all relevant documentation on the behalf of Susan Aliment, MD,as directed by  Susan Aliment, MD while in the presence of Susan Aliment, MD.  Subjective:  Patient ID: Susan [name] , female    DOB: [birth year] , 52 y.o.   MRN: [id number]  Chief Complaint  Patient presents with   Anxiety   Hypertension    HPI  Patient presents today for anxiety follow. Reports no headache, chest pain, or SOB. Pt admits she is no longer taking Lexapro 10 MG. She is taking Lunesta as needed. No further questions or concerns.  She would like to complete TB lab test for her job.        Past Medical History:  Diagnosis Date   Anemia 2016   with bleeding ulcer   Arthritis    oa hands   Constipation    Dizziness    GERD (gastroesophageal reflux disease)    no current meds   GI bleeding    Glaucoma    Headache    headaches and migraines   History of bleeding ulcers    History of stomach ulcers    Hypertension    Hypothyroidism    Insomnia    Iron deficiency    Irregular periods    Osteoarthritis of both hands    PCOS (polycystic ovarian syndrome)    Pre-diabetes    Syncope    Vitamin D deficiency      Family History  Problem Relation Age of Onset   Hypertension Mother    Cancer Mother        pancreatic   Stroke Mother    Diabetes Mother    Diabetes gravidarum Mother    High blood pressure Mother    Heart disease Mother    Obesity Mother    Hypertension Father    Ulcers Father    Diabetes Father    Thyroid disease Father    Hypertension Brother    CAD Maternal Grandmother    Kidney cancer Maternal Grandmother    Diabetes Paternal Grandmother    Heart attack Paternal Grandfather      Current Outpatient Medications:    amLODipine (NORVASC) 10 MG tablet, Take 10 mg by mouth daily., Disp: , Rfl:    Evolocumab (REPATHA SURECLICK) 140 MG/ML SOAJ, INJECT 140MG  UNDER THE SKIN EVERY 14  DAYS, Disp: 2 mL, Rfl: 6   omeprazole (PRILOSEC) 20 MG capsule, Take 20 mg by mouth daily., Disp: , Rfl:    eszopiclone 3 MG TABS, Take 1 tablet (3 mg total) by mouth at bedtime as needed. Take immediately before bedtime (Patient not taking: Reported on 07/04/2023), Disp: 30 tablet, Rfl: 1   Iron-FA-B Cmp-C-Biot-Probiotic (FUSION PLUS) CAPS, One capsule po qd (Patient not taking: Reported on 08/06/2023), Disp: 30 capsule, Rfl: 4   Allergies  Allergen Reactions   Crestor [Rosuvastatin]     Myalgias      Review of Systems  Constitutional: Negative.   Respiratory: Negative.    Cardiovascular: Negative.   Gastrointestinal: Negative.   Musculoskeletal: Negative.   Neurological: Negative.   Psychiatric/Behavioral: Negative.       Today's Vitals   08/06/23 1522  BP: 110/74  Pulse: 97  Temp: 99.1 F (37.3 C)  SpO2: 98%  Weight: 141 lb 9.6 oz (64.2 kg)  Height: 5\' 2"  (1.575 m)   Body mass index is 25.9 kg/m.  Wt Readings from Last 3 Encounters:  08/06/23  141 lb 9.6 oz (64.2 kg)  08/01/23 140 lb 12.8 oz (63.9 kg)  07/04/23 138 lb (62.6 kg)    BP Readings from Last 3 Encounters:  08/06/23 110/74  08/01/23 136/87  07/04/23 124/86     Objective:  Physical Exam Vitals and nursing note reviewed.  Constitutional:      Appearance: Normal appearance.  HENT:     Head: Normocephalic and atraumatic.  Eyes:     Extraocular Movements: Extraocular movements intact.  Cardiovascular:     Rate and Rhythm: Normal rate and regular rhythm.     Heart sounds: Normal heart sounds.  Pulmonary:     Effort: Pulmonary effort is normal.     Breath sounds: Normal breath sounds.  Musculoskeletal:     Cervical back: Normal range of motion.  Skin:    General: Skin is warm.  Neurological:     General: No focal deficit present.     Mental Status: She is alert.  Psychiatric:        Mood and Affect: Mood normal.        Behavior: Behavior normal.         Assessment And Plan:   Anxiety Assessment & Plan: Sx have improved since she is no longer working in stressful environment.  She is happy to report that she is starting a new job soon.    Adjustment insomnia Assessment & Plan: Chronic, improved with use of Lunesta prn. Encouraged to develop a bedtime routine.    Immunization due -     Flu vaccine trivalent PF, 6mos and older(Flulaval,Afluria,Fluarix,Fluzone)  Screening for tuberculosis -     QuantiFERON-TB Gold Plus     Return if symptoms worsen or fail to improve.  Patient was given opportunity to ask questions. Patient verbalized understanding of the plan and was able to repeat key elements of the plan. All questions were answered to their satisfaction.    I, Susan Aliment, MD, have reviewed all documentation for this visit. The documentation on 08/06/23 for the exam, diagnosis, procedures, and orders are all accurate and complete.   IF YOU HAVE BEEN REFERRED TO A SPECIALIST, IT MAY TAKE 1-2 WEEKS TO SCHEDULE/PROCESS THE REFERRAL. IF YOU HAVE NOT HEARD FROM US/SPECIALIST IN TWO WEEKS, PLEASE GIVE [place] A CALL AT [phone].   THE PATIENT IS ENCOURAGED TO PRACTICE SOCIAL DISTANCING DUE TO THE COVID-19 PANDEMIC.

## 2023-08-09 LAB — QUANTIFERON-TB GOLD PLUS
QuantiFERON Mitogen Value: >10 [IU]/mL
QuantiFERON Nil Value: 0.01 [IU]/mL
QuantiFERON TB1 Ag Value: 0.02 [IU]/mL
QuantiFERON TB2 Ag Value: 0.01 [IU]/mL
QuantiFERON-TB Gold Plus: NEGATIVE

## 2023-08-16 NOTE — Assessment & Plan Note (Signed)
Sx have improved since she is no longer working in stressful environment.  She is happy to report that she is starting a new job soon.

## 2023-08-16 NOTE — Assessment & Plan Note (Signed)
Chronic, improved with use of Lunesta prn. Encouraged to develop a bedtime routine.

## 2023-08-20 NOTE — Telephone Encounter (Signed)
Patient seen 07/04/23, removing from basket

## 2023-09-17 ENCOUNTER — Encounter: Payer: Self-pay | Admitting: Hematology and Oncology

## 2023-10-02 ENCOUNTER — Telehealth (HOSPITAL_BASED_OUTPATIENT_CLINIC_OR_DEPARTMENT_OTHER): Payer: Self-pay | Admitting: *Deleted

## 2023-10-02 DIAGNOSIS — Z5181 Encounter for therapeutic drug level monitoring: Secondary | ICD-10-CM

## 2023-10-02 DIAGNOSIS — E7849 Other hyperlipidemia: Secondary | ICD-10-CM

## 2023-10-02 MED ORDER — VALSARTAN-HYDROCHLOROTHIAZIDE 160-12.5 MG PO TABS: 1.0000 | ORAL_TABLET | Freq: Every day | ORAL | 1 refills | Status: DC

## 2023-10-02 NOTE — Telephone Encounter (Signed)
Nicole Cella. can you please send in valsartan/hydrochlorothiazide 160/12.5mg  for her. BMP in 1 week. and f/u with me, Belenda Cruise or Luther Parody in a monthish or whenever you can get her in   Above message from Dr Duke Salvia received via secure chat   Rx sent to pharmacy and lab order placed   Reviewed with patient and scheduled visit

## 2023-10-09 ENCOUNTER — Encounter: Payer: Self-pay | Admitting: Internal Medicine

## 2023-10-09 NOTE — Progress Notes (Deleted)
I,Konstance Happel T Deloria Lair, CMA,acting as a Neurosurgeon for [name], MD.,have documented all relevant documentation on the behalf of [name], MD,as directed by  [name], MD while in the presence of [name], MD.  Subjective:    Patient ID: Susan [name] , female    DOB: [birth year] , 52 y.o.   MRN: [id number]  No chief complaint on file.   HPI  Patient presents today for annual exam. She reports compliance with medications. Denies headache, chest pain & sob.      Past Medical History:  Diagnosis Date   Anemia 2016   with bleeding ulcer   Arthritis    oa hands   Constipation    Dizziness    GERD (gastroesophageal reflux disease)    no current meds   GI bleeding    Glaucoma    Headache    headaches and migraines   History of bleeding ulcers    History of stomach ulcers    Hypertension    Hypothyroidism    Insomnia    Iron deficiency    Irregular periods    Osteoarthritis of both hands    PCOS (polycystic ovarian syndrome)    Pre-diabetes    Syncope    Vitamin D deficiency      Family History  Problem Relation Age of Onset   Hypertension Mother    Cancer Mother        pancreatic   Stroke Mother    Diabetes Mother    Diabetes gravidarum Mother    High blood pressure Mother    Heart disease Mother    Obesity Mother    Hypertension Father    Ulcers Father    Diabetes Father    Thyroid disease Father    Hypertension Brother    CAD Maternal Grandmother    Kidney cancer Maternal Grandmother    Diabetes Paternal Grandmother    Heart attack Paternal Grandfather      Current Outpatient Medications:    amLODipine (NORVASC) 10 MG tablet, Take 10 mg by mouth daily., Disp: , Rfl:    eszopiclone 3 MG TABS, Take 1 tablet (3 mg total) by mouth at bedtime as needed. Take immediately before bedtime (Patient not taking: Reported on 07/04/2023), Disp: 30 tablet, Rfl: 1   Evolocumab (REPATHA SURECLICK) 140 MG/ML SOAJ, INJECT 140MG  UNDER THE SKIN EVERY 14  DAYS, Disp: 2 mL, Rfl: 6   Iron-FA-B Cmp-C-Biot-Probiotic (FUSION PLUS) CAPS, One capsule po qd (Patient not taking: Reported on 08/06/2023), Disp: 30 capsule, Rfl: 4   omeprazole (PRILOSEC) 20 MG capsule, Take 20 mg by mouth daily., Disp: , Rfl:    valsartan-hydrochlorothiazide (DIOVAN HCT) 160-12.5 MG tablet, Take 1 tablet by mouth daily., Disp: 90 tablet, Rfl: 1   Allergies  Allergen Reactions   Crestor [Rosuvastatin]     Myalgias       The patient states she uses {contraceptive methods:5051} for birth control. No LMP recorded. (Menstrual status: IUD).. {Dysmenorrhea-menorrhagia:21918}. Negative for: breast discharge, breast lump(s), breast pain and breast self exam. Associated symptoms include abnormal vaginal bleeding. Pertinent negatives include abnormal bleeding (hematology), anxiety, decreased libido, depression, difficulty falling sleep, dyspareunia, history of infertility, nocturia, sexual dysfunction, sleep disturbances, urinary incontinence, urinary urgency, vaginal discharge and vaginal itching. Diet regular.The patient states her exercise level is    . The patient's tobacco use is:  Social History   Tobacco Use  Smoking Status Never  Smokeless Tobacco Never  . She has been exposed to passive  smoke. The patient's alcohol use is:  Social History   Substance and Sexual Activity  Alcohol Use Yes   Alcohol/week: 1.0 standard drink of alcohol   Types: 1 Standard drinks or equivalent per week   Comment: socially  . Additional information: Last pap ***, next one scheduled for ***.    Review of Systems  Constitutional: Negative.   HENT: Negative.    Eyes: Negative.   Respiratory: Negative.    Cardiovascular: Negative.   Gastrointestinal: Negative.   Endocrine: Negative.   Genitourinary: Negative.   Musculoskeletal: Negative.   Skin: Negative.   Allergic/Immunologic: Negative.   Neurological: Negative.   Hematological: Negative.   Psychiatric/Behavioral: Negative.        There were no vitals filed for this visit. There is no height or weight on file to calculate BMI.  Wt Readings from Last 3 Encounters:  08/06/23 141 lb 9.6 oz (64.2 kg)  08/01/23 140 lb 12.8 oz (63.9 kg)  07/04/23 138 lb (62.6 kg)     Objective:  Physical Exam      Assessment And Plan:     There are no diagnoses linked to this encounter.   No follow-ups on file. Patient was given opportunity to ask questions. Patient verbalized understanding of the plan and was able to repeat key elements of the plan. All questions were answered to their satisfaction.   [name], MD  I, [name], MD, have reviewed all documentation for this visit. The documentation on 10/09/23 for the exam, diagnosis, procedures, and orders are all accurate and complete.

## 2023-10-28 ENCOUNTER — Ambulatory Visit (HOSPITAL_BASED_OUTPATIENT_CLINIC_OR_DEPARTMENT_OTHER): Payer: BC Managed Care – PPO | Admitting: Cardiovascular Disease

## 2023-10-31 ENCOUNTER — Ambulatory Visit
Payer: BC Managed Care – PPO | Attending: Cardiology | Admitting: Pharmacist Clinician (PhC)/ Clinical Pharmacy Specialist

## 2023-10-31 ENCOUNTER — Encounter: Payer: Self-pay | Admitting: Pharmacist Clinician (PhC)/ Clinical Pharmacy Specialist

## 2023-10-31 VITALS — BP 158/98 | HR 78

## 2023-10-31 DIAGNOSIS — I10 Essential (primary) hypertension: Secondary | ICD-10-CM

## 2023-10-31 MED ORDER — VALSARTAN-HYDROCHLOROTHIAZIDE 320-12.5 MG PO TABS: 1.0000 | ORAL_TABLET | Freq: Every day | ORAL | 3 refills | Status: DC

## 2023-10-31 NOTE — Assessment & Plan Note (Addendum)
Assessment: BP is uncontrolled in office BP 158/98 mmHg;  above the goal (<130/80). Tolerates valsartan hctz well without any side effects Denies SOB, palpitation, chest pain, headaches,or swelling Reiterated the importance of regular exercise and low salt diet   Plan:  Stop taking valsartan hctz 160/12.5 mg once daily Start taking valsartan hctz 320/12.5 mg once daily Patient encouraged to keep record of BP readings with heart rate and report to [place] at the next visit Patient to follow up with PharmD in January    Labs ordered today:  BMET today

## 2023-10-31 NOTE — Progress Notes (Signed)
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 female patient of Dr Duke Salvia, in the office today for hypertension management.  She was most recently seen in August by Gillian Shields, and found to have controlled BP on amlodipine 10 mg daily.   She called in after that to report the amlodipine was causing her ankles to swell.  It was stopped and she was given valsartan hctz 160/12.5.    Today she is in the office for follow up.  Admits she has not done a good job of checking her BP at home, but did have it checked at work once or twice.  No concerns about the valsartan/hctz.  Notes that the swelling went down within days of switching medications.    Blood Pressure Goal:  130/80  Current Medications:  valsartan hctz 160/12.5  Previously tried:  amlodipine - 10 mg caused LEE  Family Hx: father had elevated cholesterol though no ASCVD, hypertension; pgf died from MI; mother with stroke, died at 64; one brother, reently hospitalized for hyeprtensive emergency; 2 children - healthy  Social Hx:      Tobacco: no  Alcohol: durrently daily  Caffeine: no regular caffeine  Diet:  mix of home and eating out, no fast food, more sit down restaurnats; variety of protien - keeps high protein ; enjoys littles salty snack in evening  Exercise: none  Home BP readings:  none with her today    Accessory Clinical Findings    Lab Results  Component Value Date   CREATININE 0.80 04/05/2023   BUN 9 04/05/2023   NA 136 04/05/2023   K 3.6 04/05/2023   CL 104 04/05/2023   CO2 22 04/05/2023    Lab Results  Component Value Date   ALT 18 04/05/2023   AST 23 04/05/2023   ALKPHOS 54 04/05/2023   BILITOT 0.8 04/05/2023   Lab Results  Component Value Date   HGBA1C 5.9 (H) 04/02/2023    Home Medications    Current Outpatient Medications  Medication Sig Dispense Refill   valsartan-hydrochlorothiazide (DIOVAN-HCT) 320-12.5 MG tablet Take 1 tablet by mouth daily. 90 tablet 3   amLODipine (NORVASC) 10 MG tablet Take 10 mg by mouth daily.     eszopiclone 3 MG TABS Take 1 tablet (3 mg total) by mouth at bedtime as needed. Take immediately before bedtime (Patient not taking: Reported on 07/04/2023) 30 tablet 1   Evolocumab (REPATHA SURECLICK) 140 MG/ML SOAJ INJECT 140MG  UNDER THE SKIN EVERY 14 DAYS 2 mL 6   Iron-FA-B Cmp-C-Biot-Probiotic (FUSION PLUS) CAPS One capsule po qd (Patient not taking: Reported on 08/06/2023) 30 capsule 4   omeprazole (PRILOSEC) 20 MG capsule Take 20 mg by mouth daily.     No current facility-administered medications for this visit.     HYPERTENSION CONTROL Vitals:   10/31/23 0858 10/31/23 0903  BP: (!) 156/102 (!) 158/98    The patient's blood pressure is elevated above target today. {Click here if intervention needs to be changed Refresh Note :1}  In order  to address the patient's elevated BP: A current anti-hypertensive medication was adjusted today.; Blood pressure will be monitored at home to determine if medication changes need to be made.      Assessment & Plan    Essential hypertension Assessment: BP is uncontrolled in office BP 158/98 mmHg;  above the goal (<130/80). Tolerates valsartan hctz well without any side effects Denies SOB, palpitation, chest pain, headaches,or swelling Reiterated the importance of regular exercise and low salt diet   Plan:  Stop taking valsartan hctz 160/12.5 mg once daily Start taking valsartan hctz 320/12.5 mg once daily Patient encouraged to keep record of BP readings with heart rate and report to [place] at  the next visit Patient to follow up with PharmD in January    Labs ordered today:  BMET today   Phillips Hay PharmD CPP [facility]  [address] [place], [place] [address] [phone]

## 2023-10-31 NOTE — Patient Instructions (Signed)
Follow up appointment: Friday January 10 at 11:30 am  Go to the lab today to check kidney function  Take your BP meds as follows:  Stop you current prescription of valsartan/hctz 160/12.5 and start the valsartan hctz 320/12.5 mg instead  Check your blood pressure at home daily (if able) and keep record of the readings.  Your blood pressure goal is < 130/80  To check your pressure at home you will need to:  1. Sit up in a chair, with feet flat on the floor and back supported. Do not cross your ankles or legs. 2. Rest your left arm so that the cuff is about heart level. If the cuff goes on your upper arm,  then just relax the arm on the table, arm of the chair or your lap. If you have a wrist cuff, we  suggest relaxing your wrist against your chest (think of it as Pledging the Flag with the  wrong arm).  3. Place the cuff snugly around your arm, about 1 inch above the crook of your elbow. The  cords should be inside the groove of your elbow.  4. Sit quietly, with the cuff in place, for about 5 minutes. After that 5 minutes press the power  button to start a reading. 5. Do not talk or move while the reading is taking place.  6. Record your readings on a sheet of paper. Although most cuffs have a memory, it is often  easier to see a pattern developing when the numbers are all in front of you.  7. You can repeat the reading after 1-3 minutes if it is recommended  Make sure your bladder is empty and you have not had caffeine or tobacco within the last 30 min  Always bring your blood pressure log with you to your appointments. If you have not brought your monitor in to be double checked for accuracy, please bring it to your next appointment.  You can find a list of quality blood pressure cuffs at [email or web address]  Important lifestyle changes to control high blood pressure  Intervention  Effect on the BP  Lose extra pounds and watch your waistline Weight loss is one of the most effective  lifestyle changes for controlling blood pressure. If you're overweight or obese, losing even a small amount of weight can help reduce blood pressure. Blood pressure might go down by about 1 millimeter of mercury (mm Hg) with each kilogram (about 2.2 pounds) of weight lost.  Exercise regularly As a general goal, aim for at least 30 minutes of moderate physical activity every day. Regular physical activity can lower high blood pressure by about 5 to 8 mm Hg.  Eat a healthy diet Eating a diet rich in whole grains, fruits, vegetables, and low-fat dairy products and low in saturated fat and cholesterol. A healthy diet can lower high blood pressure by up to 11 mm Hg.  Reduce salt (sodium) in your diet Even a small reduction of sodium in the diet can improve heart health and reduce high blood pressure by about 5 to 6 mm Hg.  Limit alcohol One drink equals 12 ounces of beer, 5 ounces of wine, or 1.5 ounces of 80-proof liquor.  Limiting alcohol to less than one drink a day for women or two drinks a day for men can help lower blood pressure by about 4 mm Hg.   If you have any questions or concerns please use My Chart to send questions or call the office at [phone]

## 2023-11-01 LAB — BASIC METABOLIC PANEL
BUN/Creatinine Ratio: 13 (ref 9–23)
BUN: 10 mg/dL (ref 6–24)
CO2: 35 mmol/L — ABNORMAL HIGH (ref 20–29)
Calcium: 8.9 mg/dL (ref 8.7–10.2)
Chloride: 98 mmol/L (ref 96–106)
Creatinine, Ser: 0.79 mg/dL (ref 0.57–1.00)
Glucose: 85 mg/dL (ref 70–99)
Potassium: 4 mmol/L (ref 3.5–5.2)
Sodium: 142 mmol/L (ref 134–144)
eGFR: 91 mL/min/{1.73_m2} (ref >59)

## 2023-11-24 ENCOUNTER — Encounter: Payer: Self-pay | Admitting: Hematology and Oncology

## 2023-12-01 ENCOUNTER — Telehealth: Payer: Self-pay | Admitting: Adult Health

## 2023-12-01 NOTE — Telephone Encounter (Signed)
 Patient called to reschedule appt for a later date. Appt rescheduled for 01/24 @840 

## 2023-12-05 ENCOUNTER — Inpatient Hospital Stay: Payer: Self-pay | Admitting: Adult Health

## 2023-12-05 ENCOUNTER — Ambulatory Visit: Payer: Self-pay

## 2023-12-09 ENCOUNTER — Other Ambulatory Visit: Payer: Self-pay | Admitting: Internal Medicine

## 2023-12-19 ENCOUNTER — Inpatient Hospital Stay: Payer: Self-pay

## 2023-12-19 ENCOUNTER — Encounter: Payer: Self-pay | Admitting: Hematology and Oncology

## 2023-12-19 ENCOUNTER — Inpatient Hospital Stay: Payer: Self-pay | Attending: Adult Health | Admitting: Hematology and Oncology

## 2023-12-19 VITALS — BP 148/87 | HR 87 | Temp 98.2°F | Resp 18 | Ht 62.0 in | Wt 140.2 lb

## 2023-12-19 DIAGNOSIS — Z800 Family history of malignant neoplasm of digestive organs: Secondary | ICD-10-CM | POA: Insufficient documentation

## 2023-12-19 DIAGNOSIS — Z8711 Personal history of peptic ulcer disease: Secondary | ICD-10-CM | POA: Insufficient documentation

## 2023-12-19 DIAGNOSIS — D62 Acute posthemorrhagic anemia: Secondary | ICD-10-CM

## 2023-12-19 DIAGNOSIS — D509 Iron deficiency anemia, unspecified: Secondary | ICD-10-CM

## 2023-12-19 DIAGNOSIS — N926 Irregular menstruation, unspecified: Secondary | ICD-10-CM | POA: Insufficient documentation

## 2023-12-19 DIAGNOSIS — I10 Essential (primary) hypertension: Secondary | ICD-10-CM | POA: Insufficient documentation

## 2023-12-19 DIAGNOSIS — Z8051 Family history of malignant neoplasm of kidney: Secondary | ICD-10-CM | POA: Insufficient documentation

## 2023-12-19 LAB — CBC WITH DIFFERENTIAL/PLATELET
Abs Immature Granulocytes: 0.01 10*3/uL (ref 0.00–0.07)
Basophils Absolute: 0.1 10*3/uL (ref 0.0–0.1)
Basophils Relative: 2 %
Eosinophils Absolute: 0.3 10*3/uL (ref 0.0–0.5)
Eosinophils Relative: 6 %
HCT: 27.5 % — ABNORMAL LOW (ref 36.0–46.0)
Hemoglobin: 8.3 g/dL — ABNORMAL LOW (ref 12.0–15.0)
Immature Granulocytes: 0 %
Lymphocytes Relative: 42 %
Lymphs Abs: 2 10*3/uL (ref 0.7–4.0)
MCH: 21.8 pg — ABNORMAL LOW (ref 26.0–34.0)
MCHC: 30.2 g/dL (ref 30.0–36.0)
MCV: 72.2 fL — ABNORMAL LOW (ref 80.0–100.0)
Monocytes Absolute: 0.5 10*3/uL (ref 0.1–1.0)
Monocytes Relative: 10 %
Neutro Abs: 1.8 10*3/uL (ref 1.7–7.7)
Neutrophils Relative %: 40 %
Platelets: 474 10*3/uL — ABNORMAL HIGH (ref 150–400)
RBC: 3.81 MIL/uL — ABNORMAL LOW (ref 3.87–5.11)
RDW: 17.3 % — ABNORMAL HIGH (ref 11.5–15.5)
WBC: 4.6 10*3/uL (ref 4.0–10.5)
nRBC: 0 % (ref 0.0–0.2)

## 2023-12-19 LAB — FERRITIN: Ferritin: 4 ng/mL — ABNORMAL LOW (ref 11–307)

## 2023-12-19 LAB — IRON AND IRON BINDING CAPACITY (CC-WL,HP ONLY)
Iron: 13 ug/dL — ABNORMAL LOW (ref 28–170)
Saturation Ratios: 3 % — ABNORMAL LOW (ref 10.4–31.8)
TIBC: 522 ug/dL — ABNORMAL HIGH (ref 250–450)
UIBC: 509 ug/dL — ABNORMAL HIGH (ref 148–442)

## 2023-12-19 NOTE — Progress Notes (Signed)
[facility] CONSULT NOTE  Patient Care Team: Dorothyann Peng, MD as PCP - General (Internal Medicine)  CHIEF COMPLAINTS/PURPOSE OF CONSULTATION:  IDA  ASSESSMENT & PLAN:   This is a very pleasant 53 yr old female patient with past medical history significant for gastric ulcers back in 2018 when she had acute blood loss anemia here for follow up.  Iron Deficiency Anemia Hemoglobin today 8.2, iron panel consistent with iron deficiency, will recommend iron supplementation IV given history of gastric by pass. Pica (baking soda), not currently on oral iron due to constipation.   Menstrual Irregularity Increased frequency of menstrual cycles, possibly perimenopausal. -Continue to monitor.  Hypertension -Continue current management.  General Health Maintenance -Continue healthy diet with increased protein and vegetables. -Ensure up-to-date with mammogram. -Follow-up in 6 months, or sooner if IV iron is needed.  HISTORY OF PRESENTING ILLNESS:  Susan [name] 53 y.o. female is here because of IDA  This is a very pleasant 53 yr old female patient with PMH significant for gastric ulcer disease in 2018 referred to hematology for IDA. 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    Interval History  The patient, with a history of pica and iron deficiency, reports a preference for consuming baking soda. She believes her iron levels may be declining, but is not currently taking any iron supplements due to previous intolerance of oral iron due to constipation. She did, however, tolerate IV iron well during a previous hospital admission.  In addition, the patient reports that her menstrual cycles have become more frequent, occurring twice a month. She is 53 years old and has been menstruating since the age of 22, and she expresses a hope that she is entering perimenopause.  The patient also mentions a history of high blood pressure, but does not elaborate on this issue.  Rest of the  pertinent 10 point ROS reviewed and neg.  MEDICAL HISTORY:  Past Medical History:  Diagnosis Date   Anemia 2016   with bleeding ulcer   Arthritis    oa hands   Constipation    Dizziness    GERD (gastroesophageal reflux disease)    no current meds   GI bleeding    Glaucoma    Headache    headaches and migraines   History of bleeding ulcers    History of stomach ulcers    Hypertension    Hypothyroidism    Insomnia    Iron deficiency    Irregular periods    Osteoarthritis of both hands    PCOS (polycystic ovarian syndrome)    Pre-diabetes    Syncope    Vitamin D deficiency     SURGICAL HISTORY: Past Surgical History:  Procedure Laterality Date   BILATERAL SALPINGECTOMY Bilateral 03/22/2013   Procedure: BILATERAL SALPINGECTOMY;  Surgeon: Serita Kyle, MD;  Location: [facility];  Service: Gynecology;  Laterality: Bilateral;   CARPAL TUNNEL RELEASE Right 11/15/2013   Procedure: RIGHT CARPAL TUNNEL RELEASE;  Surgeon: Nicki Reaper, MD;  Location: [facility];  Service: Orthopedics;  Laterality: Right;   COSMETIC SURGERY  01/2015   tummy tuck    ESOPHAGOGASTRODUODENOSCOPY N/A 10/04/2015   Procedure: ESOPHAGOGASTRODUODENOSCOPY (EGD);  Surgeon: Ruffin Frederick, MD;  Location: [facility];  Service: Gastroenterology;  Laterality: N/A;   GANGLION CYST EXCISION Right 11/15/2013   Procedure: EXCISION CYST RIGHT WRIST;  Surgeon: Nicki Reaper, MD;  Location: [facility];  Service: Orthopedics;  Laterality: Right;   LAPAROSCOPIC GASTRIC BAND  REMOVAL WITH LAPAROSCOPIC GASTRIC SLEEVE RESECTION N/A 09/23/2017   Procedure: LAPAROSCOPIC GASTRIC BAND REMOVAL WITH LAPAROSCOPIC GASTRIC SLEEVE RESECTION;  Surgeon: Luretha Murphy, MD;  Location: [facility];  Service: General;  Laterality: N/A;   LAPAROSCOPIC GASTRIC BANDING  2010   LAPAROSCOPY N/A 03/22/2013   Procedure: LAPAROSCOPY OPERATIVE;  Surgeon: Serita Kyle, MD;  Location: [facility];  Service:  Gynecology;  Laterality: N/A;   MOUTH SURGERY  1991   SALPINGECTOMY  03/22/2013    SOCIAL HISTORY: Social History   Socioeconomic History   Marital status: Married    Spouse name: Librarian, academic   Number of children: Not on file   Years of education: Not on file   Highest education level: Professional school degree (e.g., MD, DDS, DVM, JD)  Occupational History   Occupation: Pediatric Dentist  Tobacco Use   Smoking status: Never   Smokeless tobacco: Never  Vaping Use   Vaping status: Never Used  Substance and Sexual Activity   Alcohol use: Yes    Alcohol/week: 1.0 standard drink of alcohol    Types: 1 Standard drinks or equivalent per week    Comment: socially   Drug use: No   Sexual activity: Not on file  Other Topics Concern   Not on file  Social History Narrative   Not on file   Social Drivers of Health   Financial Resource Strain: Low Risk  (08/04/2023)   Overall Financial Resource Strain (CARDIA)    Difficulty of Paying Living Expenses: Not very hard  Food Insecurity: No Food Insecurity (08/04/2023)   Hunger Vital Sign    Worried About Running Out of Food in the Last Year: Never true    Ran Out of Food in the Last Year: Never true  Transportation Needs: No Transportation Needs (08/04/2023)   PRAPARE - Administrator, Civil Service (Medical): No    Lack of Transportation (Non-Medical): No  Physical Activity: Unknown (08/04/2023)   Exercise Vital Sign    Days of Exercise per Week: 0 days    Minutes of Exercise per Session: Not on file  Stress: Stress Concern Present (08/04/2023)   [employer] of Occupational Health - Occupational Stress Questionnaire    Feeling of Stress : To some extent  Social Connections: Socially Integrated (08/04/2023)   Social Connection and Isolation Panel [NHANES]    Frequency of Communication with Friends and Family: More than three times a week    Frequency of Social Gatherings with Friends and Family: Twice a week    Attends  Religious Services: 1 to 4 times per year    Active Member of [employer] or Organizations: Yes    Attends Engineer, structural: More than 4 times per year    Marital Status: Married  Catering manager Violence: Not At Risk (04/03/2023)   Humiliation, Afraid, Rape, and Kick questionnaire    Fear of Current or Ex-Partner: No    Emotionally Abused: No    Physically Abused: No    Sexually Abused: No    FAMILY HISTORY: Family History  Problem Relation Age of Onset   Hypertension Mother    Cancer Mother        pancreatic   Stroke Mother    Diabetes Mother    Diabetes gravidarum Mother    High blood pressure Mother    Heart disease Mother    Obesity Mother    Hypertension Father    Ulcers Father    Diabetes Father    Thyroid disease Father  Hypertension Brother    CAD Maternal Grandmother    Kidney cancer Maternal Grandmother    Diabetes Paternal Grandmother    Heart attack Paternal Grandfather     ALLERGIES:  is allergic to crestor [rosuvastatin].  MEDICATIONS:  Current Outpatient Medications  Medication Sig Dispense Refill   amLODipine (NORVASC) 10 MG tablet Take 10 mg by mouth daily.     Eszopiclone 3 MG TABS TAKE 1 TABLET(3 MG) BY MOUTH AT BEDTIME AS NEEDED 30 tablet 1   Evolocumab (REPATHA SURECLICK) 140 MG/ML SOAJ INJECT 140MG  UNDER THE SKIN EVERY 14 DAYS 2 mL 6   Iron-FA-B Cmp-C-Biot-Probiotic (FUSION PLUS) CAPS One capsule po qd (Patient not taking: Reported on 08/06/2023) 30 capsule 4   omeprazole (PRILOSEC) 20 MG capsule Take 20 mg by mouth daily.     valsartan-hydrochlorothiazide (DIOVAN-HCT) 320-12.5 MG tablet Take 1 tablet by mouth daily. 90 tablet 3   No current facility-administered medications for this visit.     PHYSICAL EXAMINATION: ECOG PERFORMANCE STATUS: 0 - Asymptomatic  Vitals:   12/19/23 0842  BP: (!) 148/87  Pulse: 87  Resp: 18  Temp: 98.2 F (36.8 C)  SpO2: 100%   Filed Weights   12/19/23 0842  Weight: 140 lb 3.2 oz (63.6 kg)     GENERAL:alert, no distress and comfortable SKIN: skin color, texture, turgor are normal,  NECK: supple, LYMPH:  no palpable lymphadenopathy in the cervical, axillary  LUNGS: clear to auscultation and percussion with normal breathing effort HEART: regular rate & rhythm and no murmurs and no lower extremity edema ABDOMEN:abdomen soft, non-tender and normal bowel sounds Musculoskeletal:no cyanosis of digits PSYCH: alert & oriented x 3 with fluent speech NEURO: no focal motor/sensory deficits  LABORATORY DATA:  I have reviewed the data as listed Lab Results  Component Value Date   WBC 4.7 08/01/2023   HGB 10.4 (L) 08/01/2023   HCT 33.1 (L) 08/01/2023   MCV 85.1 08/01/2023   PLT 441 (H) 08/01/2023     Chemistry      Component Value Date/Time   NA 142 10/31/2023 0917   K 4.0 10/31/2023 0917   CL 98 10/31/2023 0917   CO2 35 (H) 10/31/2023 0917   BUN 10 10/31/2023 0917   CREATININE 0.79 10/31/2023 0917      Component Value Date/Time   CALCIUM 8.9 10/31/2023 0917   ALKPHOS 54 04/05/2023 0655   AST 23 04/05/2023 0655   ALT 18 04/05/2023 0655   BILITOT 0.8 04/05/2023 0655   BILITOT 0.4 03/19/2023 1647       RADIOGRAPHIC STUDIES: I have personally reviewed the radiological images as listed and agreed with the findings in the report. No results found.  All questions were answered. The patient knows to call the clinic with any problems, questions or concerns. I spent 30 minutes in the care of this patient including H and P, review of records, counseling and coordination of care.     Rachel Moulds, MD 12/19/2023 9:04 AM

## 2023-12-20 ENCOUNTER — Other Ambulatory Visit: Payer: Self-pay | Admitting: Hematology and Oncology

## 2024-01-09 ENCOUNTER — Ambulatory Visit: Payer: Self-pay | Attending: Internal Medicine

## 2024-01-09 NOTE — Progress Notes (Deleted)
   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 female patient of Dr Duke Salvia, in the office today for hypertension management.  She was seen in August 2024 by Gillian Shields, and found to have controlled BP on amlodipine 10 mg daily.   She called in after that to report the amlodipine was causing her ankles to swell.  It was stopped and she was given valsartan hctz 160/12.5.  Patient reported that swelling decreased once she switched medications, but she has not been checking BP at home much.  When I saw her in December, pressure was 158/98, and valsartan hctz was increased to 320/12.5 mg.    Today she is in the office for follow up.    Blood Pressure Goal:  130/80  Current Medications:  valsartan hctz 160/12.5  Previously tried:  amlodipine - 10 mg caused LEE  Family Hx: father had elevated cholesterol though no ASCVD, hypertension; pgf died from MI; mother with stroke, died at 66; one brother, reently hospitalized for hyeprtensive emergency; 2 children - healthy  Social Hx:      Tobacco: no  Alcohol: durrently daily  Caffeine: no regular caffeine  Diet:  mix of home and eating out, no fast food, more sit down restaurnats; variety of protien - keeps high protein ; enjoys littles salty snack in evening  Exercise: none  Home BP readings:  none with her today    Accessory Clinical Findings    Lab Results  Component Value Date   CREATININE 0.79 10/31/2023   BUN 10 10/31/2023   NA 142 10/31/2023   K 4.0 10/31/2023   CL 98 10/31/2023   CO2 35 (H) 10/31/2023   Lab  Results  Component Value Date   ALT 18 04/05/2023   AST 23 04/05/2023   ALKPHOS 54 04/05/2023   BILITOT 0.8 04/05/2023   Lab Results  Component Value Date   HGBA1C 5.9 (H) 04/02/2023    Home Medications    Current Outpatient Medications  Medication Sig Dispense Refill   amLODipine (NORVASC) 10 MG tablet Take 10 mg by mouth daily.     Eszopiclone 3 MG TABS TAKE 1 TABLET(3 MG) BY MOUTH AT BEDTIME AS NEEDED 30 tablet 1   Evolocumab (REPATHA SURECLICK) 140 MG/ML SOAJ INJECT 140MG  UNDER THE SKIN EVERY 14 DAYS 2 mL 6   Iron-FA-B Cmp-C-Biot-Probiotic (FUSION PLUS) CAPS One capsule po qd (Patient not taking: Reported on 08/06/2023) 30 capsule 4   omeprazole (PRILOSEC) 20 MG capsule Take 20 mg by mouth daily.     valsartan-hydrochlorothiazide (DIOVAN-HCT) 320-12.5 MG tablet Take 1 tablet by mouth daily. 90 tablet 3   No current facility-administered medications for this visit.     No BP recorded.  {Refresh Note OR Click here to enter BP  :1}***   Assessment & Plan    No problem-specific Assessment & Plan notes found for this encounter.   Phillips Hay PharmD CPP [facility]  [address] [place], [place] [address] [phone]

## 2024-01-20 ENCOUNTER — Encounter: Payer: Self-pay | Admitting: Internal Medicine

## 2024-01-20 ENCOUNTER — Telehealth: Payer: Self-pay | Admitting: Hematology and Oncology

## 2024-01-20 ENCOUNTER — Ambulatory Visit (INDEPENDENT_AMBULATORY_CARE_PROVIDER_SITE_OTHER): Payer: 59 | Admitting: Internal Medicine

## 2024-01-20 ENCOUNTER — Other Ambulatory Visit: Payer: Self-pay | Admitting: *Deleted

## 2024-01-20 ENCOUNTER — Other Ambulatory Visit: Payer: Self-pay | Admitting: Hematology and Oncology

## 2024-01-20 ENCOUNTER — Encounter: Payer: Self-pay | Admitting: Hematology and Oncology

## 2024-01-20 ENCOUNTER — Other Ambulatory Visit: Payer: Self-pay

## 2024-01-20 VITALS — BP 122/78 | HR 113 | Temp 98.0°F | Ht 62.0 in | Wt 142.2 lb

## 2024-01-20 DIAGNOSIS — F419 Anxiety disorder, unspecified: Secondary | ICD-10-CM

## 2024-01-20 DIAGNOSIS — R000 Tachycardia, unspecified: Secondary | ICD-10-CM

## 2024-01-20 DIAGNOSIS — F5101 Primary insomnia: Secondary | ICD-10-CM

## 2024-01-20 DIAGNOSIS — E7800 Pure hypercholesterolemia, unspecified: Secondary | ICD-10-CM

## 2024-01-20 DIAGNOSIS — Z0000 Encounter for general adult medical examination without abnormal findings: Secondary | ICD-10-CM | POA: Diagnosis not present

## 2024-01-20 DIAGNOSIS — D62 Acute posthemorrhagic anemia: Secondary | ICD-10-CM

## 2024-01-20 DIAGNOSIS — D508 Other iron deficiency anemias: Secondary | ICD-10-CM

## 2024-01-20 DIAGNOSIS — I10 Essential (primary) hypertension: Secondary | ICD-10-CM | POA: Diagnosis not present

## 2024-01-20 MED ORDER — VALSARTAN-HYDROCHLOROTHIAZIDE 160-12.5 MG PO TABS
1.0000 | ORAL_TABLET | Freq: Every day | ORAL | 2 refills | Status: DC
Start: 2024-01-20 — End: 2024-07-28

## 2024-01-20 MED ORDER — ESZOPICLONE 3 MG PO TABS: ORAL_TABLET | ORAL | 1 refills | Status: DC

## 2024-01-20 NOTE — Telephone Encounter (Signed)
 Left patient a vm regarding upcoming appointment

## 2024-01-20 NOTE — Progress Notes (Signed)
 Received a message from Dr Allyne Gee about pt's IV iron. Will communicate with our schedulers and have this scheduled asap.  Anjelina Dung

## 2024-01-20 NOTE — Assessment & Plan Note (Signed)

## 2024-01-20 NOTE — Patient Instructions (Signed)

## 2024-01-20 NOTE — Progress Notes (Signed)
 I,[name], CMA,acting as a Neurosurgeon for [name], MD.,have documented all relevant documentation on the behalf of [name], MD,as directed by  [name], MD while in the presence of [name], MD.  Subjective:    Patient ID: Susan [name] , female    DOB: 16-May-1971 , 53 y.o.   MRN: [id number]  Chief Complaint  Patient presents with  . Annual Exam  . Hypertension    HPI  Patient presents today for annual exam. GYN: Dr Cherly Hensen. She reports compliance with medications. Denies headache, chest pain & sob. She has no specific concerns or complaints at this time.   Letter sent to gyn for pap result.   Hypertension This is a chronic problem. The current episode started more than 1 year ago. The problem has been gradually improving since onset. The problem is controlled. Pertinent negatives include no blurred vision, chest pain or palpitations. Risk factors for coronary artery disease include obesity and stress. Past treatments include angiotensin blockers and diuretics. The current treatment provides moderate improvement.     Past Medical History:  Diagnosis Date  . Anemia 2016   with bleeding ulcer  . Arthritis    oa hands  . Constipation   . Dizziness   . GERD (gastroesophageal reflux disease)    no current meds  . GI bleeding   . Glaucoma   . Headache    headaches and migraines  . History of bleeding ulcers   . History of stomach ulcers   . Hypertension   . Hypothyroidism   . Insomnia   . Iron deficiency   . Irregular periods   . Osteoarthritis of both hands   . PCOS (polycystic ovarian syndrome)   . Pre-diabetes   . Syncope   . Vitamin D deficiency      Family History  Problem Relation Age of Onset  . Hypertension Mother   . Cancer Mother        pancreatic  . Stroke Mother   . Diabetes Mother   . Diabetes gravidarum Mother   . High blood pressure Mother   . Heart disease Mother   . Obesity Mother   . Hypertension Father    . Ulcers Father   . Diabetes Father   . Thyroid disease Father   . Hypertension Brother   . CAD Maternal Grandmother   . Kidney cancer Maternal Grandmother   . Diabetes Paternal Grandmother   . Heart attack Paternal Grandfather      Current Outpatient Medications:  .  Evolocumab (REPATHA SURECLICK) 140 MG/ML SOAJ, INJECT 140MG  UNDER THE SKIN EVERY 14 DAYS, Disp: 2 mL, Rfl: 6 .  omeprazole (PRILOSEC) 20 MG capsule, Take 20 mg by mouth daily., Disp: , Rfl:  .  valsartan-hydrochlorothiazide (DIOVAN HCT) 160-12.5 MG tablet, Take 1 tablet by mouth daily., Disp: 90 tablet, Rfl: 2 .  Eszopiclone 3 MG TABS, Take immediately before bedtime, Disp: 30 tablet, Rfl: 1 .  Iron-FA-B Cmp-C-Biot-Probiotic (FUSION PLUS) CAPS, One capsule po qd (Patient not taking: Reported on 01/20/2024), Disp: 30 capsule, Rfl: 4   Allergies  Allergen Reactions  . Crestor [Rosuvastatin]     Myalgias       The patient states she uses IUD for birth control. No LMP recorded. (Menstrual status: IUD).. Negative for Dysmenorrhea. Negative for: breast discharge, breast lump(s), breast pain and breast self exam. Associated symptoms include abnormal vaginal bleeding. Pertinent negatives include abnormal bleeding (hematology), anxiety, decreased libido, depression, difficulty falling sleep,  dyspareunia, history of infertility, nocturia, sexual dysfunction, sleep disturbances, urinary incontinence, urinary urgency, vaginal discharge and vaginal itching. Diet regular.The patient states her exercise level is  intermittent.  . The patient's tobacco use is:  Social History   Tobacco Use  Smoking Status Never  Smokeless Tobacco Never  . She has been exposed to passive smoke. The patient's alcohol use is:  Social History   Substance and Sexual Activity  Alcohol Use Yes  . Alcohol/week: 1.0 standard drink of alcohol  . Types: 1 Standard drinks or equivalent per week   Comment: socially   Review of Systems  Constitutional:  Negative.   HENT: Negative.    Eyes: Negative.  Negative for blurred vision.  Respiratory: Negative.    Cardiovascular: Negative.  Negative for chest pain and palpitations.  Gastrointestinal: Negative.   Endocrine: Negative.   Genitourinary: Negative.   Musculoskeletal: Negative.   Skin: Negative.   Allergic/Immunologic: Negative.   Neurological: Negative.   Hematological: Negative.   Psychiatric/Behavioral: Negative.       Today's Vitals   01/20/24 1432  BP: 122/78  Pulse: (!) 113  Temp: 98 F (36.7 C)  SpO2: 98%  Weight: 142 lb 3.2 oz (64.5 kg)  Height: 5\' 2"  (1.575 m)   Body mass index is 26.01 kg/m.  Wt Readings from Last 3 Encounters:  01/20/24 142 lb 3.2 oz (64.5 kg)  12/19/23 140 lb 3.2 oz (63.6 kg)  08/06/23 141 lb 9.6 oz (64.2 kg)     Objective:  Physical Exam Vitals and nursing note reviewed.  Constitutional:      Appearance: Normal appearance.  HENT:     Head: Normocephalic and atraumatic.     Right Ear: Tympanic membrane, ear canal and external ear normal.     Left Ear: Tympanic membrane, ear canal and external ear normal.     Nose: Nose normal.     Mouth/Throat:     Mouth: Mucous membranes are moist.     Pharynx: Oropharynx is clear.  Eyes:     Extraocular Movements: Extraocular movements intact.     Conjunctiva/sclera: Conjunctivae normal.     Pupils: Pupils are equal, round, and reactive to light.  Cardiovascular:     Rate and Rhythm: Normal rate and regular rhythm.     Pulses: Normal pulses.     Heart sounds: Normal heart sounds.  Pulmonary:     Effort: Pulmonary effort is normal.     Breath sounds: Normal breath sounds.  Chest:  Breasts:    Right: No inverted nipple, mass or nipple discharge.     Left: No inverted nipple, mass or nipple discharge.     Comments: Healed scars Abdominal:     General: Abdomen is flat. Bowel sounds are normal.     Palpations: Abdomen is soft.  Genitourinary:    Comments: deferred Musculoskeletal:         General: Normal range of motion.     Cervical back: Normal range of motion and neck supple.  Skin:    General: Skin is warm and dry.     Comments: Tattoo on abdomen Patchy alopecia  Neurological:     General: No focal deficit present.     Mental Status: She is alert and oriented to person, place, and time.  Psychiatric:        Mood and Affect: Mood normal.        Behavior: Behavior normal.        Assessment And Plan:     Encounter  for annual health examination Assessment & Plan: A full exam was performed.  Importance of monthly self breast exams was discussed with the patient.  She is advised to get 30-45 minutes of regular exercise, no less than four to five days per week. Both weight-bearing and aerobic exercises are recommended.  She is advised to follow a healthy diet with at least six fruits/veggies per day, decrease intake of red meat and other saturated fats and to increase fish intake to twice weekly.  Meats/fish should not be fried -- baked, boiled or broiled is preferable. It is also important to cut back on your sugar intake.  Be sure to read labels - try to avoid anything with added sugar, high fructose corn syrup or other sweeteners.  If you must use a sweetener, you can try stevia or monkfruit.  It is also important to avoid artificially sweetened foods/beverages and diet drinks. Lastly, wear SPF 50 sunscreen on exposed skin and when in direct sunlight for an extended period of time.  Be sure to avoid fast food restaurants and aim for at least 60 ounces of water daily.      Orders: -     CMP14+EGFR -     Hemoglobin A1c -     TSH  Essential hypertension Assessment & Plan: Chronic, controlled.  She will continue with valsartan/hct 160/12.5mg  daily.  She is encouraged to follow low sodium diet.  EKG performed, Sinus Tachycardia  w/ occasional ectopic ventricular beat, low voltage in precordial leads, and anterior infarct.  She will f/u in six months for re-evaluation.    Orders: -     POCT urinalysis dipstick -     EKG 12-Lead -     Microalbumin / creatinine urine ratio  Pure hypercholesterolemia Assessment & Plan: Chronic, she has been prescribed Repatha by Cardiology. She admits she has not been taking it recently.  Importance of medication/dietary compliance was discussed with the patient.    Other iron deficiency anemia Assessment & Plan: Chronic, she is unable to tolerate oral iron.  Most recent labs reviewed, most recent Hematology note reviewed. I did reach out to Dr. Al Pimple, who will fast track her next iron infusion. Achieving optimal iron levels will also help to address hair loss.    Tachycardia Assessment & Plan: HR 110.  May need to consider changing hydrochlorothiazide to amlodipine if persistent. Sx likely exacerbated by iron deficiency anemia. She is also encouraged to stay well hydrated.    Other orders -     Eszopiclone; Take immediately before bedtime  Dispense: 30 tablet; Refill: 1 -     Valsartan-hydroCHLOROthiazide; Take 1 tablet by mouth daily.  Dispense: 90 tablet; Refill: 2     Return for 1 year hm, 6 MONTH CHOL F/U.[name]  Patient was given opportunity to ask questions. Patient verbalized understanding of the plan and was able to repeat key elements of the plan. All questions were answered to their satisfaction.   I, [name], MD, have reviewed all documentation for this visit. The documentation on 01/20/24 for the exam, diagnosis, procedures, and orders are all accurate and complete.

## 2024-01-21 ENCOUNTER — Encounter: Payer: Self-pay | Admitting: Internal Medicine

## 2024-01-21 DIAGNOSIS — R000 Tachycardia, unspecified: Secondary | ICD-10-CM | POA: Insufficient documentation

## 2024-01-21 LAB — HEMOGLOBIN A1C
Est. average glucose Bld gHb Est-mCnc: 111 mg/dL
Hgb A1c MFr Bld: 5.5 % (ref 4.8–5.6)

## 2024-01-21 LAB — TSH: TSH: 0.88 u[IU]/mL (ref 0.450–4.500)

## 2024-01-21 LAB — CMP14+EGFR
ALT: 23 [IU]/L (ref 0–32)
AST: 26 [IU]/L (ref 0–40)
Albumin: 4.4 g/dL (ref 3.8–4.9)
Alkaline Phosphatase: 73 [IU]/L (ref 44–121)
BUN/Creatinine Ratio: 11 (ref 9–23)
BUN: 9 mg/dL (ref 6–24)
Bilirubin Total: 0.4 mg/dL (ref 0.0–1.2)
CO2: 24 mmol/L (ref 20–29)
Calcium: 9.7 mg/dL (ref 8.7–10.2)
Chloride: 104 mmol/L (ref 96–106)
Creatinine, Ser: 0.8 mg/dL (ref 0.57–1.00)
Globulin, Total: 3.1 g/dL (ref 1.5–4.5)
Glucose: 83 mg/dL (ref 70–99)
Potassium: 3.9 mmol/L (ref 3.5–5.2)
Sodium: 141 mmol/L (ref 134–144)
Total Protein: 7.5 g/dL (ref 6.0–8.5)
eGFR: 89 mL/min/{1.73_m2} (ref >59)

## 2024-01-21 LAB — MICROALBUMIN / CREATININE URINE RATIO
Creatinine, Urine: 187.8 mg/dL
Microalb/Creat Ratio: 7 mg/g{creat} (ref 0–29)
Microalbumin, Urine: 13.5 ug/mL

## 2024-01-21 NOTE — Assessment & Plan Note (Signed)
 Chronic, she has been prescribed Repatha by Cardiology. She admits she has not been taking it recently.  Importance of medication/dietary compliance was discussed with the patient.

## 2024-01-21 NOTE — Assessment & Plan Note (Addendum)
 Chronic, she is unable to tolerate oral iron.  Most recent labs reviewed, most recent Hematology note reviewed. I did reach out to Dr. Al Pimple, who will fast track her next iron infusion. Achieving optimal iron levels will also help to address hair loss.

## 2024-01-21 NOTE — Assessment & Plan Note (Signed)
 Chronic, controlled.  She will continue with valsartan/hct 160/12.5mg  daily.  She is encouraged to follow low sodium diet.  EKG performed, Sinus Tachycardia  w/ occasional ectopic ventricular beat, low voltage in precordial leads, and anterior infarct.  She will f/u in six months for re-evaluation.

## 2024-01-21 NOTE — Assessment & Plan Note (Signed)
 HR 110.  May need to consider changing hydrochlorothiazide to amlodipine if persistent. Sx likely exacerbated by iron deficiency anemia. She is also encouraged to stay well hydrated.

## 2024-01-22 LAB — POCT URINALYSIS DIPSTICK
Bilirubin, UA: NEGATIVE
Blood, UA: NEGATIVE
Glucose, UA: NEGATIVE
Ketones, UA: NEGATIVE
Leukocytes, UA: NEGATIVE
Nitrite, UA: NEGATIVE
Protein, UA: NEGATIVE
Spec Grav, UA: 1.025 (ref 1.010–1.025)
Urobilinogen, UA: 0.2 U/dL
pH, UA: 5.5 (ref 5.0–8.0)

## 2024-01-24 LAB — SPECIMEN STATUS REPORT

## 2024-01-24 LAB — T4, FREE: Free T4: 1.23 ng/dL (ref 0.82–1.77)

## 2024-02-05 ENCOUNTER — Inpatient Hospital Stay: Payer: Self-pay

## 2024-02-12 ENCOUNTER — Inpatient Hospital Stay: Payer: Self-pay

## 2024-02-13 ENCOUNTER — Inpatient Hospital Stay: Payer: Self-pay | Attending: Adult Health

## 2024-02-13 ENCOUNTER — Telehealth: Payer: Self-pay | Admitting: Hematology and Oncology

## 2024-02-13 VITALS — BP 154/99 | HR 86 | Temp 98.3°F | Resp 14

## 2024-02-13 DIAGNOSIS — D509 Iron deficiency anemia, unspecified: Secondary | ICD-10-CM | POA: Diagnosis present

## 2024-02-13 DIAGNOSIS — D62 Acute posthemorrhagic anemia: Secondary | ICD-10-CM

## 2024-02-13 MED ORDER — SODIUM CHLORIDE 0.9 % IV SOLN
300.0000 mg | Freq: Once | INTRAVENOUS | Status: AC
  Administered 2024-02-13: 300 mg via INTRAVENOUS
  Filled 2024-02-13: qty 300

## 2024-02-13 MED ORDER — SODIUM CHLORIDE 0.9 % IV SOLN: INTRAVENOUS | Status: DC

## 2024-02-13 NOTE — Telephone Encounter (Signed)
 Rescheduled appointment per patients request. Patient came by scheduling to get appointments changed. Patient is aware of the changes made to her upcoming appointment.

## 2024-02-13 NOTE — Patient Instructions (Signed)

## 2024-02-13 NOTE — Progress Notes (Signed)
 Pt tolerated IV iron infusion well. Monitored for 30 minute post observation period without incident. Pts BP elevated prior to infusion and after infusion. Takes BP medication as prescribed. No side effects noted. Ambulatory to the lobby.

## 2024-02-19 ENCOUNTER — Inpatient Hospital Stay: Payer: Self-pay

## 2024-02-21 ENCOUNTER — Inpatient Hospital Stay: Payer: Self-pay

## 2024-02-21 VITALS — BP 159/101 | HR 67 | Temp 98.3°F | Resp 17

## 2024-02-21 DIAGNOSIS — D62 Acute posthemorrhagic anemia: Secondary | ICD-10-CM

## 2024-02-21 DIAGNOSIS — D509 Iron deficiency anemia, unspecified: Secondary | ICD-10-CM | POA: Diagnosis not present

## 2024-02-21 MED ORDER — SODIUM CHLORIDE 0.9 % IV SOLN: INTRAVENOUS | Status: DC

## 2024-02-21 MED ORDER — SODIUM CHLORIDE 0.9 % IV SOLN
300.0000 mg | Freq: Once | INTRAVENOUS | Status: AC
  Administered 2024-02-21: 300 mg via INTRAVENOUS
  Filled 2024-02-21: qty 300

## 2024-02-27 ENCOUNTER — Other Ambulatory Visit (HOSPITAL_BASED_OUTPATIENT_CLINIC_OR_DEPARTMENT_OTHER): Payer: Self-pay | Admitting: Cardiovascular Disease

## 2024-02-28 ENCOUNTER — Inpatient Hospital Stay: Payer: Self-pay

## 2024-03-06 ENCOUNTER — Inpatient Hospital Stay: Payer: Self-pay | Attending: Adult Health

## 2024-03-06 VITALS — BP 144/87 | HR 59 | Temp 97.8°F | Resp 17

## 2024-03-06 DIAGNOSIS — D509 Iron deficiency anemia, unspecified: Secondary | ICD-10-CM | POA: Insufficient documentation

## 2024-03-06 DIAGNOSIS — D62 Acute posthemorrhagic anemia: Secondary | ICD-10-CM

## 2024-03-06 MED ORDER — SODIUM CHLORIDE 0.9 % IV SOLN
300.0000 mg | Freq: Once | INTRAVENOUS | Status: AC
  Administered 2024-03-06: 300 mg via INTRAVENOUS
  Filled 2024-03-06: qty 300

## 2024-03-06 MED ORDER — SODIUM CHLORIDE 0.9 % IV SOLN: INTRAVENOUS | Status: DC

## 2024-04-12 ENCOUNTER — Other Ambulatory Visit: Payer: Self-pay | Admitting: Interventional Radiology

## 2024-04-12 ENCOUNTER — Other Ambulatory Visit: Payer: Self-pay | Admitting: Obstetrics and Gynecology

## 2024-04-12 DIAGNOSIS — D250 Submucous leiomyoma of uterus: Secondary | ICD-10-CM

## 2024-04-12 DIAGNOSIS — N920 Excessive and frequent menstruation with regular cycle: Secondary | ICD-10-CM

## 2024-04-14 ENCOUNTER — Encounter: Payer: Self-pay | Admitting: Interventional Radiology

## 2024-04-16 ENCOUNTER — Encounter: Payer: Self-pay | Admitting: Interventional Radiology

## 2024-04-20 ENCOUNTER — Encounter: Payer: Self-pay | Admitting: Interventional Radiology

## 2024-04-27 ENCOUNTER — Other Ambulatory Visit: Payer: Self-pay | Admitting: Internal Medicine

## 2024-04-30 ENCOUNTER — Encounter (HOSPITAL_COMMUNITY): Payer: Self-pay | Admitting: *Deleted

## 2024-05-03 ENCOUNTER — Other Ambulatory Visit

## 2024-05-04 ENCOUNTER — Ambulatory Visit
Admission: RE | Admit: 2024-05-04 | Discharge: 2024-05-04 | Disposition: A | Discharge status: Home / Self Care | Source: Ambulatory Visit | Attending: Interventional Radiology | Admitting: Interventional Radiology

## 2024-05-04 DIAGNOSIS — D250 Submucous leiomyoma of uterus: Secondary | ICD-10-CM

## 2024-05-04 DIAGNOSIS — N920 Excessive and frequent menstruation with regular cycle: Secondary | ICD-10-CM

## 2024-05-04 MED ORDER — GADOPICLENOL 0.5 MMOL/ML IV SOLN
6.0000 mL | Freq: Once | INTRAVENOUS | Status: AC | PRN
  Administered 2024-05-04: 6 mL via INTRAVENOUS

## 2024-05-23 ENCOUNTER — Other Ambulatory Visit: Payer: Self-pay | Admitting: Internal Medicine

## 2024-05-23 DIAGNOSIS — F5101 Primary insomnia: Secondary | ICD-10-CM

## 2024-05-31 ENCOUNTER — Telehealth: Payer: Self-pay | Admitting: Pharmacy Technician

## 2024-05-31 NOTE — Telephone Encounter (Signed)
 Pharmacy Patient Advocate Encounter  Received notification from [facility] that Prior Authorization for repatha  has been APPROVED from 05/31/24 to 05/31/25  [facility]

## 2024-05-31 NOTE — Telephone Encounter (Signed)
 Pharmacy Patient Advocate Encounter   Received notification from Onbase that prior authorization for repatha  is required/requested.   Insurance verification completed.   The patient is insured through [facility] .   Per test claim: PA required; PA submitted to above mentioned insurance via Fax Key/confirmation #/EOC faxed Status is pending

## 2024-06-17 ENCOUNTER — Telehealth: Payer: Self-pay | Admitting: Hematology and Oncology

## 2024-06-17 ENCOUNTER — Telehealth: Payer: Self-pay | Admitting: *Deleted

## 2024-06-17 NOTE — Telephone Encounter (Signed)
 Left patient a vm regarding upcoming appointment

## 2024-06-17 NOTE — Telephone Encounter (Signed)
 Spoke with patient to confirm appt for 06/18/24. Patient stated she needs to cancel appt. Patient given the number to call scheduling dept to reschedule appt.

## 2024-06-18 ENCOUNTER — Inpatient Hospital Stay: Payer: Self-pay | Admitting: Hematology and Oncology

## 2024-07-06 ENCOUNTER — Inpatient Hospital Stay: Attending: Hematology and Oncology | Admitting: Hematology and Oncology

## 2024-07-19 ENCOUNTER — Ambulatory Visit: Payer: 59 | Admitting: Internal Medicine

## 2024-07-28 ENCOUNTER — Encounter: Payer: Self-pay | Admitting: Internal Medicine

## 2024-07-28 ENCOUNTER — Ambulatory Visit: Admitting: Internal Medicine

## 2024-07-28 VITALS — BP 122/80 | HR 65 | Temp 98.3°F | Ht 62.0 in | Wt 141.4 lb

## 2024-07-28 DIAGNOSIS — E7800 Pure hypercholesterolemia, unspecified: Secondary | ICD-10-CM

## 2024-07-28 DIAGNOSIS — D508 Other iron deficiency anemias: Secondary | ICD-10-CM | POA: Diagnosis not present

## 2024-07-28 DIAGNOSIS — I10 Essential (primary) hypertension: Secondary | ICD-10-CM

## 2024-07-28 DIAGNOSIS — F5101 Primary insomnia: Secondary | ICD-10-CM

## 2024-07-28 DIAGNOSIS — F419 Anxiety disorder, unspecified: Secondary | ICD-10-CM

## 2024-07-28 LAB — CMP14+EGFR
ALT: 20 IU/L (ref 0–32)
AST: 26 IU/L (ref 0–40)
Albumin: 4.1 g/dL (ref 3.8–4.9)
Alkaline Phosphatase: 76 IU/L (ref 44–121)
BUN/Creatinine Ratio: 15 (ref 9–23)
BUN: 11 mg/dL (ref 6–24)
Bilirubin Total: 0.6 mg/dL (ref 0.0–1.2)
CO2: 23 mmol/L (ref 20–29)
Calcium: 9.2 mg/dL (ref 8.7–10.2)
Chloride: 102 mmol/L (ref 96–106)
Creatinine, Ser: 0.74 mg/dL (ref 0.57–1.00)
Globulin, Total: 2.8 g/dL (ref 1.5–4.5)
Glucose: 91 mg/dL (ref 70–99)
Potassium: 4.2 mmol/L (ref 3.5–5.2)
Sodium: 140 mmol/L (ref 134–144)
Total Protein: 6.9 g/dL (ref 6.0–8.5)
eGFR: 98 mL/min/1.73 (ref >59)

## 2024-07-28 LAB — LIPID PANEL
Chol/HDL Ratio: 3.3 ratio (ref 0.0–4.4)
Cholesterol, Total: 198 mg/dL (ref 100–199)
HDL: 60 mg/dL (ref >39)
LDL Chol Calc (NIH): 123 mg/dL — ABNORMAL HIGH (ref 0–99)
Triglycerides: 82 mg/dL (ref 0–149)
VLDL Cholesterol Cal: 15 mg/dL (ref 5–40)

## 2024-07-28 LAB — CBC
Hematocrit: 33.4 % — ABNORMAL LOW (ref 34.0–46.6)
Hemoglobin: 10.3 g/dL — ABNORMAL LOW (ref 11.1–15.9)
MCH: 28.5 pg (ref 26.6–33.0)
MCHC: 30.8 g/dL — ABNORMAL LOW (ref 31.5–35.7)
MCV: 92 fL (ref 79–97)
Platelets: 484 x10E3/uL — ABNORMAL HIGH (ref 150–450)
RBC: 3.62 x10E6/uL — ABNORMAL LOW (ref 3.77–5.28)
RDW: 14.8 % (ref 11.7–15.4)
WBC: 5.7 x10E3/uL (ref 3.4–10.8)

## 2024-07-28 MED ORDER — VALSARTAN-HYDROCHLOROTHIAZIDE 160-12.5 MG PO TABS: 1.0000 | ORAL_TABLET | Freq: Every day | ORAL | 2 refills | Status: AC

## 2024-07-28 MED ORDER — ESZOPICLONE 3 MG PO TABS: ORAL_TABLET | ORAL | 1 refills | Status: AC

## 2024-07-28 NOTE — Progress Notes (Signed)
 I,Victoria T Emmitt, CMA,acting as a Neurosurgeon for [name] LOISE Slocumb, MD.,have documented all relevant documentation on the behalf of [name] LOISE Slocumb, MD,as directed by  [name] LOISE Slocumb, MD while in the presence of [name] LOISE Slocumb, MD.  Subjective:  Patient ID: Susan [name] , female    DOB: [birth year] , 53 y.o.   MRN: [id number]  Chief Complaint  Patient presents with   Hyperlipidemia    Patient presents today for chol & bo follow up. She reports compliance with medications. Denies headache, chest pain & sob. She has no specific questions or concerns.    Hypertension    HPI Discussed the use of AI scribe software for clinical note transcription with the patient, who gave verbal consent to proceed.  History of Present Illness Susan [name] is a 53 year old female who presents for a follow-up visit to discuss her medication and lab work.  She has been experiencing a sensation of feeling 'slower' and missed her follow-up appointment in July, which led her to become concerned about her iron  levels. She is worried about her hair, describing it as 'extremely thin'. She is not taking any supplements like collagen.  She continues to take Lunesta  and requests a refill. She is also on Repatha , though not as frequently as prescribed, and is awaiting cholesterol results to evaluate its effectiveness. Additionally, she is taking valsartan , which is due for a refill.  She began menstruating at [age] years old and continues to have menstrual cycles at 61, which she finds unusual. She inquired about hormone levels. She humorously mentioned not wanting to continue cycling into her sixties.  Her work schedule is Monday through Friday, 8 to 5, with early dismissal at 2 PM on Fridays. She works in [place] but was in the area for her appointment. She has received flu shots at a pharmacy in the past and requires them for work.   Hypertension This is a chronic problem. The current episode started more than 1  year ago. The problem has been gradually improving since onset. The problem is controlled. Pertinent negatives include no blurred vision, chest pain or palpitations. Risk factors for coronary artery disease include obesity and stress. Past treatments include angiotensin blockers and diuretics. The current treatment provides moderate improvement.     Past Medical History:  Diagnosis Date   Anemia 2016   with bleeding ulcer   Arthritis    oa hands   Constipation    Dizziness    GERD (gastroesophageal reflux disease)    no current meds   GI bleeding    Glaucoma    Headache    headaches and migraines   History of bleeding ulcers    History of stomach ulcers    Hypertension    Hypothyroidism    Insomnia    Iron  deficiency    Irregular periods    Osteoarthritis of both hands    PCOS (polycystic ovarian syndrome)    Pre-diabetes    Syncope    Vitamin D  deficiency      Family History  Problem Relation Age of Onset   Hypertension Mother    Cancer Mother        pancreatic   Stroke Mother    Diabetes Mother    Diabetes gravidarum Mother    High blood pressure Mother    Heart disease Mother    Obesity Mother    Hypertension Father    Ulcers Father    Diabetes Father    Thyroid  disease Father  Hypertension Brother    CAD Maternal Grandmother    Kidney cancer Maternal Grandmother    Diabetes Paternal Grandmother    Heart attack Paternal Grandfather      Current Outpatient Medications:    Evolocumab  (REPATHA  SURECLICK) 140 MG/ML SOAJ, INJECT 140MG  UNDER THE SKIN EVERY 14 DAYS, Disp: 2 mL, Rfl: 6   Iron -FA-B Cmp-C-Biot-Probiotic (FUSION PLUS) CAPS, TAKE 1 CAPSULE BY MOUTH EVERY DAY, Disp: 30 capsule, Rfl: 4   omeprazole (PRILOSEC) 20 MG capsule, Take 20 mg by mouth daily., Disp: , Rfl:    Eszopiclone  3 MG TABS, TAKE 1 TABLET BY MOUTH IMMEDIATELY BEFORE BEDTIME, Disp: 30 tablet, Rfl: 1   valsartan -hydrochlorothiazide  (DIOVAN  HCT) 160-12.5 MG tablet, Take 1 tablet by mouth  daily., Disp: 90 tablet, Rfl: 2   Allergies  Allergen Reactions   Crestor  [Rosuvastatin ]     Myalgias      Review of Systems  Constitutional:  Positive for fatigue.  HENT: Negative.    Eyes: Negative.  Negative for blurred vision.  Respiratory: Negative.    Cardiovascular: Negative.  Negative for chest pain and palpitations.  Gastrointestinal: Negative.   Neurological: Negative.   Psychiatric/Behavioral: Negative.       Today's Vitals   07/28/24 0951  BP: 122/80  Pulse: 65  Temp: 98.3 F (36.8 C)  SpO2: 98%  Weight: 141 lb 6.4 oz (64.1 kg)  Height: 5' 2 (1.575 m)   Body mass index is 25.86 kg/m.  Wt Readings from Last 3 Encounters:  07/28/24 141 lb 6.4 oz (64.1 kg)  01/20/24 142 lb 3.2 oz (64.5 kg)  12/19/23 140 lb 3.2 oz (63.6 kg)     Objective:  Physical Exam Vitals and nursing note reviewed.  Constitutional:      Appearance: Normal appearance.  HENT:     Head: Normocephalic and atraumatic.  Eyes:     Extraocular Movements: Extraocular movements intact.  Cardiovascular:     Rate and Rhythm: Normal rate and regular rhythm.     Heart sounds: Normal heart sounds.  Pulmonary:     Effort: Pulmonary effort is normal.     Breath sounds: Normal breath sounds.  Musculoskeletal:     Cervical back: Normal range of motion.  Skin:    General: Skin is warm.  Neurological:     General: No focal deficit present.     Mental Status: She is alert.  Psychiatric:        Mood and Affect: Mood normal.        Behavior: Behavior normal.         Assessment And Plan:  Essential hypertension Assessment & Plan: Chronic, controlled.  She will continue with valsartan /hct 160/12.5mg  daily.  She is encouraged to follow low sodium diet.  She will f/u in six months for re-evaluation.   Orders: -     CMP14+EGFR -     Lipid panel -     Valsartan -hydroCHLOROthiazide ; Take 1 tablet by mouth daily.  Dispense: 90 tablet; Refill: 2  Pure hypercholesterolemia Assessment &  Plan: Currently on Repatha ; will assess cholesterol levels for treatment efficacy. - Order cholesterol test.  Orders: -     CMP14+EGFR -     Lipid panel  Primary insomnia Assessment & Plan: Continues Lunesta  for insomnia management. - Refill Lunesta  prescription.  Orders: -     Eszopiclone ; TAKE 1 TABLET BY MOUTH IMMEDIATELY BEFORE BEDTIME  Dispense: 30 tablet; Refill: 1  Other iron  deficiency anemia Assessment & Plan: Reports feeling slower, missed follow-up, hair thinning  possibly related to iron  levels. - Order iron  level test.  Orders: -     CBC -     Ferritin -     Iron  and TIBC   Return for 6 month chol f/u.SABRA  Patient was given opportunity to ask questions. Patient verbalized understanding of the plan and was able to repeat key elements of the plan. All questions were answered to their satisfaction.   I, [name] LOISE Slocumb, MD, have reviewed all documentation for this visit. The documentation on 07/28/24 for the exam, diagnosis, procedures, and orders are all accurate and complete.   IF YOU HAVE BEEN REFERRED TO A SPECIALIST, IT MAY TAKE 1-2 WEEKS TO SCHEDULE/PROCESS THE REFERRAL. IF YOU HAVE NOT HEARD FROM US /SPECIALIST IN TWO WEEKS, PLEASE GIVE US  A CALL AT [phone] X [phone].   THE PATIENT IS ENCOURAGED TO PRACTICE SOCIAL DISTANCING DUE TO THE COVID-19 PANDEMIC.

## 2024-07-28 NOTE — Patient Instructions (Signed)
 Cholesterol Content in Foods Cholesterol is a waxy, fat-like substance that helps to carry fat in the blood. The body needs cholesterol in small amounts, but too much cholesterol can cause damage to the arteries and heart. What foods have cholesterol?  Cholesterol is found in animal-based foods, such as meat, seafood, and dairy. Generally, low-fat dairy and lean meats have less cholesterol than full-fat dairy and fatty meats. The milligrams of cholesterol per serving (mg per serving) of common cholesterol-containing foods are listed below. Meats and other proteins Egg -- one large whole egg has 186 mg. Veal shank -- 4 oz (113 g) has 141 mg. Lean ground [place] (93% lean) -- 4 oz (113 g) has 118 mg. Fat-trimmed lamb loin -- 4 oz (113 g) has 106 mg. Lean ground beef (90% lean) -- 4 oz (113 g) has 100 mg. Lobster -- 3.5 oz (99 g) has 90 mg. Pork loin chops -- 4 oz (113 g) has 86 mg. Canned salmon -- 3.5 oz (99 g) has 83 mg. Fat-trimmed beef top loin -- 4 oz (113 g) has 78 mg. Frankfurter -- 1 frank (3.5 oz or 99 g) has 77 mg. Crab -- 3.5 oz (99 g) has 71 mg. Roasted chicken without skin, white meat -- 4 oz (113 g) has 66 mg. Light bologna -- 2 oz (57 g) has 45 mg. Deli-cut [place] -- 2 oz (57 g) has 31 mg. Canned tuna -- 3.5 oz (99 g) has 31 mg. Tomasa Blase -- 1 oz (28 g) has 29 mg. Oysters and mussels (raw) -- 3.5 oz (99 g) has 25 mg. Mackerel -- 1 oz (28 g) has 22 mg. Trout -- 1 oz (28 g) has 20 mg. Pork sausage -- 1 link (1 oz or 28 g) has 17 mg. Salmon -- 1 oz (28 g) has 16 mg. Tilapia -- 1 oz (28 g) has 14 mg. Dairy Soft-serve ice cream --  cup (4 oz or 86 g) has 103 mg. Whole-milk yogurt -- 1 cup (8 oz or 245 g) has 29 mg. Cheddar cheese -- 1 oz (28 g) has 28 mg. American cheese -- 1 oz (28 g) has 28 mg. Whole milk -- 1 cup (8 oz or 250 mL) has 23 mg. 2% milk -- 1 cup (8 oz or 250 mL) has 18 mg. Cream cheese -- 1 tablespoon (Tbsp) (14.5 g) has 15 mg. Cottage cheese --  cup (4 oz or  113 g) has 14 mg. Low-fat (1%) milk -- 1 cup (8 oz or 250 mL) has 10 mg. Sour cream -- 1 Tbsp (12 g) has 8.5 mg. Low-fat yogurt -- 1 cup (8 oz or 245 g) has 8 mg. Nonfat Greek yogurt -- 1 cup (8 oz or 228 g) has 7 mg. Half-and-half cream -- 1 Tbsp (15 mL) has 5 mg. Fats and oils Cod liver oil -- 1 tablespoon (Tbsp) (13.6 g) has 82 mg. Butter -- 1 Tbsp (14 g) has 15 mg. Lard -- 1 Tbsp (12.8 g) has 14 mg. Bacon grease -- 1 Tbsp (12.9 g) has 14 mg. Mayonnaise -- 1 Tbsp (13.8 g) has 5-10 mg. Margarine -- 1 Tbsp (14 g) has 3-10 mg. The items listed above may not be a complete list of foods with cholesterol. Exact amounts of cholesterol in these foods may vary depending on specific ingredients and brands. Contact a dietitian for more information. What foods do not have cholesterol? Most plant-based foods do not have cholesterol unless you combine them with a food that has  cholesterol. Foods without cholesterol include: Grains and cereals. Vegetables. Fruits. Vegetable oils, such as olive, canola, and sunflower oil. Legumes, such as peas, beans, and lentils. Nuts and seeds. Egg whites. The items listed above may not be a complete list of foods that do not have cholesterol. Contact a dietitian for more information. Summary The body needs cholesterol in small amounts, but too much cholesterol can cause damage to the arteries and heart. Cholesterol is found in animal-based foods, such as meat, seafood, and dairy. Generally, low-fat dairy and lean meats have less cholesterol than full-fat dairy and fatty meats. This information is not intended to replace advice given to you by your health care provider. Make sure you discuss any questions you have with your health care provider. Document Revised: 03/23/2021 Document Reviewed: 03/23/2021 Elsevier Patient Education  2024 [employer].

## 2024-07-29 LAB — FERRITIN: Ferritin: 23 ng/mL (ref 15–150)

## 2024-07-29 LAB — IRON AND TIBC
Iron Saturation: 4 % — CL (ref 15–55)
Iron: 21 ug/dL — ABNORMAL LOW (ref 27–159)
Total Iron Binding Capacity: 479 ug/dL — ABNORMAL HIGH (ref 250–450)
UIBC: 458 ug/dL — ABNORMAL HIGH (ref 131–425)

## 2024-08-01 DIAGNOSIS — F5101 Primary insomnia: Secondary | ICD-10-CM | POA: Insufficient documentation

## 2024-08-01 NOTE — Assessment & Plan Note (Signed)
 Continues Lunesta  for insomnia management. - Refill Lunesta  prescription.

## 2024-08-01 NOTE — Assessment & Plan Note (Signed)
 Reports feeling slower, missed follow-up, hair thinning possibly related to iron  levels. - Order iron  level test.

## 2024-08-01 NOTE — Assessment & Plan Note (Signed)
 Currently on Repatha ; will assess cholesterol levels for treatment efficacy. - Order cholesterol test.

## 2024-08-01 NOTE — Assessment & Plan Note (Signed)
 Chronic, controlled.  She will continue with valsartan /hct 160/12.5mg  daily.  She is encouraged to follow low sodium diet.  She will f/u in six months for re-evaluation.

## 2024-08-03 ENCOUNTER — Ambulatory Visit: Payer: Self-pay | Admitting: Internal Medicine

## 2024-08-17 ENCOUNTER — Telehealth: Payer: Self-pay

## 2024-08-17 NOTE — Telephone Encounter (Signed)
 Pt called and reports she feels she needs iron  and this is reflected on her 9/3 labs performed per her PCP. She would like Dr Susan [name] to review and advise. She missed her f/u in August and did not r/s. Advised pt I would make MD aware of her lab results and let her know a plan once MD communicates orders with me. She is agreeable.

## 2024-08-18 ENCOUNTER — Other Ambulatory Visit: Payer: Self-pay | Admitting: Hematology and Oncology

## 2024-08-18 NOTE — Progress Notes (Signed)
 Venofer  orders placed for [name] infusion clinic Pt should come back and see us  in approximately 3 months with repeat labs.  Susan [name]

## 2024-08-19 ENCOUNTER — Telehealth (HOSPITAL_COMMUNITY): Payer: Self-pay

## 2024-08-19 ENCOUNTER — Other Ambulatory Visit (HOSPITAL_COMMUNITY): Payer: Self-pay | Admitting: Hematology and Oncology

## 2024-08-19 NOTE — Telephone Encounter (Signed)
 Auth Submission: NO AUTH NEEDED Site of care: Site of care: [facility] Payer: [employer] Medication & CPT/J Code(s) submitted: Venofer  (Iron  Sucrose) J1756 Diagnosis Code: D50.9 Route of submission (phone, fax, portal):  Phone # Fax # Auth type: Buy/Bill HB Units/visits requested: 300mg  x 3 doses Reference number:  Approval from: 08/19/24 to 11/24/24

## 2024-08-24 ENCOUNTER — Encounter (HOSPITAL_COMMUNITY): Payer: Self-pay | Admitting: Hematology and Oncology

## 2024-08-25 ENCOUNTER — Ambulatory Visit (HOSPITAL_COMMUNITY)
Admission: RE | Admit: 2024-08-25 | Discharge: 2024-08-25 | Disposition: A | Discharge status: Home / Self Care | Source: Ambulatory Visit | Attending: Hematology and Oncology | Admitting: Hematology and Oncology

## 2024-08-25 VITALS — BP 126/94 | HR 72 | Temp 98.6°F | Resp 16

## 2024-08-25 DIAGNOSIS — D500 Iron deficiency anemia secondary to blood loss (chronic): Secondary | ICD-10-CM | POA: Diagnosis present

## 2024-08-25 MED ORDER — IRON SUCROSE 300 MG IVPB - SIMPLE MED
300.0000 mg | Freq: Once | Status: AC
  Administered 2024-08-25: 300 mg via INTRAVENOUS
  Filled 2024-08-25: qty 300

## 2024-09-01 ENCOUNTER — Ambulatory Visit (HOSPITAL_COMMUNITY)
Admission: RE | Admit: 2024-09-01 | Discharge: 2024-09-01 | Disposition: A | Discharge status: Home / Self Care | Source: Ambulatory Visit | Attending: Hematology and Oncology | Admitting: Hematology and Oncology

## 2024-09-01 VITALS — BP 135/109 | HR 75 | Temp 98.4°F | Resp 16

## 2024-09-01 DIAGNOSIS — D500 Iron deficiency anemia secondary to blood loss (chronic): Secondary | ICD-10-CM | POA: Diagnosis present

## 2024-09-01 MED ORDER — IRON SUCROSE 300 MG IVPB - SIMPLE MED
300.0000 mg | Freq: Once | Status: AC
  Administered 2024-09-01: 300 mg via INTRAVENOUS
  Filled 2024-09-01: qty 300

## 2024-09-06 NOTE — Progress Notes (Signed)
 Chief Complaint: Patient was seen in consultation today for symptomatic uterine fibroids   Referring Physician(s): Cousins,Sheronette  History of Present Illness: Susan [name] is a 53 y.o. female with a medical history significant for HTN, insomnia, GI bleeding, gastric sleeve, polycystic ovarian syndrome and uterine fibroids with menorrhagia. She also has a history of iron  deficiency anemia requiring intermittent iron  infusions.   She was referred to Interventional Radiology for possible uterine artery embolization and an MR pelvis was ordered for further work up. This was obtained 05/04/24 and the patient presents to the clinic today to discuss these results.    Past Medical History:  Diagnosis Date   Anemia 2016   with bleeding ulcer   Arthritis    oa hands   Constipation    Dizziness    GERD (gastroesophageal reflux disease)    no current meds   GI bleeding    Glaucoma    Headache    headaches and migraines   History of bleeding ulcers    History of stomach ulcers    Hypertension    Hypothyroidism    Insomnia    Iron  deficiency    Irregular periods    Osteoarthritis of both hands    PCOS (polycystic ovarian syndrome)    Pre-diabetes    Syncope    Vitamin D  deficiency     Past Surgical History:  Procedure Laterality Date   BILATERAL SALPINGECTOMY Bilateral 03/22/2013   Procedure: BILATERAL SALPINGECTOMY;  Surgeon: Dickie DELENA Carder, MD;  Location: [facility];  Service: Gynecology;  Laterality: Bilateral;   CARPAL TUNNEL RELEASE Right 11/15/2013   Procedure: RIGHT CARPAL TUNNEL RELEASE;  Surgeon: Arley JONELLE Curia, MD;  Location: [facility];  Service: Orthopedics;  Laterality: Right;   COSMETIC SURGERY  01/2015   tummy tuck    ESOPHAGOGASTRODUODENOSCOPY N/A 10/04/2015   Procedure: ESOPHAGOGASTRODUODENOSCOPY (EGD);  Surgeon: Elspeth Deward Naval, MD;  Location: [facility];  Service: Gastroenterology;  Laterality: N/A;   GANGLION CYST EXCISION  Right 11/15/2013   Procedure: EXCISION CYST RIGHT WRIST;  Surgeon: Arley JONELLE Curia, MD;  Location: [facility];  Service: Orthopedics;  Laterality: Right;   LAPAROSCOPIC GASTRIC BAND REMOVAL WITH LAPAROSCOPIC GASTRIC SLEEVE RESECTION N/A 09/23/2017   Procedure: LAPAROSCOPIC GASTRIC BAND REMOVAL WITH LAPAROSCOPIC GASTRIC SLEEVE RESECTION;  Surgeon: Gladis Cough, MD;  Location: [facility];  Service: General;  Laterality: N/A;   LAPAROSCOPIC GASTRIC BANDING  2010   LAPAROSCOPY N/A 03/22/2013   Procedure: LAPAROSCOPY OPERATIVE;  Surgeon: Dickie DELENA Carder, MD;  Location: [facility];  Service: Gynecology;  Laterality: N/A;   MOUTH SURGERY  1991   SALPINGECTOMY  03/22/2013    Allergies: Crestor  [rosuvastatin ]  Medications: Prior to Admission medications   Medication Sig Start Date End Date Taking? Authorizing Provider  Eszopiclone  3 MG TABS TAKE 1 TABLET BY MOUTH IMMEDIATELY BEFORE BEDTIME 07/28/24   Jarold Medici, MD  Evolocumab  (REPATHA  SURECLICK) 140 MG/ML SOAJ INJECT 140MG  UNDER THE SKIN EVERY 14 DAYS 07/29/23   Vannie Reche RAMAN, NP  Iron -FA-B Cmp-C-Biot-Probiotic (FUSION PLUS) CAPS TAKE 1 CAPSULE BY MOUTH EVERY DAY 04/28/24   Jarold Medici, MD  omeprazole (PRILOSEC) 20 MG capsule Take 20 mg by mouth daily.    [provider]  valsartan -hydrochlorothiazide  (DIOVAN  HCT) 160-12.5 MG tablet Take 1 tablet by mouth daily. 07/28/24 07/28/25  Jarold Medici, MD     Family History  Problem Relation Age of Onset   Hypertension Mother    Cancer Mother  pancreatic   Stroke Mother    Diabetes Mother    Diabetes gravidarum Mother    High blood pressure Mother    Heart disease Mother    Obesity Mother    Hypertension Father    Ulcers Father    Diabetes Father    Thyroid  disease Father    Hypertension Brother    CAD Maternal Grandmother    Kidney cancer Maternal Grandmother    Diabetes Paternal Grandmother    Heart attack Paternal Grandfather     Social History    Socioeconomic History   Marital status: Married    Spouse name: Librarian, academic   Number of children: Not on file   Years of education: Not on file   Highest education level: Professional school degree (e.g., MD, DDS, DVM, JD)  Occupational History   Occupation: Pediatric Dentist  Tobacco Use   Smoking status: Never   Smokeless tobacco: Never  Vaping Use   Vaping status: Never Used  Substance and Sexual Activity   Alcohol use: Yes    Alcohol/week: 1.0 standard drink of alcohol    Types: 1 Standard drinks or equivalent per week    Comment: socially   Drug use: No   Sexual activity: Not on file  Other Topics Concern   Not on file  Social History Narrative   Not on file   Social Drivers of Health   Financial Resource Strain: Low Risk  (08/04/2023)   Overall Financial Resource Strain (CARDIA)    Difficulty of Paying Living Expenses: Not very hard  Food Insecurity: No Food Insecurity (08/04/2023)   Hunger Vital Sign    Worried About Running Out of Food in the Last Year: Never true    Ran Out of Food in the Last Year: Never true  Transportation Needs: No Transportation Needs (08/04/2023)   PRAPARE - Administrator, Civil Service (Medical): No    Lack of Transportation (Non-Medical): No  Physical Activity: Unknown (08/04/2023)   Exercise Vital Sign    Days of Exercise per Week: 0 days    Minutes of Exercise per Session: Not on file  Stress: Stress Concern Present (08/04/2023)   [employer] of Occupational Health - Occupational Stress Questionnaire    Feeling of Stress : To some extent  Social Connections: Socially Integrated (08/04/2023)   Social Connection and Isolation Panel    Frequency of Communication with Friends and Family: More than three times a week    Frequency of Social Gatherings with Friends and Family: Twice a week    Attends Religious Services: 1 to 4 times per year    Active Member of [employer] or Organizations: Yes    Attends Engineer, structural:  More than 4 times per year    Marital Status: Married    Review of Systems: A 12 point ROS discussed and pertinent positives are indicated in the HPI above.  All other systems are negative.  Review of Systems  Vital Signs: There were no vitals taken for this visit.   Physical Exam  Imaging:  MR Pelvis 05/04/24      IMPRESSION: 1. Mildly enlarged uterus with several fibroids, largest measuring 5.4 cm in the posterior fundus. No intracavitary or pedunculated fibroids identified. 2. Normal appearance of both ovaries. No adnexal mass identified  Labs:  CBC: Recent Labs    12/19/23 0921 07/28/24 1041  WBC 4.6 5.7  HGB 8.3* 10.3*  HCT 27.5* 33.4*  PLT 474* 484*    COAGS: No results  for input(s): INR, APTT in the last 8760 hours.  BMP: Recent Labs    10/31/23 0917 01/20/24 1555 07/28/24 1041  NA 142 141 140  K 4.0 3.9 4.2  CL 98 104 102  CO2 35* 24 23  GLUCOSE 85 83 91  BUN 10 9 11   CALCIUM  8.9 9.7 9.2  CREATININE 0.79 0.80 0.74    LIVER FUNCTION TESTS: Recent Labs    01/20/24 1555 07/28/24 1041  BILITOT 0.4 0.6  AST 26 26  ALT 23 20  ALKPHOS 73 76  PROT 7.5 6.9  ALBUMIN 4.4 4.1    TUMOR MARKERS: No results for input(s): AFPTM, CEA, CA199, CHROMGRNA in the last 8760 hours.  Assessment and Plan:  53 year old female with a history of uterine fibroids with menorrhagia and iron  deficiency anemia.   Thank you for this interesting consult.  I greatly enjoyed meeting [facility] and look forward to participating in their care.  A copy of this report was sent to the requesting provider on this date.  Electronically Signed: Warren JONELLE Dais, NP 09/06/2024, 1:09 PM   I spent a total of  40 Minutes   in face to face in clinical consultation, greater than 50% of which was counseling/coordinating care for symptomatic uterine fibroids.

## 2024-09-08 ENCOUNTER — Ambulatory Visit
Admission: RE | Admit: 2024-09-08 | Discharge: 2024-09-08 | Disposition: A | Discharge status: Home / Self Care | Source: Ambulatory Visit | Attending: Obstetrics and Gynecology | Admitting: Obstetrics and Gynecology

## 2024-09-08 DIAGNOSIS — D251 Intramural leiomyoma of uterus: Secondary | ICD-10-CM

## 2024-09-08 DIAGNOSIS — N920 Excessive and frequent menstruation with regular cycle: Secondary | ICD-10-CM

## 2024-09-08 HISTORY — PX: IR RADIOLOGIST EVAL & MGMT: IMG5224

## 2024-09-09 ENCOUNTER — Ambulatory Visit (HOSPITAL_COMMUNITY)
Admission: RE | Admit: 2024-09-09 | Discharge: 2024-09-09 | Disposition: A | Discharge status: Home / Self Care | Source: Ambulatory Visit | Attending: Hematology and Oncology | Admitting: Hematology and Oncology

## 2024-09-09 VITALS — BP 130/70 | HR 80 | Temp 98.5°F | Resp 17

## 2024-09-09 DIAGNOSIS — D500 Iron deficiency anemia secondary to blood loss (chronic): Secondary | ICD-10-CM | POA: Diagnosis not present

## 2024-09-09 MED ORDER — IRON SUCROSE 300 MG IVPB - SIMPLE MED
300.0000 mg | Freq: Once | Status: AC
  Administered 2024-09-09: 300 mg via INTRAVENOUS
  Filled 2024-09-09: qty 300

## 2024-09-10 ENCOUNTER — Other Ambulatory Visit: Payer: Self-pay | Admitting: Internal Medicine

## 2024-09-10 DIAGNOSIS — Z1231 Encounter for screening mammogram for malignant neoplasm of breast: Secondary | ICD-10-CM

## 2024-09-15 ENCOUNTER — Other Ambulatory Visit: Payer: Self-pay | Admitting: Interventional Radiology

## 2024-09-15 ENCOUNTER — Other Ambulatory Visit (HOSPITAL_BASED_OUTPATIENT_CLINIC_OR_DEPARTMENT_OTHER): Payer: Self-pay | Admitting: Family

## 2024-09-15 DIAGNOSIS — D259 Leiomyoma of uterus, unspecified: Secondary | ICD-10-CM

## 2024-09-15 DIAGNOSIS — E7849 Other hyperlipidemia: Secondary | ICD-10-CM

## 2024-09-24 ENCOUNTER — Ambulatory Visit
Admission: RE | Admit: 2024-09-24 | Discharge: 2024-09-24 | Disposition: A | Discharge status: Home / Self Care | Source: Ambulatory Visit | Attending: Internal Medicine | Admitting: Internal Medicine

## 2024-09-24 DIAGNOSIS — Z1231 Encounter for screening mammogram for malignant neoplasm of breast: Secondary | ICD-10-CM

## 2024-09-29 ENCOUNTER — Telehealth: Payer: Self-pay

## 2024-09-29 ENCOUNTER — Telehealth (HOSPITAL_COMMUNITY): Payer: Self-pay | Admitting: Student

## 2024-09-29 MED ORDER — IBUPROFEN 800 MG PO TABS: 800.0000 mg | ORAL_TABLET | Freq: Three times a day (TID) | ORAL | 0 refills | Status: AC

## 2024-09-29 MED ORDER — KETOROLAC TROMETHAMINE 10 MG PO TABS: 10.0000 mg | ORAL_TABLET | Freq: Four times a day (QID) | ORAL | 0 refills | Status: AC

## 2024-09-29 MED ORDER — PROMETHAZINE HCL 12.5 MG PO TABS: 12.5000 mg | ORAL_TABLET | ORAL | 0 refills | Status: AC | PRN

## 2024-09-29 MED ORDER — DOCUSATE SODIUM 100 MG PO CAPS: 100.0000 mg | ORAL_CAPSULE | Freq: Two times a day (BID) | ORAL | 0 refills | Status: DC

## 2024-09-29 MED ORDER — OXYCODONE-ACETAMINOPHEN 5-325 MG PO TABS: 1.0000 | ORAL_TABLET | ORAL | 0 refills | Status: AC | PRN

## 2024-09-29 NOTE — Telephone Encounter (Signed)
 Patient scheduled for uterine artery embolization 10/01/24 with Dr. Jennefer. Post-procedure medications e-prescribed to the patient's preferred pharmacy. These medications were reviewed with the patient and she is aware these are for POST procedure.   She knows she can call the clinic with any questions/concerns prior to her procedure on Friday.  Benjamyn Hestand, AGACNP-BC 09/29/2024, 10:10 AM

## 2024-09-29 NOTE — Progress Notes (Signed)
 See telephone note

## 2024-09-29 NOTE — Discharge Instructions (Signed)
 Uterine Artery Embolization After Care    After the procedure, it is common to have mild pain or discomfort at the arterial entry site. It is also common to experience uterine cramping. These cramps can vary in strength from what you would consider to be a bad menstrual cycle all the way up to as severe as labor pains. The cramping is typically the most severe the afternoon and evening the day of the procedure and begin to improve the next day and each day thereafter. You may have vaginal bleeding. Your cycle may become irregular the first several months. You may have vaginal discharge. We recommend you wear a panty liner for first 4-6 weeks following your procedure.  You could also experience nausea and vomiting.  Take your medicines exactly as told, at the same time every day. This is vital to helping you with a smooth recovery.   Toradol  10mg  is a non-steroidal anti-inflammatory medicine that is critical in keeping your inflammation and pain under control.  You must take this every 6 hours for 3 days.  Motrin /Advill 800mg  is also a non-steroidal anti-inflammatory medicine that is critical in keeping your inflammation and pain under control.  You must take this every 8 hours for 4 days AFTER Toradol  has been completed.  Percocet (oxycodone /acetaminophen ) is a combination narcotic pain medication with Tylenol  to help with your pain.  Take it every 4 hours regardless of your pain level the first 2 days.  Set an alarm to wake up so you don't miss a dose overnight and get behind on your pain control.  After the first 48 hrs, if your pain is minimal you can take this only as needed.    Colace (docusate sodium) 100mg  is a stool softener to help prevent constipation.  The pain medications often cause constipation which can be particularly uncomfortable after [place].  We recommend you take this at twice a day as prescribed while you are using Oxycodone /Percocet.   Phenergan (promethazine) 12.5mg  is a  medication for nausea and/or vomiting.  Take this every 4 hours as needed.   You may resume your regular prescription medications, as well.  Leave your bandage on for 24 hrs and keep the area dry. You may remove the bandage after 24 hrs and then shower as normal. Do not submerge in a bath, pool or hot tub until the small incision is completely healed (5-7 days). Do not lift anything that is heavier than 5 lb (2.3 kg) for the first 3 days. Return to your normal activities after day 5, though progress slowly and listen to your body.   Many women find a hot water bottle or heating pad helpful when placed on the lower abdomen.  This is fine to do if it helps your cramps.  Do not use any products that contain nicotine or tobacco. These products include cigarettes, chewing tobacco, and vaping devices, such as e-cigarettes. These can delay incision healing. If you need help quitting, ask your health care provider.  You may resume sexual activity when you feel up to it. Do not drink alcohol until you no longer are taking narcotic pain medicine.    Please contact our office at [phone] for any signs of an infection, such as a fever greater than 100 degrees, redness/swelling/pain around the incision site, fluid or blood oozing from the incision site, if the incision feels warm to the touch, if you have pus or a bad smell coming from your incision or vagina, if you have a rash,  if you have nausea or cannot eat anything without vomiting or if you have continued vaginal discharge that is not getting lighter.  If you need to speak to someone after business hours or over the weekend, please call the on-call Interventional Radiologist service at [phone]. Tell them you are a patient of Dr. Terrill and you had a Uterine Artery Embolization, along with any issues you are having.    Thanks for visiting [facility]!

## 2024-09-29 NOTE — Progress Notes (Signed)
See Noted

## 2024-09-30 ENCOUNTER — Ambulatory Visit: Payer: Self-pay | Admitting: Internal Medicine

## 2024-09-30 NOTE — H&P (Signed)
 Chief Complaint: Patient was seen in consultation today for symptomatic uterine fibroids  Referring Physician(s): Cousins,Sheronette   Supervising Physician: Jennefer Rover  Patient Status: [facility] - Outpatient   History of Present Illness: Susan [name] is a 53 y.o. female with a medical history significant for HTN, insomnia, GI bleeding, gastric sleeve, polycystic ovarian syndrome and uterine fibroids with menorrhagia. She also has a history of iron  deficiency anemia requiring intermittent iron  infusions.    She was referred to Interventional Radiology for possible uterine artery embolization and an MR pelvis was ordered for further work up. This was obtained 05/04/24 and she met with Dr. Jennefer 09/08/24. She reported regular cycles with heaving bleeding and cramping. She wished to avoid a major surgery due to the recovery time.   Dr. Jennefer explained the UAE procedure in detail and reviewed the risks, benefits and alternatives. She was in agreement to proceed.   Past Medical History:  Diagnosis Date   Anemia 2016   with bleeding ulcer   Arthritis    oa hands   Constipation    Dizziness    GERD (gastroesophageal reflux disease)    no current meds   GI bleeding    Glaucoma    Headache    headaches and migraines   History of bleeding ulcers    History of stomach ulcers    Hypertension    Hypothyroidism    Insomnia    Iron  deficiency    Irregular periods    Osteoarthritis of both hands    PCOS (polycystic ovarian syndrome)    Pre-diabetes    Syncope    Vitamin D  deficiency     Past Surgical History:  Procedure Laterality Date   BILATERAL SALPINGECTOMY Bilateral 03/22/2013   Procedure: BILATERAL SALPINGECTOMY;  Surgeon: Dickie DELENA Carder, MD;  Location: [facility];  Service: Gynecology;  Laterality: Bilateral;   CARPAL TUNNEL RELEASE Right 11/15/2013   Procedure: RIGHT CARPAL TUNNEL RELEASE;  Surgeon: Arley JONELLE Curia, MD;  Location: [facility];  Service: Orthopedics;  Laterality: Right;   COSMETIC SURGERY  01/2015   tummy tuck    ESOPHAGOGASTRODUODENOSCOPY N/A 10/04/2015   Procedure: ESOPHAGOGASTRODUODENOSCOPY (EGD);  Surgeon: Elspeth Deward Naval, MD;  Location: [facility];  Service: Gastroenterology;  Laterality: N/A;   GANGLION CYST EXCISION Right 11/15/2013   Procedure: EXCISION CYST RIGHT WRIST;  Surgeon: Arley JONELLE Curia, MD;  Location: [facility];  Service: Orthopedics;  Laterality: Right;   IR RADIOLOGIST EVAL & MGMT  09/08/2024   LAPAROSCOPIC GASTRIC BAND REMOVAL WITH LAPAROSCOPIC GASTRIC SLEEVE RESECTION N/A 09/23/2017   Procedure: LAPAROSCOPIC GASTRIC BAND REMOVAL WITH LAPAROSCOPIC GASTRIC SLEEVE RESECTION;  Surgeon: Gladis Cough, MD;  Location: [facility];  Service: General;  Laterality: N/A;   LAPAROSCOPIC GASTRIC BANDING  2010   LAPAROSCOPY N/A 03/22/2013   Procedure: LAPAROSCOPY OPERATIVE;  Surgeon: Dickie DELENA Carder, MD;  Location: [facility];  Service: Gynecology;  Laterality: N/A;   MOUTH SURGERY  1991   REDUCTION MAMMAPLASTY Bilateral 2023   SALPINGECTOMY  03/22/2013    Allergies: Crestor  [rosuvastatin ]  Medications: Prior to Admission medications   Medication Sig Start Date End Date Taking? Authorizing Provider  docusate sodium (COLACE) 100 MG capsule Take 1 capsule (100 mg total) by mouth 2 (two) times daily. Take this medication while taking oxycodone  or as needed for constipation. 09/29/24   Tyreek Clabo R, NP  Eszopiclone  3 MG TABS TAKE 1 TABLET BY MOUTH IMMEDIATELY BEFORE BEDTIME 07/28/24   Jarold Medici, MD  Evolocumab  (REPATHA  SURECLICK) 140 MG/ML SOAJ ADMINISTER 1 ML UNDER THE SKIN EVERY 14 DAYS 09/16/24   Raford Riggs, MD  ibuprofen (ADVIL) 800 MG tablet Take 1 tablet (800 mg total) by mouth every 8 (eight) hours for 4 days. Begin taking this medication AFTER completion of Toradol  prescription. 09/29/24 10/03/24  Tonette Warren SAUNDERS, NP  Iron -FA-B Cmp-C-Biot-Probiotic (FUSION  PLUS) CAPS TAKE 1 CAPSULE BY MOUTH EVERY DAY 04/28/24   Jarold Medici, MD  ketorolac  (TORADOL ) 10 MG tablet Take 1 tablet (10 mg total) by mouth every 6 (six) hours for 3 days. 09/29/24 10/02/24  Lacorey Brusca R, NP  omeprazole (PRILOSEC) 20 MG capsule Take 20 mg by mouth daily.    [provider]  oxyCODONE -acetaminophen  (PERCOCET/ROXICET) 5-325 MG tablet Take 1 tablet by mouth every 4 (four) hours as needed for up to 5 days for severe pain (pain score 7-10). 09/29/24 10/04/24  Kaitlynne Wenz R, NP  promethazine  (PHENERGAN ) 12.5 MG tablet Take 1 tablet (12.5 mg total) by mouth every 4 (four) hours as needed for nausea or vomiting. 09/29/24   Aryam Zhan R, NP  valsartan -hydrochlorothiazide  (DIOVAN  HCT) 160-12.5 MG tablet Take 1 tablet by mouth daily. 07/28/24 07/28/25  Jarold Medici, MD     Family History  Problem Relation Age of Onset   Hypertension Mother    Cancer Mother        pancreatic   Stroke Mother    Diabetes Mother    Diabetes gravidarum Mother    High blood pressure Mother    Heart disease Mother    Obesity Mother    Hypertension Father    Ulcers Father    Diabetes Father    Thyroid  disease Father    CAD Maternal Grandmother    Kidney cancer Maternal Grandmother    Diabetes Paternal Grandmother    Heart attack Paternal Grandfather    Breast cancer Cousin        Maternal 1 st cousin   Hypertension Brother     Social History   Socioeconomic History   Marital status: Married    Spouse name: Librarian, Academic   Number of children: Not on file   Years of education: Not on file   Highest education level: Professional school degree (e.g., MD, DDS, DVM, JD)  Occupational History   Occupation: Pediatric Dentist  Tobacco Use   Smoking status: Never   Smokeless tobacco: Never  Vaping Use   Vaping status: Never Used  Substance and Sexual Activity   Alcohol use: Yes    Alcohol/week: 1.0 standard drink of alcohol    Types: 1 Standard drinks or equivalent per week     Comment: socially   Drug use: No   Sexual activity: Not on file  Other Topics Concern   Not on file  Social History Narrative   Not on file   Social Drivers of Health   Financial Resource Strain: Low Risk  (08/04/2023)   Overall Financial Resource Strain (CARDIA)    Difficulty of Paying Living Expenses: Not very hard  Food Insecurity: No Food Insecurity (08/04/2023)   Hunger Vital Sign    Worried About Running Out of Food in the Last Year: Never true    Ran Out of Food in the Last Year: Never true  Transportation Needs: No Transportation Needs (08/04/2023)   PRAPARE - Administrator, Civil Service (Medical): No    Lack of Transportation (Non-Medical): No  Physical Activity: Unknown (08/04/2023)   Exercise Vital Sign    Days  of Exercise per Week: 0 days    Minutes of Exercise per Session: Not on file  Stress: Stress Concern Present (08/04/2023)   [employer] of Occupational Health - Occupational Stress Questionnaire    Feeling of Stress : To some extent  Social Connections: Socially Integrated (08/04/2023)   Social Connection and Isolation Panel    Frequency of Communication with Friends and Family: More than three times a week    Frequency of Social Gatherings with Friends and Family: Twice a week    Attends Religious Services: 1 to 4 times per year    Active Member of [employer] or Organizations: Yes    Attends Engineer, Structural: More than 4 times per year    Marital Status: Married    Review of Systems: A 12 point ROS discussed and pertinent positives are indicated in the HPI above.  All other systems are negative.  Review of Systems  Vital Signs: There were no vitals taken for this visit.  Physical Exam  Labs:  CBC: Recent Labs    12/19/23 0921 07/28/24 1041  WBC 4.6 5.7  HGB 8.3* 10.3*  HCT 27.5* 33.4*  PLT 474* 484*    COAGS: No results for input(s): INR, APTT in the last 8760 hours.  BMP: Recent Labs    10/31/23 0917  01/20/24 1555 07/28/24 1041  NA 142 141 140  K 4.0 3.9 4.2  CL 98 104 102  CO2 35* 24 23  GLUCOSE 85 83 91  BUN 10 9 11   CALCIUM  8.9 9.7 9.2  CREATININE 0.79 0.80 0.74    LIVER FUNCTION TESTS: Recent Labs    01/20/24 1555 07/28/24 1041  BILITOT 0.4 0.6  AST 26 26  ALT 23 20  ALKPHOS 73 76  PROT 7.5 6.9  ALBUMIN 4.4 4.1    TUMOR MARKERS: No results for input(s): AFPTM, CEA, CA199, CHROMGRNA in the last 8760 hours.  Assessment and Plan:  Symptomatic uterine fibroids: Susan [name], 53 year old female, presents today for an image-guided uterine artery embolization.   Risks and benefits of this procedure were discussed with the patient including, but not limited to bleeding, infection, vascular injury or contrast induced renal failure.  All of the patient's questions were answered, patient is agreeable to proceed. She has been NPO.   Consent signed and in chart.  Thank you for this interesting consult.  I greatly enjoyed meeting [facility] and look forward to participating in their care.  A copy of this report was sent to the requesting provider on this date.  Electronically Signed: Warren Dais, AGACNP-BC 09/30/2024, 12:42 PM   I spent a total of  30 Minutes   in face to face in clinical consultation, greater than 50% of which was counseling/coordinating care for symptomatic uterine fibroids.

## 2024-10-01 ENCOUNTER — Ambulatory Visit
Admission: RE | Admit: 2024-10-01 | Discharge: 2024-10-01 | Disposition: A | Discharge status: Home / Self Care | Source: Ambulatory Visit | Attending: Interventional Radiology | Admitting: Interventional Radiology

## 2024-10-01 DIAGNOSIS — D259 Leiomyoma of uterus, unspecified: Secondary | ICD-10-CM

## 2024-10-01 HISTORY — PX: IR EMBO TUMOR ORGAN ISCHEMIA INFARCT INC GUIDE ROADMAPPING: IMG5449

## 2024-10-01 MED ORDER — NITROGLYCERIN 2 % TD OINT
0.5000 [in_us] | TOPICAL_OINTMENT | Freq: Once | TRANSDERMAL | Status: AC
  Administered 2024-10-01: 0.5 [in_us] via TOPICAL

## 2024-10-01 MED ORDER — MIDAZOLAM HCL (PF) 2 MG/2ML IJ SOLN: 1.0000 mg | INTRAMUSCULAR | Status: DC | PRN

## 2024-10-01 MED ORDER — KETOROLAC TROMETHAMINE 30 MG/ML IJ SOLN
30.0000 mg | Freq: Once | INTRAMUSCULAR | Status: AC
  Administered 2024-10-01: 30 mg via INTRAVENOUS

## 2024-10-01 MED ORDER — OXYCODONE HCL ER 10 MG PO T12A
10.0000 mg | EXTENDED_RELEASE_TABLET | Freq: Once | ORAL | Status: AC
  Administered 2024-10-01: 10 mg via ORAL

## 2024-10-01 MED ORDER — SODIUM CHLORIDE 0.9 % IV SOLN
8.0000 mg | Freq: Once | INTRAVENOUS | Status: AC
  Administered 2024-10-01: 8 mg via INTRAVENOUS

## 2024-10-01 MED ORDER — DEXAMETHASONE SOD PHOSPHATE PF 10 MG/ML IJ SOLN
10.0000 mg | Freq: Once | INTRAMUSCULAR | Status: AC
  Administered 2024-10-01: 10 mg via INTRAVENOUS

## 2024-10-01 MED ORDER — LIDOCAINE HCL (PF) 1 % IJ SOLN
5.0000 mL | Freq: Once | INTRAMUSCULAR | Status: AC
  Administered 2024-10-01: 5 mL

## 2024-10-01 MED ORDER — FENTANYL CITRATE (PF) 100 MCG/2ML IJ SOLN
INTRAMUSCULAR | Status: DC | PRN
  Administered 2024-10-01 (×2): 50 ug via INTRAVENOUS

## 2024-10-01 MED ORDER — VERAPAMIL HCL 2.5 MG/ML IV SOLN: Freq: Once | INTRA_ARTERIAL | Status: AC

## 2024-10-01 MED ORDER — IOPAMIDOL (ISOVUE-300) INJECTION 61%
100.0000 mL | Freq: Once | INTRAVENOUS | Status: AC | PRN
  Administered 2024-10-01: 80 mL via INTRA_ARTERIAL

## 2024-10-01 MED ORDER — MIDAZOLAM HCL (PF) 2 MG/2ML IJ SOLN
INTRAMUSCULAR | Status: DC | PRN
  Administered 2024-10-01: 1 mg via INTRAVENOUS

## 2024-10-01 MED ORDER — SODIUM CHLORIDE 0.9% FLUSH: 3.0000 mL | INTRAVENOUS | Status: DC | PRN

## 2024-10-01 MED ORDER — ACETAMINOPHEN 10 MG/ML IV SOLN
1000.0000 mg | Freq: Once | INTRAVENOUS | Status: AC
  Administered 2024-10-01: 1000 mg via INTRAVENOUS

## 2024-10-01 MED ORDER — CEFAZOLIN SODIUM-DEXTROSE 2-4 GM/100ML-% IV SOLN
2.0000 g | Freq: Once | INTRAVENOUS | Status: AC
  Administered 2024-10-01: 2 g via INTRAVENOUS

## 2024-10-01 MED ORDER — LIDOCAINE-EPINEPHRINE 1 %-1:100000 IJ SOLN
10.0000 mL | Freq: Once | INTRAMUSCULAR | Status: AC
  Administered 2024-10-01: 10 mL via INTRADERMAL

## 2024-10-01 MED ORDER — HYDROMORPHONE HCL 1 MG/ML IJ SOLN
1.0000 mg | Freq: Once | INTRAMUSCULAR | Status: AC
  Administered 2024-10-01: 1 mg via INTRAVENOUS

## 2024-10-01 MED ORDER — LIDOCAINE 5 % EX OINT: TOPICAL_OINTMENT | Freq: Once | CUTANEOUS | Status: AC

## 2024-10-01 MED ORDER — CHLORHEXIDINE GLUCONATE CLOTH 2 % EX PADS: 6.0000 | MEDICATED_PAD | Freq: Every day | CUTANEOUS | Status: DC

## 2024-10-01 MED ORDER — SODIUM CHLORIDE 0.9 % IV SOLN: INTRAVENOUS | Status: DC

## 2024-10-01 MED ORDER — FENTANYL CITRATE (PF) 50 MCG/ML IJ SOSY: 25.0000 ug | PREFILLED_SYRINGE | INTRAMUSCULAR | Status: DC | PRN

## 2024-10-01 MED ORDER — PANTOPRAZOLE SODIUM 40 MG IV SOLR
40.0000 mg | Freq: Once | INTRAVENOUS | Status: AC
  Administered 2024-10-01: 40 mg via INTRAVENOUS

## 2024-10-04 ENCOUNTER — Telehealth: Payer: Self-pay

## 2024-10-04 NOTE — Progress Notes (Signed)
 See telephone note

## 2024-10-07 ENCOUNTER — Telehealth (HOSPITAL_COMMUNITY): Payer: Self-pay | Admitting: Student

## 2024-10-07 MED ORDER — SULFAMETHOXAZOLE-TRIMETHOPRIM 800-160 MG PO TABS: 1.0000 | ORAL_TABLET | Freq: Two times a day (BID) | ORAL | 0 refills | Status: DC

## 2024-10-07 MED ORDER — PHENAZOPYRIDINE HCL 100 MG PO TABS: 200.0000 mg | ORAL_TABLET | Freq: Three times a day (TID) | ORAL | 0 refills | Status: AC

## 2024-10-07 NOTE — Telephone Encounter (Signed)
 Patient s/p uterine artery embolization 10/01/24 now with symptoms of a UTI. Patient states she has had prior UTIs and is very convinced she has one. She endorses pelvic pain/pressure, pain with urination, frequency and very little urine coming out at each visit to the bathroom.   Prescription for UTI (Bactrim DS) e-prescribed along with pyridium. Patient aware if her symptoms do not resolve she will need to be formally evaluated by her PCP or at Urgent Care. She verbalized understanding.  Warren Dais, AGACNP-BC 10/07/2024, 9:01 AM

## 2024-10-09 ENCOUNTER — Emergency Department (HOSPITAL_BASED_OUTPATIENT_CLINIC_OR_DEPARTMENT_OTHER)

## 2024-10-09 ENCOUNTER — Emergency Department (HOSPITAL_BASED_OUTPATIENT_CLINIC_OR_DEPARTMENT_OTHER)
Admission: EM | Admit: 2024-10-09 | Discharge: 2024-10-09 | Disposition: A | Discharge status: Home / Self Care | Attending: Emergency Medicine | Admitting: Emergency Medicine

## 2024-10-09 ENCOUNTER — Encounter (HOSPITAL_BASED_OUTPATIENT_CLINIC_OR_DEPARTMENT_OTHER): Payer: Self-pay

## 2024-10-09 DIAGNOSIS — D219 Benign neoplasm of connective and other soft tissue, unspecified: Secondary | ICD-10-CM

## 2024-10-09 DIAGNOSIS — J181 Lobar pneumonia, unspecified organism: Secondary | ICD-10-CM | POA: Diagnosis not present

## 2024-10-09 DIAGNOSIS — R110 Nausea: Secondary | ICD-10-CM | POA: Diagnosis present

## 2024-10-09 DIAGNOSIS — I10 Essential (primary) hypertension: Secondary | ICD-10-CM | POA: Diagnosis not present

## 2024-10-09 DIAGNOSIS — D259 Leiomyoma of uterus, unspecified: Secondary | ICD-10-CM | POA: Diagnosis not present

## 2024-10-09 DIAGNOSIS — J168 Pneumonia due to other specified infectious organisms: Secondary | ICD-10-CM | POA: Diagnosis not present

## 2024-10-09 DIAGNOSIS — J189 Pneumonia, unspecified organism: Secondary | ICD-10-CM

## 2024-10-09 DIAGNOSIS — E039 Hypothyroidism, unspecified: Secondary | ICD-10-CM | POA: Diagnosis not present

## 2024-10-09 DIAGNOSIS — Z79899 Other long term (current) drug therapy: Secondary | ICD-10-CM | POA: Diagnosis not present

## 2024-10-09 DIAGNOSIS — R1031 Right lower quadrant pain: Secondary | ICD-10-CM

## 2024-10-09 LAB — COMPREHENSIVE METABOLIC PANEL WITH GFR
ALT: 16 U/L (ref 0–44)
AST: 24 U/L (ref 15–41)
Albumin: 3.9 g/dL (ref 3.5–5.0)
Alkaline Phosphatase: 94 U/L (ref 38–126)
Anion gap: 10 (ref 5–15)
BUN: 9 mg/dL (ref 6–20)
CO2: 32 mmol/L (ref 22–32)
Calcium: 9.9 mg/dL (ref 8.9–10.3)
Chloride: 96 mmol/L — ABNORMAL LOW (ref 98–111)
Creatinine, Ser: 0.79 mg/dL (ref 0.44–1.00)
GFR, Estimated: >60 mL/min (ref >60)
Glucose, Bld: 99 mg/dL (ref 70–99)
Potassium: 3.4 mmol/L — ABNORMAL LOW (ref 3.5–5.1)
Sodium: 138 mmol/L (ref 135–145)
Total Bilirubin: 0.3 mg/dL (ref 0.0–1.2)
Total Protein: 7.7 g/dL (ref 6.5–8.1)

## 2024-10-09 LAB — URINALYSIS, ROUTINE W REFLEX MICROSCOPIC
Bilirubin Urine: NEGATIVE
Glucose, UA: NEGATIVE mg/dL
Ketones, ur: NEGATIVE mg/dL
Nitrite: NEGATIVE
Specific Gravity, Urine: 1.024 (ref 1.005–1.030)
pH: 7 (ref 5.0–8.0)

## 2024-10-09 LAB — CBC
HCT: 39.2 % (ref 36.0–46.0)
Hemoglobin: 12.7 g/dL (ref 12.0–15.0)
MCH: 28.9 pg (ref 26.0–34.0)
MCHC: 32.4 g/dL (ref 30.0–36.0)
MCV: 89.3 fL (ref 80.0–100.0)
Platelets: 381 K/uL (ref 150–400)
RBC: 4.39 MIL/uL (ref 3.87–5.11)
RDW: 17 % — ABNORMAL HIGH (ref 11.5–15.5)
WBC: 8.6 K/uL (ref 4.0–10.5)
nRBC: 0 % (ref 0.0–0.2)

## 2024-10-09 LAB — LIPASE, BLOOD: Lipase: 27 U/L (ref 11–51)

## 2024-10-09 MED ORDER — DOXYCYCLINE HYCLATE 100 MG PO TABS
100.0000 mg | ORAL_TABLET | Freq: Once | ORAL | Status: AC
  Administered 2024-10-09: 100 mg via ORAL
  Filled 2024-10-09: qty 1

## 2024-10-09 MED ORDER — DOXYCYCLINE HYCLATE 100 MG PO CAPS: 100.0000 mg | ORAL_CAPSULE | Freq: Two times a day (BID) | ORAL | 0 refills | Status: DC

## 2024-10-09 MED ORDER — KETOROLAC TROMETHAMINE 30 MG/ML IJ SOLN
30.0000 mg | Freq: Once | INTRAMUSCULAR | Status: AC
  Administered 2024-10-09: 30 mg via INTRAVENOUS
  Filled 2024-10-09: qty 1

## 2024-10-09 MED ORDER — SODIUM CHLORIDE 0.9 % IV BOLUS
1000.0000 mL | Freq: Once | INTRAVENOUS | Status: AC
  Administered 2024-10-09: 1000 mL via INTRAVENOUS

## 2024-10-09 MED ORDER — IOHEXOL 300 MG/ML  SOLN
100.0000 mL | Freq: Once | INTRAMUSCULAR | Status: AC | PRN
  Administered 2024-10-09: 100 mL via INTRAVENOUS

## 2024-10-09 NOTE — ED Provider Notes (Signed)
 [place] EMERGENCY DEPARTMENT AT [facility] Provider Note   CSN: [id number] Arrival date & time: 10/09/24  9163     Patient presents with: Abdominal Pain   Susan [name] is a 53 y.o. female.   Pt is a 53 yo female with pmhx significant for htn, GERD, arthritis, GI Bleed due to stomach ulcers, hypothyroidism, PCOS, and iron  deficiency due to fibroids.  She had an endometrial artery ablation done last friday, 11/7.  She has had severe RLQ pain since then.  She said she was told it was constipation, but it's not getting better.  She has some nausea, no vomiting.  She's not had much of an appetite.         Prior to Admission medications   Medication Sig Start Date End Date Taking? Authorizing Provider  doxycycline (VIBRAMYCIN) 100 MG capsule Take 1 capsule (100 mg total) by mouth 2 (two) times daily. 10/09/24  Yes Dean Clarity, MD  docusate sodium (COLACE) 100 MG capsule Take 1 capsule (100 mg total) by mouth 2 (two) times daily. Take this medication while taking oxycodone  or as needed for constipation. 09/29/24   Covington, Jamie R, NP  Eszopiclone  3 MG TABS TAKE 1 TABLET BY MOUTH IMMEDIATELY BEFORE BEDTIME 07/28/24   Jarold Medici, MD  Evolocumab  (REPATHA  SURECLICK) 140 MG/ML SOAJ ADMINISTER 1 ML UNDER THE SKIN EVERY 14 DAYS 09/16/24   Raford Riggs, MD  Iron -FA-B Cmp-C-Biot-Probiotic (FUSION PLUS) CAPS TAKE 1 CAPSULE BY MOUTH EVERY DAY 04/28/24   Jarold Medici, MD  omeprazole (PRILOSEC) 20 MG capsule Take 20 mg by mouth daily.    [provider]  phenazopyridine (PYRIDIUM) 100 MG tablet Take 2 tablets (200 mg total) by mouth in the morning, at noon, and at bedtime for 3 days. 10/07/24 10/10/24  Covington, Jamie R, NP  promethazine  (PHENERGAN ) 12.5 MG tablet Take 1 tablet (12.5 mg total) by mouth every 4 (four) hours as needed for nausea or vomiting. 09/29/24   Covington, Jamie R, NP  sulfamethoxazole-trimethoprim (BACTRIM DS) 800-160 MG tablet Take 1 tablet by  mouth 2 (two) times daily for 10 days. 10/07/24 10/17/24  Covington, Jamie R, NP  valsartan -hydrochlorothiazide  (DIOVAN  HCT) 160-12.5 MG tablet Take 1 tablet by mouth daily. 07/28/24 07/28/25  Jarold Medici, MD    Allergies: Crestor  [rosuvastatin ]    Review of Systems  Gastrointestinal:  Positive for abdominal pain and nausea.  All other systems reviewed and are negative.   Updated Vital Signs BP 122/86   Pulse 83   Temp 98.6 F (37 C) (Oral)   Resp (!) 22   LMP  (LMP Unknown)   SpO2 100%   Physical Exam Vitals and nursing note reviewed.  Constitutional:      Appearance: She is well-developed.  HENT:     Head: Normocephalic and atraumatic.     Mouth/Throat:     Mouth: Mucous membranes are moist.     Pharynx: Oropharynx is clear.  Eyes:     Extraocular Movements: Extraocular movements intact.     Pupils: Pupils are equal, round, and reactive to light.  Cardiovascular:     Rate and Rhythm: Normal rate and regular rhythm.     Heart sounds: Normal heart sounds.  Pulmonary:     Effort: Pulmonary effort is normal.     Breath sounds: Normal breath sounds.  Abdominal:     General: Abdomen is flat. Bowel sounds are normal.     Palpations: Abdomen is soft.     Tenderness: There is abdominal tenderness  in the right lower quadrant.  Skin:    General: Skin is warm.     Capillary Refill: Capillary refill takes less than 2 seconds.  Neurological:     General: No focal deficit present.     Mental Status: She is alert and oriented to person, place, and time.  Psychiatric:        Mood and Affect: Mood normal.     (all labs ordered are listed, but only abnormal results are displayed) Labs Reviewed  COMPREHENSIVE METABOLIC PANEL WITH GFR - Abnormal; Notable for the following components:      Result Value   Potassium 3.4 (*)    Chloride 96 (*)    All other components within normal limits  CBC - Abnormal; Notable for the following components:   RDW 17.0 (*)    All other  components within normal limits  URINALYSIS, ROUTINE W REFLEX MICROSCOPIC - Abnormal; Notable for the following components:   Hgb urine dipstick TRACE (*)    Protein, ur TRACE (*)    Leukocytes,Ua TRACE (*)    Bacteria, UA RARE (*)    All other components within normal limits  LIPASE, BLOOD    EKG: None  Radiology: CT ABDOMEN PELVIS W CONTRAST Result Date: 10/09/2024 CLINICAL DATA:  Right flank and lower quadrant pain. EXAM: CT ABDOMEN AND PELVIS WITH CONTRAST TECHNIQUE: Multidetector CT imaging of the abdomen and pelvis was performed using the standard protocol following bolus administration of intravenous contrast. RADIATION DOSE REDUCTION: This exam was performed according to the departmental dose-optimization program which includes automated exposure control, adjustment of the mA and/or kV according to patient size and/or use of iterative reconstruction technique. CONTRAST:  OMNIPAQUE  IOHEXOL  300 MG/ML  SOLN COMPARISON:  04/04/2023 FINDINGS: Lower chest: Focal airspace consolidation in the posterior right lower lobe is consistent with pneumonia. Hepatobiliary: No suspicious focal abnormality within the liver parenchyma. There is no evidence for gallstones, gallbladder wall thickening, or pericholecystic fluid. No intrahepatic or extrahepatic biliary dilation. Pancreas: No focal mass lesion. No dilatation of the main duct. No intraparenchymal cyst. No peripancreatic edema. Spleen: No splenomegaly. No suspicious focal mass lesion. Adrenals/Urinary Tract: No adrenal nodule or mass. Kidneys unremarkable. No evidence for hydroureter. The urinary bladder appears normal for the degree of distention. Stomach/Bowel: Status post bariatric surgery. No small bowel wall thickening. No small bowel dilatation. The terminal ileum is normal. The appendix is normal. No gross colonic mass. No colonic wall thickening. Vascular/Lymphatic: No abdominal aortic aneurysm. Portal vein and superior mesenteric vein  are patent. There is no gastrohepatic or hepatoduodenal ligament lymphadenopathy. No retroperitoneal or mesenteric lymphadenopathy. No pelvic sidewall lymphadenopathy. Reproductive: 6 x 06.9 x 5.8 cm large uterine fibroid evident. Multiple foci of branching gas attenuation are seen centrally within the fibroid. A smaller left-sided 2.3 cm fibroid evident. This fibroid measures larger than on pelvic MRI of 05/04/2024. Patient is status post fibroid embolization on 10/01/2024. There is marked thinning of the overlying myometrium at the uterine fundus, more prominent than on the prior study. No adnexal mass. Other: No substantial intraperitoneal free fluid. Musculoskeletal: No worrisome lytic or sclerotic osseous abnormality. IMPRESSION: 1. Focal airspace consolidation in the posterior right lower lobe is consistent with pneumonia. 2. 6 x 06.9 x 5.8 cm uterine fibroid appears edematous and has increased in size since previous MRI, likely as results of recent embolization. Multiple foci of branching gas attenuation are seen centrally within the fibroid inter most likely related to embolization although infection should be excluded. There  is marked thinning of the overlying myometrium at the uterine fundus, more prominent than on the prior study. No evidence for extra uterine fluid or debris at this time but close follow-up warranted. Electronically Signed   By: Camellia Candle M.D.   On: 10/09/2024 10:38     Procedures   Medications Ordered in the ED  ketorolac  (TORADOL ) 30 MG/ML injection 30 mg (has no administration in time range)  doxycycline (VIBRA-TABS) tablet 100 mg (has no administration in time range)  sodium chloride  0.9 % bolus 1,000 mL (1,000 mLs Intravenous New Bag/Given 10/09/24 0924)  iohexol  (OMNIPAQUE ) 300 MG/ML solution 100 mL (100 mLs Intravenous Contrast Given 10/09/24 0948)                                    Medical Decision Making Amount and/or Complexity of Data Reviewed Labs:  ordered. Radiology: ordered.  Risk Prescription drug management.   This patient presents to the ED for concern of rlq pain, this involves an extensive number of treatment options, and is a complaint that carries with it a high risk of complications and morbidity.  The differential diagnosis includes abscess, constipation, appy, uti   Co morbidities that complicate the patient evaluation  htn, GERD, arthritis, GI Bleed due to stomach ulcers, hypothyroidism, PCOS, and iron  deficiency due to fibroids   Additional history obtained:  Additional history obtained from epic chart review  Lab Tests:  I Ordered, and personally interpreted labs.  The pertinent results include:  cbc nl, ua with tr hgb, tr protein, 0-5 wbcs and rare bacteria   Imaging Studies ordered:  I ordered imaging studies including ct abd/pelvis  I independently visualized and interpreted imaging which showed  1. Focal airspace consolidation in the posterior right lower lobe is  consistent with pneumonia.  2. 6 x 06.9 x 5.8 cm uterine fibroid appears edematous and has  increased in size since previous MRI, likely as results of recent  embolization. Multiple foci of branching gas attenuation are seen  centrally within the fibroid inter most likely related to  embolization although infection should be excluded. There is marked  thinning of the overlying myometrium at the uterine fundus, more  prominent than on the prior study. No evidence for extra uterine  fluid or debris at this time but close follow-up warranted.   I agree with the radiologist interpretation   Medicines ordered and prescription drug management:  I ordered medication including ivfs/doxy  for sx  Reevaluation of the patient after these medicines showed that the patient improved I have reviewed the patients home medicines and have made adjustments as needed   Test Considered:  ct   Problem List / ED Course:  Abd pain:  likely due to  post-embolization edema.  I doubt infection clinically.  Pt does know to return if it worsens.  F/u with gyn. RLL pna:  pt has had a cough, but was ignoring it due to the abd pain.  She is started on doxy.     Reevaluation:  After the interventions noted above, I reevaluated the patient and found that they have :improved   Social Determinants of Health:  Lives at home   Dispostion:  After consideration of the diagnostic results and the patients response to treatment, I feel that the patent would benefit from discharge with outpatient f/u.       Final diagnoses:  Right lower quadrant abdominal pain  Fibroid  Pneumonia of right lower lobe due to infectious organism    ED Discharge Orders          Ordered    doxycycline (VIBRAMYCIN) 100 MG capsule  2 times daily        10/09/24 1058               Dot Splinter, MD 10/09/24 1101

## 2024-10-15 ENCOUNTER — Other Ambulatory Visit: Payer: Self-pay

## 2024-10-15 ENCOUNTER — Inpatient Hospital Stay (HOSPITAL_BASED_OUTPATIENT_CLINIC_OR_DEPARTMENT_OTHER)
Admission: EM | Admit: 2024-10-15 | Discharge: 2024-10-18 | DRG: 378 | Disposition: A | Discharge status: Home / Self Care | Attending: Internal Medicine | Admitting: Internal Medicine

## 2024-10-15 ENCOUNTER — Encounter (HOSPITAL_BASED_OUTPATIENT_CLINIC_OR_DEPARTMENT_OTHER): Payer: Self-pay | Admitting: Emergency Medicine

## 2024-10-15 DIAGNOSIS — M19041 Primary osteoarthritis, right hand: Secondary | ICD-10-CM | POA: Diagnosis present

## 2024-10-15 DIAGNOSIS — Z833 Family history of diabetes mellitus: Secondary | ICD-10-CM

## 2024-10-15 DIAGNOSIS — K289 Gastrojejunal ulcer, unspecified as acute or chronic, without hemorrhage or perforation: Secondary | ICD-10-CM

## 2024-10-15 DIAGNOSIS — D259 Leiomyoma of uterus, unspecified: Secondary | ICD-10-CM | POA: Diagnosis present

## 2024-10-15 DIAGNOSIS — Z8051 Family history of malignant neoplasm of kidney: Secondary | ICD-10-CM

## 2024-10-15 DIAGNOSIS — D62 Acute posthemorrhagic anemia: Secondary | ICD-10-CM | POA: Diagnosis present

## 2024-10-15 DIAGNOSIS — Z823 Family history of stroke: Secondary | ICD-10-CM

## 2024-10-15 DIAGNOSIS — Z803 Family history of malignant neoplasm of breast: Secondary | ICD-10-CM

## 2024-10-15 DIAGNOSIS — K922 Gastrointestinal hemorrhage, unspecified: Secondary | ICD-10-CM | POA: Diagnosis not present

## 2024-10-15 DIAGNOSIS — Z8249 Family history of ischemic heart disease and other diseases of the circulatory system: Secondary | ICD-10-CM

## 2024-10-15 DIAGNOSIS — E785 Hyperlipidemia, unspecified: Secondary | ICD-10-CM | POA: Diagnosis present

## 2024-10-15 DIAGNOSIS — D649 Anemia, unspecified: Secondary | ICD-10-CM

## 2024-10-15 DIAGNOSIS — Z8719 Personal history of other diseases of the digestive system: Secondary | ICD-10-CM

## 2024-10-15 DIAGNOSIS — R110 Nausea: Secondary | ICD-10-CM

## 2024-10-15 DIAGNOSIS — K254 Chronic or unspecified gastric ulcer with hemorrhage: Secondary | ICD-10-CM | POA: Diagnosis not present

## 2024-10-15 DIAGNOSIS — R519 Headache, unspecified: Secondary | ICD-10-CM | POA: Diagnosis present

## 2024-10-15 DIAGNOSIS — I10 Essential (primary) hypertension: Secondary | ICD-10-CM | POA: Diagnosis present

## 2024-10-15 DIAGNOSIS — K921 Melena: Secondary | ICD-10-CM | POA: Diagnosis present

## 2024-10-15 DIAGNOSIS — Z9884 Bariatric surgery status: Secondary | ICD-10-CM

## 2024-10-15 DIAGNOSIS — K5900 Constipation, unspecified: Secondary | ICD-10-CM | POA: Diagnosis present

## 2024-10-15 DIAGNOSIS — K2971 Gastritis, unspecified, with bleeding: Secondary | ICD-10-CM | POA: Diagnosis present

## 2024-10-15 DIAGNOSIS — K219 Gastro-esophageal reflux disease without esophagitis: Secondary | ICD-10-CM | POA: Diagnosis present

## 2024-10-15 DIAGNOSIS — E039 Hypothyroidism, unspecified: Secondary | ICD-10-CM | POA: Diagnosis present

## 2024-10-15 DIAGNOSIS — D509 Iron deficiency anemia, unspecified: Secondary | ICD-10-CM | POA: Diagnosis present

## 2024-10-15 DIAGNOSIS — Z888 Allergy status to other drugs, medicaments and biological substances status: Secondary | ICD-10-CM

## 2024-10-15 DIAGNOSIS — Z8349 Family history of other endocrine, nutritional and metabolic diseases: Secondary | ICD-10-CM

## 2024-10-15 DIAGNOSIS — M19042 Primary osteoarthritis, left hand: Secondary | ICD-10-CM | POA: Diagnosis present

## 2024-10-15 DIAGNOSIS — R7303 Prediabetes: Secondary | ICD-10-CM | POA: Diagnosis present

## 2024-10-15 LAB — COMPREHENSIVE METABOLIC PANEL WITH GFR
ALT: 40 U/L (ref 0–44)
AST: 36 U/L (ref 15–41)
Albumin: 4 g/dL (ref 3.5–5.0)
Alkaline Phosphatase: 84 U/L (ref 38–126)
Anion gap: 11 (ref 5–15)
BUN: 17 mg/dL (ref 6–20)
CO2: 29 mmol/L (ref 22–32)
Calcium: 9.9 mg/dL (ref 8.9–10.3)
Chloride: 99 mmol/L (ref 98–111)
Creatinine, Ser: 0.69 mg/dL (ref 0.44–1.00)
GFR, Estimated: >60 mL/min (ref >60)
Glucose, Bld: 106 mg/dL — ABNORMAL HIGH (ref 70–99)
Potassium: 3.2 mmol/L — ABNORMAL LOW (ref 3.5–5.1)
Sodium: 139 mmol/L (ref 135–145)
Total Bilirubin: 0.3 mg/dL (ref 0.0–1.2)
Total Protein: 7.1 g/dL (ref 6.5–8.1)

## 2024-10-15 LAB — CBC
HCT: 32.8 % — ABNORMAL LOW (ref 36.0–46.0)
Hemoglobin: 10.8 g/dL — ABNORMAL LOW (ref 12.0–15.0)
MCH: 29.3 pg (ref 26.0–34.0)
MCHC: 32.9 g/dL (ref 30.0–36.0)
MCV: 88.9 fL (ref 80.0–100.0)
Platelets: 575 K/uL — ABNORMAL HIGH (ref 150–400)
RBC: 3.69 MIL/uL — ABNORMAL LOW (ref 3.87–5.11)
RDW: 17.2 % — ABNORMAL HIGH (ref 11.5–15.5)
WBC: 10.4 K/uL (ref 4.0–10.5)
nRBC: 0 % (ref 0.0–0.2)

## 2024-10-15 MED ORDER — LACTATED RINGERS IV SOLN: INTRAVENOUS | Status: DC

## 2024-10-15 MED ORDER — SODIUM CHLORIDE 0.9 % IV BOLUS
1000.0000 mL | Freq: Once | INTRAVENOUS | Status: AC
  Administered 2024-10-15: 1000 mL via INTRAVENOUS

## 2024-10-15 MED ORDER — PANTOPRAZOLE SODIUM 40 MG IV SOLR
40.0000 mg | Freq: Once | INTRAVENOUS | Status: AC
  Administered 2024-10-15: 40 mg via INTRAVENOUS
  Filled 2024-10-15: qty 10

## 2024-10-15 MED ORDER — THIAMINE HCL 100 MG/ML IJ SOLN
100.0000 mg | Freq: Every day | INTRAMUSCULAR | Status: DC
  Filled 2024-10-15: qty 2

## 2024-10-15 MED ORDER — LORAZEPAM 2 MG/ML IJ SOLN: 1.0000 mg | INTRAMUSCULAR | Status: DC | PRN

## 2024-10-15 MED ORDER — PANTOPRAZOLE SODIUM 40 MG IV SOLR
40.0000 mg | Freq: Two times a day (BID) | INTRAVENOUS | Status: DC
  Administered 2024-10-15 – 2024-10-17 (×5): 40 mg via INTRAVENOUS
  Filled 2024-10-15 (×5): qty 10

## 2024-10-15 MED ORDER — THIAMINE MONONITRATE 100 MG PO TABS
100.0000 mg | ORAL_TABLET | Freq: Every day | ORAL | Status: DC
  Administered 2024-10-15: 100 mg via ORAL
  Filled 2024-10-15: qty 1

## 2024-10-15 MED ORDER — LORAZEPAM 1 MG PO TABS: 1.0000 mg | ORAL_TABLET | ORAL | Status: DC | PRN

## 2024-10-15 MED ORDER — ONDANSETRON HCL 4 MG/2ML IJ SOLN
4.0000 mg | Freq: Once | INTRAMUSCULAR | Status: AC
  Administered 2024-10-15: 4 mg via INTRAVENOUS
  Filled 2024-10-15: qty 2

## 2024-10-15 MED ORDER — FOLIC ACID 1 MG PO TABS
1.0000 mg | ORAL_TABLET | Freq: Every day | ORAL | Status: DC
  Administered 2024-10-15: 1 mg via ORAL
  Filled 2024-10-15: qty 1

## 2024-10-15 MED ORDER — POTASSIUM CHLORIDE 10 MEQ/100ML IV SOLN
10.0000 meq | INTRAVENOUS | Status: AC
  Administered 2024-10-15 (×3): 10 meq via INTRAVENOUS
  Filled 2024-10-15 (×3): qty 100

## 2024-10-15 MED ORDER — ADULT MULTIVITAMIN W/MINERALS CH
1.0000 | ORAL_TABLET | Freq: Every day | ORAL | Status: DC
  Administered 2024-10-15: 1 via ORAL
  Filled 2024-10-15: qty 1

## 2024-10-15 NOTE — Plan of Care (Addendum)
 [facility] emergency department to [facility]  transfer progressive unit:  53 year old female past medical history of anemia, previous GI bleed, chronic constipation, GERD, glucoma, essential hypertension, hyperlipidemia, irregular menstruation, osteoarthritis, PCOS, chronic alcohol use, prediabetic and history of syncope presents emergency department complaining of black tarry stool.  Patient reported black-colored stool started yesterday and it has been persistent.  Appears similar to when previously has upper GI bleed in 2017.  Reported she has been taking omeprazole  at home.  Also complaining about nausea without vomiting.  Denies any fever and chill.  Patient reported she is still drinking alcohol but has been cut down from previously from the heavy drinking pattern.  Patient reported she has similar episodes of GI bleed in the past and found to have GI ulcer.  Presentation to ED patient is hemodynamically stable. Lab, stable H&H 10.8 and 32, normal WBC and slightly platelet count 575.  Baseline hemoglobin around 12.72 days ago. MP showing low potassium 3.2 otherwise unremarkable.  In the ED patient received IV Protonix  and 1 L of NS bolus.  Dr. Pamella spoke with [facility] Dr. Margette recommended for hospital admission and plan for EGD or colonoscopy however it is not clear that it is going to be happen over this weekend. Patient is hemodynamically stable.  Hospitalist consulted for further evaluation management of GI bleed-melena and alcohol use.   [facility] will assume care on arrival to accepting facility. Until arrival, care as per EDP. However, [facility] available 24/7 for questions and assistance. Check [email or web address] for on-call coverage. Nursing staff, please call [facility] Admits & Consults System-Wide number under Amion on patient's arrival so appropriate admitting provider can evaluate the pt.   Author: Aiven Kampe, MD  [facility]

## 2024-10-15 NOTE — ED Provider Notes (Signed)
 [place] [facility] AT [facility] Provider Note   CSN: [id number] Arrival date & time: 10/15/24  1215     Patient presents with: Rectal Bleeding   Susan [name] is a 53 y.o. female.  With a history of upper GI bleeding alcohol use and anemia who presents to the ED for dark stools.  Dark stools beginning yesterday persisting since the onset.  Dark and tarry.  Appears similar to when she had upper GI bleeding ulcer back in 2017.  Compliant with omeprazole  at home.  Some mild nausea no vomiting.  No fevers chills.  Still drinking alcohol but has cut back from previous heavy drinking patterns.  Now drinks couple times a month.    Rectal Bleeding      Prior to Admission medications   Medication Sig Start Date End Date Taking? Authorizing Provider  docusate sodium  (COLACE) 100 MG capsule Take 1 capsule (100 mg total) by mouth 2 (two) times daily. Take this medication while taking oxycodone  or as needed for constipation. 09/29/24   Covington, Jamie R, NP  doxycycline  (VIBRAMYCIN ) 100 MG capsule Take 1 capsule (100 mg total) by mouth 2 (two) times daily. 10/09/24   Dean Clarity, MD  Eszopiclone  3 MG TABS TAKE 1 TABLET BY MOUTH IMMEDIATELY BEFORE BEDTIME 07/28/24   Jarold Medici, MD  Evolocumab  (REPATHA  SURECLICK) 140 MG/ML SOAJ ADMINISTER 1 ML UNDER THE SKIN EVERY 14 DAYS 09/16/24   Raford Riggs, MD  Iron -FA-B Cmp-C-Biot-Probiotic (FUSION PLUS) CAPS TAKE 1 CAPSULE BY MOUTH EVERY DAY 04/28/24   Jarold Medici, MD  omeprazole  (PRILOSEC) 20 MG capsule Take 20 mg by mouth daily.    [provider]  promethazine  (PHENERGAN ) 12.5 MG tablet Take 1 tablet (12.5 mg total) by mouth every 4 (four) hours as needed for nausea or vomiting. 09/29/24   Covington, Jamie R, NP  sulfamethoxazole -trimethoprim  (BACTRIM  DS) 800-160 MG tablet Take 1 tablet by mouth 2 (two) times daily for 10 days. 10/07/24 10/17/24  Covington, Jamie R, NP  valsartan -hydrochlorothiazide  (DIOVAN   HCT) 160-12.5 MG tablet Take 1 tablet by mouth daily. 07/28/24 07/28/25  Jarold Medici, MD    Allergies: Crestor  [rosuvastatin ]    Review of Systems  Gastrointestinal:  Positive for hematochezia.    Updated Vital Signs BP 110/75   Pulse 84   Temp 98.3 F (36.8 C)   Resp 16   Ht 5' 2 (1.575 m)   Wt 61.2 kg   LMP  (LMP Unknown)   SpO2 99%   BMI 24.69 kg/m   Physical Exam Vitals and nursing note reviewed.  HENT:     Head: Normocephalic and atraumatic.  Eyes:     Pupils: Pupils are equal, round, and reactive to light.  Cardiovascular:     Rate and Rhythm: Normal rate and regular rhythm.  Pulmonary:     Effort: Pulmonary effort is normal.     Breath sounds: Normal breath sounds.  Abdominal:     Palpations: Abdomen is soft.     Tenderness: There is no abdominal tenderness.  Skin:    General: Skin is warm and dry.  Neurological:     Mental Status: She is alert.  Psychiatric:        Mood and Affect: Mood normal.     (all labs ordered are listed, but only abnormal results are displayed) Labs Reviewed  COMPREHENSIVE METABOLIC PANEL WITH GFR - Abnormal; Notable for the following components:      Result Value   Potassium 3.2 (*)    Glucose,  Bld 106 (*)    All other components within normal limits  CBC - Abnormal; Notable for the following components:   RBC 3.69 (*)    Hemoglobin 10.8 (*)    HCT 32.8 (*)    RDW 17.2 (*)    Platelets 575 (*)    All other components within normal limits  ETHANOL  ABO/RH    EKG: None  Radiology: No results found.   Procedures   Medications Ordered in the ED  potassium chloride  10 mEq in 100 mL IVPB (10 mEq Intravenous New Bag/Given 10/15/24 1922)  LORazepam  (ATIVAN ) tablet 1-4 mg (has no administration in time range)    Or  LORazepam  (ATIVAN ) injection 1-4 mg (has no administration in time range)  thiamine  (VITAMIN B1) tablet 100 mg (100 mg Oral Given 10/15/24 1921)    Or  thiamine  (VITAMIN B1) injection 100 mg (  Intravenous See Alternative 10/15/24 1921)  folic acid  (FOLVITE ) tablet 1 mg (1 mg Oral Given 10/15/24 1928)  multivitamin with minerals tablet 1 tablet (1 tablet Oral Given 10/15/24 1921)  lactated ringers  infusion ( Intravenous New Bag/Given 10/15/24 1924)  pantoprazole  (PROTONIX ) injection 40 mg (has no administration in time range)  sodium chloride  0.9 % bolus 1,000 mL (0 mLs Intravenous Stopped 10/15/24 1828)  pantoprazole  (PROTONIX ) injection 40 mg (40 mg Intravenous Given 10/15/24 1620)    Clinical Course as of 10/15/24 2019  Fri Oct 15, 2024  1812 Upon further consideration the patient does agree it would be safest for her to stay in the hospital with plan for endoscopy during admission.  She is now amenable with plan for admission.  I let covering physician for [facility] who she has seen in the past (Dr.Cirigliano) know about this plan and he is in agreement.  Will admit to medicine [MP]  1919 Discussed with admitting hospitalist who accepts patient for admission [MP]    Clinical Course User Index [MP] Pamella Ozell LABOR, DO                                 Medical Decision Making 53 year old female with history presenting for recurrent GI bleeding.  Dark tarry stools x 2 days.  Feels similar to prior episodes of bleeding ulcers.  Still drinking alcohol.  Compliant with omeprazole .  Hemodynamically stable.  Hemoglobin of 10.8 down from 12.7 6 days ago.  Discussed rationale for plan for admission with urgent GI endoscopy with the patient.  However she does not wish for admission at this time wants to follow-up in the GI office.  Will give her IV fluids and Protonix  and let her think about a little bit more  Amount and/or Complexity of Data Reviewed Labs: ordered.  Risk Prescription drug management. Decision regarding hospitalization.        Final diagnoses:  Upper GI bleeding    ED Discharge Orders     None          Pamella Ozell LABOR, DO 10/15/24 2020

## 2024-10-15 NOTE — ED Notes (Signed)
 Provided patient with apple juice and snacks upon patient's request and Dr. Penna's approval.

## 2024-10-15 NOTE — ED Triage Notes (Signed)
 Pt c/o blood in stool since yesterday described as dark tarry. PMH anemia and lower GI bleed. Minimal pain at present.

## 2024-10-16 ENCOUNTER — Encounter (HOSPITAL_COMMUNITY): Payer: Self-pay | Admitting: Internal Medicine

## 2024-10-16 ENCOUNTER — Inpatient Hospital Stay (HOSPITAL_COMMUNITY): Admitting: Registered Nurse

## 2024-10-16 ENCOUNTER — Encounter (HOSPITAL_COMMUNITY): Admission: EM | Disposition: A | Payer: Self-pay | Source: Home / Self Care | Attending: Internal Medicine

## 2024-10-16 DIAGNOSIS — K2970 Gastritis, unspecified, without bleeding: Secondary | ICD-10-CM | POA: Diagnosis not present

## 2024-10-16 DIAGNOSIS — E039 Hypothyroidism, unspecified: Secondary | ICD-10-CM

## 2024-10-16 DIAGNOSIS — R519 Headache, unspecified: Secondary | ICD-10-CM | POA: Diagnosis present

## 2024-10-16 DIAGNOSIS — F419 Anxiety disorder, unspecified: Secondary | ICD-10-CM | POA: Diagnosis not present

## 2024-10-16 DIAGNOSIS — Z803 Family history of malignant neoplasm of breast: Secondary | ICD-10-CM | POA: Diagnosis not present

## 2024-10-16 DIAGNOSIS — K9189 Other postprocedural complications and disorders of digestive system: Secondary | ICD-10-CM

## 2024-10-16 DIAGNOSIS — R110 Nausea: Secondary | ICD-10-CM

## 2024-10-16 DIAGNOSIS — I10 Essential (primary) hypertension: Secondary | ICD-10-CM

## 2024-10-16 DIAGNOSIS — Z8249 Family history of ischemic heart disease and other diseases of the circulatory system: Secondary | ICD-10-CM | POA: Diagnosis not present

## 2024-10-16 DIAGNOSIS — K922 Gastrointestinal hemorrhage, unspecified: Secondary | ICD-10-CM | POA: Diagnosis present

## 2024-10-16 DIAGNOSIS — K289 Gastrojejunal ulcer, unspecified as acute or chronic, without hemorrhage or perforation: Secondary | ICD-10-CM | POA: Diagnosis not present

## 2024-10-16 DIAGNOSIS — D509 Iron deficiency anemia, unspecified: Secondary | ICD-10-CM | POA: Diagnosis present

## 2024-10-16 DIAGNOSIS — K219 Gastro-esophageal reflux disease without esophagitis: Secondary | ICD-10-CM | POA: Diagnosis present

## 2024-10-16 DIAGNOSIS — Z9884 Bariatric surgery status: Secondary | ICD-10-CM

## 2024-10-16 DIAGNOSIS — M19041 Primary osteoarthritis, right hand: Secondary | ICD-10-CM | POA: Diagnosis present

## 2024-10-16 DIAGNOSIS — R1013 Epigastric pain: Secondary | ICD-10-CM | POA: Diagnosis not present

## 2024-10-16 DIAGNOSIS — K921 Melena: Secondary | ICD-10-CM | POA: Diagnosis present

## 2024-10-16 DIAGNOSIS — E785 Hyperlipidemia, unspecified: Secondary | ICD-10-CM | POA: Diagnosis present

## 2024-10-16 DIAGNOSIS — D259 Leiomyoma of uterus, unspecified: Secondary | ICD-10-CM | POA: Diagnosis present

## 2024-10-16 DIAGNOSIS — D62 Acute posthemorrhagic anemia: Secondary | ICD-10-CM

## 2024-10-16 DIAGNOSIS — M19042 Primary osteoarthritis, left hand: Secondary | ICD-10-CM | POA: Diagnosis present

## 2024-10-16 DIAGNOSIS — K254 Chronic or unspecified gastric ulcer with hemorrhage: Secondary | ICD-10-CM | POA: Diagnosis present

## 2024-10-16 DIAGNOSIS — Z8719 Personal history of other diseases of the digestive system: Secondary | ICD-10-CM | POA: Diagnosis not present

## 2024-10-16 DIAGNOSIS — K2971 Gastritis, unspecified, with bleeding: Secondary | ICD-10-CM | POA: Diagnosis present

## 2024-10-16 DIAGNOSIS — R7303 Prediabetes: Secondary | ICD-10-CM | POA: Diagnosis present

## 2024-10-16 DIAGNOSIS — Z8051 Family history of malignant neoplasm of kidney: Secondary | ICD-10-CM | POA: Diagnosis not present

## 2024-10-16 DIAGNOSIS — Z888 Allergy status to other drugs, medicaments and biological substances status: Secondary | ICD-10-CM | POA: Diagnosis not present

## 2024-10-16 DIAGNOSIS — D649 Anemia, unspecified: Secondary | ICD-10-CM

## 2024-10-16 DIAGNOSIS — Z823 Family history of stroke: Secondary | ICD-10-CM | POA: Diagnosis not present

## 2024-10-16 DIAGNOSIS — Z8349 Family history of other endocrine, nutritional and metabolic diseases: Secondary | ICD-10-CM | POA: Diagnosis not present

## 2024-10-16 DIAGNOSIS — Z833 Family history of diabetes mellitus: Secondary | ICD-10-CM | POA: Diagnosis not present

## 2024-10-16 DIAGNOSIS — K5900 Constipation, unspecified: Secondary | ICD-10-CM | POA: Diagnosis present

## 2024-10-16 HISTORY — PX: ESOPHAGOGASTRODUODENOSCOPY: SHX5428

## 2024-10-16 LAB — CBC
HCT: 26.8 % — ABNORMAL LOW (ref 36.0–46.0)
Hemoglobin: 8.6 g/dL — ABNORMAL LOW (ref 12.0–15.0)
MCH: 28.6 pg (ref 26.0–34.0)
MCHC: 32.1 g/dL (ref 30.0–36.0)
MCV: 89 fL (ref 80.0–100.0)
Platelets: 517 K/uL — ABNORMAL HIGH (ref 150–400)
RBC: 3.01 MIL/uL — ABNORMAL LOW (ref 3.87–5.11)
RDW: 17.4 % — ABNORMAL HIGH (ref 11.5–15.5)
WBC: 14 K/uL — ABNORMAL HIGH (ref 4.0–10.5)
nRBC: 0 % (ref 0.0–0.2)

## 2024-10-16 LAB — CBC WITH DIFFERENTIAL/PLATELET
Abs Immature Granulocytes: 0.07 K/uL (ref 0.00–0.07)
Basophils Absolute: 0.1 K/uL (ref 0.0–0.1)
Basophils Relative: 1 %
Eosinophils Absolute: 0.5 K/uL (ref 0.0–0.5)
Eosinophils Relative: 4 %
HCT: 25.3 % — ABNORMAL LOW (ref 36.0–46.0)
Hemoglobin: 8.4 g/dL — ABNORMAL LOW (ref 12.0–15.0)
Immature Granulocytes: 1 %
Lymphocytes Relative: 24 %
Lymphs Abs: 3 K/uL (ref 0.7–4.0)
MCH: 29.3 pg (ref 26.0–34.0)
MCHC: 33.2 g/dL (ref 30.0–36.0)
MCV: 88.2 fL (ref 80.0–100.0)
Monocytes Absolute: 0.8 K/uL (ref 0.1–1.0)
Monocytes Relative: 6 %
Neutro Abs: 8.3 K/uL — ABNORMAL HIGH (ref 1.7–7.7)
Neutrophils Relative %: 64 %
Platelets: 452 K/uL — ABNORMAL HIGH (ref 150–400)
RBC: 2.87 MIL/uL — ABNORMAL LOW (ref 3.87–5.11)
RDW: 17.2 % — ABNORMAL HIGH (ref 11.5–15.5)
WBC: 12.7 K/uL — ABNORMAL HIGH (ref 4.0–10.5)
nRBC: 0 % (ref 0.0–0.2)

## 2024-10-16 LAB — BASIC METABOLIC PANEL WITH GFR
Anion gap: 11 (ref 5–15)
BUN: 18 mg/dL (ref 6–20)
CO2: 23 mmol/L (ref 22–32)
Calcium: 8.5 mg/dL — ABNORMAL LOW (ref 8.9–10.3)
Chloride: 107 mmol/L (ref 98–111)
Creatinine, Ser: 0.68 mg/dL (ref 0.44–1.00)
GFR, Estimated: >60 mL/min (ref >60)
Glucose, Bld: 90 mg/dL (ref 70–99)
Potassium: 3.7 mmol/L (ref 3.5–5.1)
Sodium: 141 mmol/L (ref 135–145)

## 2024-10-16 LAB — ETHANOL: Alcohol, Ethyl (B): <15 mg/dL (ref <15)

## 2024-10-16 SURGERY — EGD (ESOPHAGOGASTRODUODENOSCOPY)
Anesthesia: Monitor Anesthesia Care | Laterality: Left

## 2024-10-16 MED ORDER — SODIUM CHLORIDE (PF) 0.9 % IJ SOLN
INTRAMUSCULAR | Status: DC | PRN
  Administered 2024-10-16: 3 mL

## 2024-10-16 MED ORDER — ONDANSETRON HCL 4 MG PO TABS: 4.0000 mg | ORAL_TABLET | Freq: Four times a day (QID) | ORAL | Status: DC | PRN

## 2024-10-16 MED ORDER — LIDOCAINE 2% (20 MG/ML) 5 ML SYRINGE
INTRAMUSCULAR | Status: DC | PRN
  Administered 2024-10-16: 100 mg via INTRAVENOUS

## 2024-10-16 MED ORDER — OXYCODONE-ACETAMINOPHEN 5-325 MG PO TABS
1.0000 | ORAL_TABLET | ORAL | Status: DC | PRN
Start: 2024-10-16 — End: 2024-10-18

## 2024-10-16 MED ORDER — SODIUM CHLORIDE 0.9 % IV SOLN
2.0000 g | INTRAVENOUS | Status: DC
  Administered 2024-10-16: 2 g via INTRAVENOUS
  Filled 2024-10-16: qty 20

## 2024-10-16 MED ORDER — SENNA 8.6 MG PO TABS
1.0000 | ORAL_TABLET | Freq: Two times a day (BID) | ORAL | Status: DC
  Administered 2024-10-16 – 2024-10-18 (×5): 8.6 mg via ORAL
  Filled 2024-10-16 (×5): qty 1

## 2024-10-16 MED ORDER — HYDRALAZINE HCL 20 MG/ML IJ SOLN: 10.0000 mg | Freq: Four times a day (QID) | INTRAMUSCULAR | Status: DC | PRN

## 2024-10-16 MED ORDER — PROPOFOL 10 MG/ML IV BOLUS
INTRAVENOUS | Status: DC | PRN
  Administered 2024-10-16: 50 mg via INTRAVENOUS
  Administered 2024-10-16: 30 mg via INTRAVENOUS
  Administered 2024-10-16: 50 mg via INTRAVENOUS

## 2024-10-16 MED ORDER — SODIUM CHLORIDE 0.9 % IV SOLN
500.0000 mg | INTRAVENOUS | Status: DC
  Filled 2024-10-16: qty 5

## 2024-10-16 MED ORDER — PHENYLEPHRINE HCL (PRESSORS) 10 MG/ML IV SOLN
INTRAVENOUS | Status: DC | PRN
Start: 2024-10-16 — End: 2024-10-16
  Administered 2024-10-16: 80 ug via INTRAVENOUS

## 2024-10-16 MED ORDER — PROPOFOL 500 MG/50ML IV EMUL
INTRAVENOUS | Status: DC | PRN
  Administered 2024-10-16: 150 ug/kg/min via INTRAVENOUS

## 2024-10-16 MED ORDER — GLYCOPYRROLATE PF 0.2 MG/ML IJ SOSY
PREFILLED_SYRINGE | INTRAMUSCULAR | Status: DC | PRN
  Administered 2024-10-16: .1 mg via INTRAVENOUS

## 2024-10-16 MED ORDER — PROMETHAZINE HCL 12.5 MG PO TABS
12.5000 mg | ORAL_TABLET | ORAL | Status: DC | PRN
  Filled 2024-10-16: qty 1

## 2024-10-16 MED ORDER — ACETAMINOPHEN 325 MG PO TABS: 650.0000 mg | ORAL_TABLET | Freq: Four times a day (QID) | ORAL | Status: DC | PRN

## 2024-10-16 MED ORDER — ACETAMINOPHEN 650 MG RE SUPP: 650.0000 mg | Freq: Four times a day (QID) | RECTAL | Status: DC | PRN

## 2024-10-16 MED ORDER — KCL IN DEXTROSE-NACL 20-5-0.9 MEQ/L-%-% IV SOLN
INTRAVENOUS | Status: DC
  Filled 2024-10-16 (×2): qty 1000

## 2024-10-16 MED ORDER — ONDANSETRON HCL 4 MG/2ML IJ SOLN: 4.0000 mg | Freq: Four times a day (QID) | INTRAMUSCULAR | Status: DC | PRN

## 2024-10-16 MED ORDER — SODIUM CHLORIDE 0.9 % IV SOLN: INTRAVENOUS | Status: DC

## 2024-10-16 MED ORDER — SUCRALFATE 1 GM/10ML PO SUSP
1.0000 g | Freq: Three times a day (TID) | ORAL | Status: DC
  Administered 2024-10-16 – 2024-10-18 (×6): 1 g via ORAL
  Filled 2024-10-16 (×6): qty 10

## 2024-10-16 NOTE — Op Note (Signed)
 [facility] Patient Name: Susan [name] Procedure Date : 10/16/2024 MRN: [id number] Attending MD: Sandor Flatter , MD, [id number] Date of Birth: 05-21-1971 CSN: [id number] Age: 53 Admit Type: Inpatient Procedure:                Upper GI endoscopy Indications:              Epigastric abdominal pain, Acute post hemorrhagic                            anemia, Melena Providers:                Sandor Flatter, MD, Collene Edu, RN, Lorrayne Kitty,                            Technician Referring MD:              Medicines:                Monitored Anesthesia Care Complications:            No immediate complications. Estimated Blood Loss:     Estimated blood loss was minimal. Procedure:                Pre-Anesthesia Assessment:                           - Prior to the procedure, a History and Physical                            was performed, and patient medications and                            allergies were reviewed. The patient's tolerance of                            previous anesthesia was also reviewed. The risks                            and benefits of the procedure and the sedation                            options and risks were discussed with the patient.                            All questions were answered, and informed consent                            was obtained. Prior Anticoagulants: The patient has                            taken no anticoagulant or antiplatelet agents. ASA                            Grade Assessment: III - A patient with severe  systemic disease. After reviewing the risks and                            benefits, the patient was deemed in satisfactory                            condition to undergo the procedure.                           After obtaining informed consent, the endoscope was                            passed under direct vision. Throughout the                            procedure, the patient's  blood pressure, pulse, and                            oxygen saturations were monitored continuously. The                            [id number]-H190 ([id number]) Olympus endoscope was introduced                            through the mouth, and advanced to the jejunum. The                            upper GI endoscopy was accomplished without                            difficulty. The patient tolerated the procedure                            well. Scope In: Scope Out: Findings:      The examined esophagus was normal.      Evidence of a Roux-en-Y gastrojejunostomy was found. The gastrojejunal       anastomosis was characterized by a marginal ulcer measuring 10 mm in       diameter with visible vessel. For hemostasis, three hemostatic clips       were successfully placed (MR conditional). Clip manufacturer: [employer]. Area was then injected with with 3 mL of a 0.1 mg/mL       solution of epinephrine  for hemostasis with appropriate mucosal       blanching. I then elected for further endoscopic treatment with 3 cc of       Purastat gel application. There was 1 retained surgical staple       visualized at the anastomosis. Given recent acute bleed with anemia, I       elected to not remove that today in favor of removal at the time of her       repeat upper endoscopy.      Moderate inflammation characterized by congestion (edema) and erythema       was found in the gastric fundus. Given recent active bleed with       high-grade stigmata of bleed, I elected  advanced mucosal biopsies in       favor of biopsying at the time of repeat upper endoscopy if ongoing       gastritis.      The examined jejunum was normal. Impression:               - Normal esophagus.                           - Roux-en-Y gastrojejunostomy with gastrojejunal                            anastomosis characterized by ulceration. Clips (MR                            conditional) were placed. Clip manufacturer: [employer]. Injected.                           - Moderate gastritis in the gastric pouch. This was                            intentionally not biopsied today.                           - Normal examined jejunum.                           - No specimens collected. Recommendation:           - Return patient to hospital ward for ongoing care.                           - Clear liquid diet today.                           - Use Protonix  (pantoprazole ) 40 mg IV BID. If no                            evidence of recurrent bleeding, transition to                            Protonix  40 mg p.o. twice daily x 8 weeks to                            promote mucosal healing, then 40 mg daily.                           - Use sucralfate  suspension 1 gram PO QID for 2                            weeks.                           - Repeat upper endoscopy in 8 weeks to check  healing.                           - Continue serial CBC checks with additional blood                            products as needed per protocol. Procedure Code(s):        --- Professional ---                           [phone], Esophagogastroduodenoscopy, flexible,                            transoral; with control of bleeding, any method Diagnosis Code(s):        --- Professional ---                           Z98.0, Intestinal bypass and anastomosis status                           K29.70, Gastritis, unspecified, without bleeding                           R10.13, Epigastric pain                           D62, Acute posthemorrhagic anemia                           K92.1, Melena (includes Hematochezia) CPT copyright 2022 [employer]. All rights reserved. The codes documented in this report are preliminary and upon coder review may  be revised to meet current compliance requirements. Sandor Flatter, MD 10/16/2024 3:44:38 PM Number of Addenda: 0

## 2024-10-16 NOTE — Anesthesia Postprocedure Evaluation (Signed)
 Anesthesia Post Note  Patient: Associate Professor  Procedure(s) Performed: EGD (ESOPHAGOGASTRODUODENOSCOPY) (Left)     Patient location during evaluation: PACU Anesthesia Type: MAC Level of consciousness: awake and alert Pain management: pain level controlled Vital Signs Assessment: post-procedure vital signs reviewed and stable Respiratory status: spontaneous breathing, nonlabored ventilation, respiratory function stable and patient connected to nasal cannula oxygen Cardiovascular status: stable and blood pressure returned to baseline Postop Assessment: no apparent nausea or vomiting Anesthetic complications: no   No notable events documented.  Last Vitals:  Vitals:   10/16/24 1615 10/16/24 1634  BP: 129/76 121/87  Pulse: 86 72  Resp: 12 10  Temp: 36.6 C (!) 36.4 C  SpO2: 100% 100%    Last Pain:  Vitals:   10/16/24 1634  TempSrc: Oral  PainSc:                  Thom JONELLE Peoples

## 2024-10-16 NOTE — Progress Notes (Signed)
 Pt has arrived to unit from [facility]. Vitals stable , tele applied and admissions notified . Dual skin assmt completed , see related flowsheet. General room orientation done , pt has no questions or concerns at this time . Call bell within reach with bed alarm set.   10/16/24 0136  Vitals  Temp 98.5 F (36.9 C)  Temp Source Oral  BP 125/73  MAP (mmHg) 89  BP Location Right Arm  BP Method Automatic  Patient Position (if appropriate) Lying  Pulse Rate 93  Pulse Rate Source Monitor  ECG Heart Rate 92  Resp 17  Level of Consciousness  Level of Consciousness Alert  MEWS COLOR  MEWS Score Color Green  Oxygen Therapy  SpO2 98 %  O2 Device Room Air  Pain Assessment  Pain Scale 0-10  Pain Score 0  Height and Weight  Weight 63.6 kg  BMI (Calculated) 25.64  MEWS Score  MEWS Temp 0  MEWS Systolic 0  MEWS Pulse 0  MEWS RR 0  MEWS LOC 0  MEWS Score 0

## 2024-10-16 NOTE — Interval H&P Note (Signed)
 History and Physical Interval Note:  10/16/2024 2:59 PM  Susan [name]  has presented today for surgery, with the diagnosis of Melena, epigastric pain, acute blood loss anemia.  The various methods of treatment have been discussed with the patient and family. After consideration of risks, benefits and other options for treatment, the patient has consented to  Procedure(s): EGD (ESOPHAGOGASTRODUODENOSCOPY) (Left) as a surgical intervention.  The patient's history has been reviewed, patient examined, no change in status, stable for surgery.  I have reviewed the patient's chart and labs.  Questions were answered to the patient's satisfaction.     Sandor GAILS Susan [name]

## 2024-10-16 NOTE — H&P (View-Only) (Signed)
 Consultation  Referring Provider:     Subrina Sandil, MD Primary Care Physician:  Jarold Medici, MD Primary Gastroenterologist:     Dr. Kristie Reason for Consultation:     Melena, acute blood loss anemia         HPI:   Susan [name] is a 53 y.o. female with a history of HTN, HLD, iron  deficiency anemia, uterine fibroids s/p embolization earlier this month, and history of PUD, presents with melena, midepigastric pain, nausea, and acute blood loss anemia.  She reports melenic stool started about 2 days ago.  No pain initially, but slowly developed mild epigastric gnawing discomfort.  Nausea started the following day, then episode of nonbloody emesis yesterday.  Similar symptoms back in 2016 when she was admitted with gastric ulcer, but no symptoms since then.  Does use BC powder 1-2 times per month for headaches.  Was previously taking omeprazole , but had stopped taking for a period of time.  Does have a history of uterine fibroids with iron  deficiency anemia.  Has had IV iron , including 3 infusions last month.  Underwent IR embolization on 10/01/2024.  She did come to the ER on 11/15 with lower abdominal pain.  At that time, CT with 6 x 6.9 x 5.8 cm uterine fibroid with features of prior embolization.  H/H was 12.7/39.2 with MCV/RDW 89/17, and was discharged home.  Prior labs from 07/2024 with H/H 10.3/33.4, ferritin 23, iron  21, TIBC 479, sat 4%.  Had received 3 IV iron  infusions since then.  Admission labs today with H/H 10.8/32.8 --> 8.4/25.3.  BUN/creatinine 18/0.68, normal liver enzymes.  Does have a history of gastric band placement.  Subsequently underwent band removal and gastric sleeve in 2018 here in [place].  She then underwent gastric bypass 3-4 years ago in [place].  Endoscopic History: - 09/2015: EGD (inpatient admission for melena): 1 cm gastric ulcer, LA Grade D erosive esophagitis, evidence of prior lap band - 06/2016: EGD: Healed gastric ulcer, mild gastritis, evidence of  prior lap band - 03/2020: Colonoscopy (Dr. Kristie): Normal colon, normal TI.  Repeat in 10 years  Past Medical History:  Diagnosis Date   Anemia 2016   with bleeding ulcer   Arthritis    oa hands   Constipation    Dizziness    GERD (gastroesophageal reflux disease)    no current meds   GI bleeding    Glaucoma    Headache    headaches and migraines   History of bleeding ulcers    History of stomach ulcers    Hypertension    Hypothyroidism    Insomnia    Iron  deficiency    Irregular periods    Osteoarthritis of both hands    PCOS (polycystic ovarian syndrome)    Pre-diabetes    Syncope    Vitamin D  deficiency     Past Surgical History:  Procedure Laterality Date   BILATERAL SALPINGECTOMY Bilateral 03/22/2013   Procedure: BILATERAL SALPINGECTOMY;  Surgeon: Dickie DELENA Carder, MD;  Location: [facility];  Service: Gynecology;  Laterality: Bilateral;   CARPAL TUNNEL RELEASE Right 11/15/2013   Procedure: RIGHT CARPAL TUNNEL RELEASE;  Surgeon: Arley JONELLE Curia, MD;  Location: [facility];  Service: Orthopedics;  Laterality: Right;   COSMETIC SURGERY  01/2015   tummy tuck    ESOPHAGOGASTRODUODENOSCOPY N/A 10/04/2015   Procedure: ESOPHAGOGASTRODUODENOSCOPY (EGD);  Surgeon: Elspeth Deward Naval, MD;  Location: [facility];  Service: Gastroenterology;  Laterality: N/A;   GANGLION CYST EXCISION Right 11/15/2013  Procedure: EXCISION CYST RIGHT WRIST;  Surgeon: Arley JONELLE Curia, MD;  Location: [facility];  Service: Orthopedics;  Laterality: Right;   IR EMBO TUMOR ORGAN ISCHEMIA INFARCT INC GUIDE ROADMAPPING  10/01/2024   IR RADIOLOGIST EVAL & MGMT  09/08/2024   LAPAROSCOPIC GASTRIC BAND REMOVAL WITH LAPAROSCOPIC GASTRIC SLEEVE RESECTION N/A 09/23/2017   Procedure: LAPAROSCOPIC GASTRIC BAND REMOVAL WITH LAPAROSCOPIC GASTRIC SLEEVE RESECTION;  Surgeon: Gladis Cough, MD;  Location: [facility];  Service: General;  Laterality: N/A;   LAPAROSCOPIC GASTRIC BANDING  2010    LAPAROSCOPY N/A 03/22/2013   Procedure: LAPAROSCOPY OPERATIVE;  Surgeon: Dickie DELENA Carder, MD;  Location: [facility];  Service: Gynecology;  Laterality: N/A;   MOUTH SURGERY  1991   REDUCTION MAMMAPLASTY Bilateral 2023   SALPINGECTOMY  03/22/2013    Family History  Problem Relation Age of Onset   Hypertension Mother    Cancer Mother        pancreatic   Stroke Mother    Diabetes Mother    Diabetes gravidarum Mother    High blood pressure Mother    Heart disease Mother    Obesity Mother    Hypertension Father    Ulcers Father    Diabetes Father    Thyroid  disease Father    CAD Maternal Grandmother    Kidney cancer Maternal Grandmother    Diabetes Paternal Grandmother    Heart attack Paternal Grandfather    Breast cancer Cousin        Maternal 1 st cousin   Hypertension Brother      Social History   Tobacco Use   Smoking status: Never   Smokeless tobacco: Never  Vaping Use   Vaping status: Never Used  Substance Use Topics   Alcohol use: Yes    Alcohol/week: 1.0 standard drink of alcohol    Types: 1 Standard drinks or equivalent per week    Comment: socially   Drug use: No    Prior to Admission medications   Medication Sig Start Date End Date Taking? Authorizing Provider  Cyanocobalamin  (B-12 PO) Take 1 tablet by mouth daily.   Yes [provider]  doxycycline  (VIBRAMYCIN ) 100 MG capsule Take 1 capsule (100 mg total) by mouth 2 (two) times daily. 10/09/24  Yes Dean Clarity, MD  Eszopiclone  3 MG TABS TAKE 1 TABLET BY MOUTH IMMEDIATELY BEFORE BEDTIME Patient taking differently: Take 3 mg by mouth at bedtime as needed (Sleep). 07/28/24  Yes Jarold Medici, MD  Evolocumab  (REPATHA  SURECLICK) 140 MG/ML SOAJ ADMINISTER 1 ML UNDER THE SKIN EVERY 14 DAYS 09/16/24  Yes Raford Riggs, MD  Iron -FA-B Cmp-C-Biot-Probiotic (FUSION PLUS) CAPS TAKE 1 CAPSULE BY MOUTH EVERY DAY Patient taking differently: Take 1 capsule by mouth every 7 (seven) days. 04/28/24  Yes  Jarold Medici, MD  Multiple Vitamin (MULTIVITAMIN PO) Take 1 tablet by mouth daily.   Yes [provider]  omeprazole  (PRILOSEC) 20 MG capsule Take 20 mg by mouth daily as needed (Indigestion).   Yes [provider]  oxyCODONE -acetaminophen  (PERCOCET/ROXICET) 5-325 MG tablet Take 1-2 tablets by mouth every 4 (four) hours as needed for severe pain (pain score 7-10).   Yes [provider]  promethazine  (PHENERGAN ) 12.5 MG tablet Take 1 tablet (12.5 mg total) by mouth every 4 (four) hours as needed for nausea or vomiting. 09/29/24  Yes Covington, Jamie R, NP  valsartan -hydrochlorothiazide  (DIOVAN  HCT) 160-12.5 MG tablet Take 1 tablet by mouth daily. Patient taking differently: Take 1 tablet by mouth at bedtime. 07/28/24  07/28/25 Yes Jarold Medici, MD    Current Facility-Administered Medications  Medication Dose Route Frequency Provider Last Rate Last Admin   acetaminophen  (TYLENOL ) tablet 650 mg  650 mg Oral Q6H PRN Danton Reyes DASEN, MD       Or   acetaminophen  (TYLENOL ) suppository 650 mg  650 mg Rectal Q6H PRN Danton Reyes DASEN, MD       dextrose  5 % and 0.9 % NaCl with KCl 20 mEq/L infusion   Intravenous Continuous Danton Reyes DASEN, MD       ondansetron  (ZOFRAN ) tablet 4 mg  4 mg Oral Q6H PRN Danton Reyes DASEN, MD       Or   ondansetron  (ZOFRAN ) injection 4 mg  4 mg Intravenous Q6H PRN Danton Reyes DASEN, MD       oxyCODONE -acetaminophen  (PERCOCET/ROXICET) 5-325 MG per tablet 1-2 tablet  1-2 tablet Oral Q4H PRN Danton Reyes DASEN, MD       pantoprazole  (PROTONIX ) injection 40 mg  40 mg Intravenous Q12H Sundil, Subrina, MD   40 mg at 10/15/24 2213   senna (SENOKOT) tablet 8.6 mg  1 tablet Oral BID Danton Reyes DASEN, MD        Allergies as of 10/15/2024 - Review Complete 10/15/2024  Allergen Reaction Noted   Crestor  [rosuvastatin ]  04/04/2020     Review of Systems:    As per HPI, otherwise negative    Physical Exam:  Vital signs in last 24 hours: Temp:   [98 F (36.7 C)-98.6 F (37 C)] 98.2 F (36.8 C) (11/22 0736) Pulse Rate:  [80-95] 95 (11/22 0736) Resp:  [16-19] 18 (11/22 0736) BP: (99-125)/(70-88) 115/77 (11/22 0736) SpO2:  [97 %-99 %] 99 % (11/22 0736) Weight:  [61.2 kg-63.6 kg] 63.6 kg (11/22 0136)   General:   Pleasant female in NAD Lungs:  Respirations even and unlabored. Lungs clear to auscultation bilaterally.   No wheezes, crackles, or rhonchi.  Heart:  Regular rate and rhythm; no MRG Abdomen:  Soft, nondistended, nontender. Normal bowel sounds. No appreciable masses or hepatomegaly.  Rectal:  Not performed.  Msk:  Symmetrical without gross deformities.  Extremities:  Without edema. Neurologic:  Alert and  oriented x4;  grossly normal neurologically. Skin:  Intact without significant lesions or rashes. Psych:  Alert and cooperative. Normal affect.  LAB RESULTS: Recent Labs    10/15/24 1241 10/16/24 0704  WBC 10.4 12.7*  HGB 10.8* 8.4*  HCT 32.8* 25.3*  PLT 575* 452*   BMET Recent Labs    10/15/24 1241 10/16/24 0704  NA 139 141  K 3.2* 3.7  CL 99 107  CO2 29 23  GLUCOSE 106* 90  BUN 17 18  CREATININE 0.69 0.68  CALCIUM  9.9 8.5*   LFT Recent Labs    10/15/24 1241  PROT 7.1  ALBUMIN 4.0  AST 36  ALT 40  ALKPHOS 84  BILITOT 0.3   PT/INR No results for input(s): LABPROT, INR in the last 72 hours.  STUDIES: No results found.     Impression / Plan:   1) Melena 2) Acute blood loss anemia 3) Epigastric pain 58) Nausea 53 year old female with history of lap band converted to gastric sleeve, then converted to gastric bypass in [place] 3-4 years ago, along with history of PUD in 2016, presents with 2 days of melenic stool, nausea, and acute blood loss anemia.  Discussed DDx with patient today, to include anastomotic ulcer, GU, AVM, etc. with plan for the following: - Protonix  40 mg IV twice  daily - N.p.o. - Plan for expedited EGD today for diagnostic and potentially therapeutic intent -  Serial CBC checks with blood products as needed per protocol - Additional recommendations pending endoscopic findings  5) Iron  deficiency anemia 6) Uterine fibroid History of IDA 2/2 large uterine fibroid with associated menorrhagia, now s/p IR embolization earlier this month.  No uterine bleeding since then.  Received IV iron  last month, so should not need more IV iron  during this admission.  The indications, risks, and benefits of EGD were explained to the patient in detail. Risks include but are not limited to bleeding, perforation, adverse reaction to medications, and cardiopulmonary compromise. Sequelae include but are not limited to the possibility of surgery, hospitalization, and mortality. The patient verbalized understanding and wished to proceed.   A total of 75 minutes of time was spent on this encounter, including in depth chart review, independent review of results as outlined above, communicating results with the patient directly, face-to-face time with the patient, coordinating care, and ordering studies and medications as appropriate, and documentation.   Sandor Flatter, DO, [facility]     LOS: 0 days   Sandor LULLA Flatter  10/16/2024, 9:59 AM

## 2024-10-16 NOTE — Anesthesia Preprocedure Evaluation (Addendum)
 Anesthesia Evaluation  Patient identified by MRN, date of birth, ID band Patient awake    Reviewed: Allergy & Precautions, H&P , NPO status , Patient's Chart, lab work & pertinent test results  History of Anesthesia Complications Negative for: history of anesthetic complications  Airway Mallampati: II  TM Distance: >3 FB Neck ROM: Full    Dental no notable dental hx.    Pulmonary neg pulmonary ROS   Pulmonary exam normal breath sounds clear to auscultation       Cardiovascular hypertension, Normal cardiovascular exam Rhythm:Regular Rate:Normal     Neuro/Psych  Headaches, neg Seizures PSYCHIATRIC DISORDERS Anxiety        GI/Hepatic Neg liver ROS,GERD  ,,GI bleed   Endo/Other  Hypothyroidism    Renal/GU negative Renal ROS  negative genitourinary   Musculoskeletal  (+) Arthritis ,    Abdominal   Peds negative pediatric ROS (+)  Hematology  (+) Blood dyscrasia, anemia Hg 8.6   Anesthesia Other Findings   Reproductive/Obstetrics negative OB ROS                              Anesthesia Physical Anesthesia Plan  ASA: 3  Anesthesia Plan: MAC   Post-op Pain Management: Minimal or no pain anticipated   Induction: Intravenous  PONV Risk Score and Plan: 2 and Propofol  infusion and Treatment may vary due to age or medical condition  Airway Management Planned: Natural Airway  Additional Equipment: None  Intra-op Plan:   Post-operative Plan:   Informed Consent: I have reviewed the patients History and Physical, chart, labs and discussed the procedure including the risks, benefits and alternatives for the proposed anesthesia with the patient or authorized representative who has indicated his/her understanding and acceptance.     Dental advisory given  Plan Discussed with: CRNA  Anesthesia Plan Comments:          Anesthesia Quick Evaluation

## 2024-10-16 NOTE — H&P (Signed)
 History and Physical    Susan [name] FMW:[id number] DOB: 29-Nov-1970 DOA: 10/15/2024  PCP: Jarold Medici, MD Patient coming from: Home  Chief Complaint: Dark stool   HPI: Susan [name] is a 53 y.o. female with medical history significant of peptic ulcer disease, iron  deficiency anemia, hypertension, and hyperlipidemia who presents to the hospital with complaint of dark stools which have been present for at least 2 days.. Patient states that initially she had one dark stool. She attempted to make outpatient appointment with gastroenterology for next week. However there was no availability. She then had 4 subsequent dark stools and became concerned and therefore presented to the ER for further evaluation and treatment.  Of note, she states that she was diagnosed with peptic ulcer disease in 2017 when she had a similar episode. She states that she had an EGD at that time. She also had colonoscopy about 5 years ago which was normal. She does have iron  deficiency anemia and reports that she is not on any oral supplements. She does receive Iron  transfusions when needed outpatient.  Patient had recent CT of abdomen on 10/09/2024 which demonstrated 6 x 06 0.9 x 5.8 cm uterine fibroid appears edematous has increased in size since prior MRI, likely as a result of recent embolization.  Multiple foci of a branching gas attenuation are seen centrally within the fibroid and are most likely related to embolization although infection should be excluded.  There is marked thinning of the overlying myometrium at the uterine fundus, more prominent than on prior study.  No evidence of extrauterine fluid or debris at this time but close follow-up is warranted.  Focal airspace consolidation in posterior right lobe is consistent with pneumonia.  ED Course:  In the ER, BP, HR, RR, O2 saturation,  and Tmax. Cbc demonstrated wbc 10.4,  hb/hct 10.8/32.8, and platelet 575. Chemistry demonstrated Na 139, K 3.2, Cl 99,  bicarb 29, Bun/Cr 17/0.69 and glucose 106. Patient was transferred for EGD and GI consultation.  Review of Systems:  All systems reviewed and apart from history of presenting illness, are negative.  Past Medical History:  Diagnosis Date   Anemia 2016   with bleeding ulcer   Arthritis    oa hands   Constipation    Dizziness    GERD (gastroesophageal reflux disease)    no current meds   GI bleeding    Glaucoma    Headache    headaches and migraines   History of bleeding ulcers    History of stomach ulcers    Hypertension    Hypothyroidism    Insomnia    Iron  deficiency    Irregular periods    Osteoarthritis of both hands    PCOS (polycystic ovarian syndrome)    Pre-diabetes    Syncope    Vitamin D  deficiency     Past Surgical History:  Procedure Laterality Date   BILATERAL SALPINGECTOMY Bilateral 03/22/2013   Procedure: BILATERAL SALPINGECTOMY;  Surgeon: Dickie DELENA Carder, MD;  Location: [facility];  Service: Gynecology;  Laterality: Bilateral;   CARPAL TUNNEL RELEASE Right 11/15/2013   Procedure: RIGHT CARPAL TUNNEL RELEASE;  Surgeon: Arley JONELLE Curia, MD;  Location: [facility];  Service: Orthopedics;  Laterality: Right;   COSMETIC SURGERY  01/2015   tummy tuck    ESOPHAGOGASTRODUODENOSCOPY N/A 10/04/2015   Procedure: ESOPHAGOGASTRODUODENOSCOPY (EGD);  Surgeon: Elspeth Deward Naval, MD;  Location: [facility];  Service: Gastroenterology;  Laterality: N/A;   GANGLION CYST EXCISION Right 11/15/2013   Procedure: EXCISION  CYST RIGHT WRIST;  Surgeon: Arley JONELLE Curia, MD;  Location: [facility];  Service: Orthopedics;  Laterality: Right;   IR EMBO TUMOR ORGAN ISCHEMIA INFARCT INC GUIDE ROADMAPPING  10/01/2024   IR RADIOLOGIST EVAL & MGMT  09/08/2024   LAPAROSCOPIC GASTRIC BAND REMOVAL WITH LAPAROSCOPIC GASTRIC SLEEVE RESECTION N/A 09/23/2017   Procedure: LAPAROSCOPIC GASTRIC BAND REMOVAL WITH LAPAROSCOPIC GASTRIC SLEEVE RESECTION;  Surgeon: Gladis Cough, MD;  Location: [facility];  Service: General;  Laterality: N/A;   LAPAROSCOPIC GASTRIC BANDING  2010   LAPAROSCOPY N/A 03/22/2013   Procedure: LAPAROSCOPY OPERATIVE;  Surgeon: Dickie DELENA Carder, MD;  Location: [facility];  Service: Gynecology;  Laterality: N/A;   MOUTH SURGERY  1991   REDUCTION MAMMAPLASTY Bilateral 2023   SALPINGECTOMY  03/22/2013     reports that she has never smoked. She has never used smokeless tobacco. She reports current alcohol use of about 1.0 standard drink of alcohol per week. She reports that she does not use drugs.  Allergies  Allergen Reactions   Crestor  [Rosuvastatin ]     Myalgias     Family History  Problem Relation Age of Onset   Hypertension Mother    Cancer Mother        pancreatic   Stroke Mother    Diabetes Mother    Diabetes gravidarum Mother    High blood pressure Mother    Heart disease Mother    Obesity Mother    Hypertension Father    Ulcers Father    Diabetes Father    Thyroid  disease Father    CAD Maternal Grandmother    Kidney cancer Maternal Grandmother    Diabetes Paternal Grandmother    Heart attack Paternal Grandfather    Breast cancer Cousin        Maternal 1 st cousin   Hypertension Brother     Prior to Admission medications   Medication Sig Start Date End Date Taking? Authorizing Provider  docusate sodium  (COLACE) 100 MG capsule Take 1 capsule (100 mg total) by mouth 2 (two) times daily. Take this medication while taking oxycodone  or as needed for constipation. 09/29/24   Covington, Jamie R, NP  doxycycline  (VIBRAMYCIN ) 100 MG capsule Take 1 capsule (100 mg total) by mouth 2 (two) times daily. 10/09/24   Dean Clarity, MD  Eszopiclone  3 MG TABS TAKE 1 TABLET BY MOUTH IMMEDIATELY BEFORE BEDTIME 07/28/24   Jarold Medici, MD  Evolocumab  (REPATHA  SURECLICK) 140 MG/ML SOAJ ADMINISTER 1 ML UNDER THE SKIN EVERY 14 DAYS 09/16/24   Raford Riggs, MD  Iron -FA-B Cmp-C-Biot-Probiotic (FUSION PLUS) CAPS TAKE 1 CAPSULE BY  MOUTH EVERY DAY 04/28/24   Jarold Medici, MD  omeprazole  (PRILOSEC) 20 MG capsule Take 20 mg by mouth daily.    [provider]  promethazine  (PHENERGAN ) 12.5 MG tablet Take 1 tablet (12.5 mg total) by mouth every 4 (four) hours as needed for nausea or vomiting. 09/29/24   Covington, Jamie R, NP  sulfamethoxazole -trimethoprim  (BACTRIM  DS) 800-160 MG tablet Take 1 tablet by mouth 2 (two) times daily for 10 days. 10/07/24 10/17/24  Covington, Jamie R, NP  valsartan -hydrochlorothiazide  (DIOVAN  HCT) 160-12.5 MG tablet Take 1 tablet by mouth daily. 07/28/24 07/28/25  Jarold Medici, MD    Physical Exam: Vitals:   10/15/24 2200 10/15/24 2326 10/16/24 0134 10/16/24 0136  BP: 105/76 105/83  125/73  Pulse: 86 80  93  Resp: 18   17  Temp: 98 F (36.7 C)   98.5 F (36.9 C)  TempSrc: Oral  Oral  SpO2: 99%   98%  Weight:   63.6 kg 63.6 kg  Height:   5' 2 (1.575 m)     Physical Exam Constitutional:      General: He is not in acute distress.    Appearance: Normal appearance.  HENT:     Head: Normocephalic and atraumatic.  Eyes:     Extraocular Movements: Extraocular movements intact.     Conjunctiva/sclera: Conjunctivae normal.     Pupils: Pupils are equal, round, and reactive to light.  Cardiovascular:     Rate and Rhythm: Normal rate and regular rhythm.     Pulses: Normal pulses.     Heart sounds: Normal heart sounds.  Pulmonary:     Effort: Pulmonary effort is normal. No respiratory distress.     Breath sounds: Normal breath sounds. No wheezing, rhonchi or rales.  Abdominal:     General: Abdomen is flat. Bowel sounds are normal. There is no distension.     Palpations: Abdomen is soft.     Tenderness: There is no abdominal tenderness.  Musculoskeletal:        General: No deformity. Normal range of motion.  Skin:    General: Skin is warm and dry.     Coloration: Skin is not jaundiced.  Neurological:     General: No focal deficit present.     Mental Status: He is alert and  oriented to person, place, and time. Mental status is at baseline.   Labs on Admission: I have personally reviewed following labs and imaging studies  CBC: Recent Labs  Lab 10/09/24 0842 10/15/24 1241  WBC 8.6 10.4  HGB 12.7 10.8*  HCT 39.2 32.8*  MCV 89.3 88.9  PLT 381 575*   Basic Metabolic Panel: Recent Labs  Lab 10/09/24 0842 10/15/24 1241  NA 138 139  K 3.4* 3.2*  CL 96* 99  CO2 32 29  GLUCOSE 99 106*  BUN 9 17  CREATININE 0.79 0.69  CALCIUM  9.9 9.9   GFR: Estimated Creatinine Clearance: 72.1 mL/min (by C-G formula based on SCr of 0.69 mg/dL). Liver Function Tests: Recent Labs  Lab 10/09/24 0842 10/15/24 1241  AST 24 36  ALT 16 40  ALKPHOS 94 84  BILITOT 0.3 0.3  PROT 7.7 7.1  ALBUMIN 3.9 4.0   Recent Labs  Lab 10/09/24 0842  LIPASE 27   No results for input(s): AMMONIA in the last 168 hours. Coagulation Profile: No results for input(s): INR, PROTIME in the last 168 hours. Cardiac Enzymes: No results for input(s): CKTOTAL, CKMB, CKMBINDEX, TROPONINI in the last 168 hours. BNP (last 3 results) No results for input(s): PROBNP in the last 8760 hours. HbA1C: No results for input(s): HGBA1C in the last 72 hours. CBG: No results for input(s): GLUCAP in the last 168 hours. Lipid Profile: No results for input(s): CHOL, HDL, LDLCALC, TRIG, CHOLHDL, LDLDIRECT in the last 72 hours. Thyroid  Function Tests: No results for input(s): TSH, T4TOTAL, FREET4, T3FREE, THYROIDAB in the last 72 hours. Anemia Panel: No results for input(s): VITAMINB12, FOLATE, FERRITIN, TIBC, IRON , RETICCTPCT in the last 72 hours. Urine analysis:    Component Value Date/Time   COLORURINE YELLOW 10/09/2024 0842   APPEARANCEUR CLEAR 10/09/2024 0842   LABSPEC 1.024 10/09/2024 0842   PHURINE 7.0 10/09/2024 0842   GLUCOSEU NEGATIVE 10/09/2024 0842   HGBUR TRACE (A) 10/09/2024 0842   BILIRUBINUR NEGATIVE 10/09/2024 0842    BILIRUBINUR NEGATIVE 01/22/2024 1055   KETONESUR NEGATIVE 10/09/2024 0842   PROTEINUR TRACE (A)  10/09/2024 0842   UROBILINOGEN 0.2 01/22/2024 1055   UROBILINOGEN 1.0 10/03/2015 1513   NITRITE NEGATIVE 10/09/2024 0842   LEUKOCYTESUR TRACE (A) 10/09/2024 0842    Radiological Exams on Admission: No results found.  EKG: Independently reviewed.   Assessment/Plan Principal Problem:   GI bleed   GI bleed Patient currently on IV protonix  at this time GI has been consulted to see the patient Occult blood    Hypertension Blood pressure well controlled Will hold off on any antihypertensive meds  Iron  deficiency anemia Hemoglobin is 10.8 Prior prior hemoglobin was 12.7 Will repeat another hemoglobin Transfuse for hemoglobin less than 7 or drop 2 g  DVT prophylaxis: scds  Code Status: full  Family Communication:   Menton,Marcele (Spouse) [phone] (Home Phone)     Disposition Plan: Status is: Inpatient Remains inpatient appropriate because: GI bleed   Consults called:gastroenterology Admission status: inpatient Level of care: Level of care: Progressive The medical decision making on this patient was of high complexity and the patient is at high risk for clinical deterioration, therefore this is a level 3 visit.  The medical decision making is of moderate complexity, therefore this is a level 2 visit.  Bradly MARLA Drones MD [facility]  If 7PM-7AM, please contact night-coverage [email or web address]  10/16/2024, 3:23 AM

## 2024-10-16 NOTE — Progress Notes (Signed)
   Susan [name]  FMW:[id number] DOB: [birth year] DOA: 10/15/2024 PCP: Jarold Medici, MD    Brief Narrative:  53 year old with a history of PUD diagnosed in 2017, chronic iron  deficiency anemia receiving intermittent outpatient IV iron  infusions, HTN, and HLD who presented to the ER 11/21 with 2+ days of dark stools.  She attempted to make an outpatient appointment with GI but there was no availability.  Hemoglobin at presentation was 10.8 compared to a recent baseline of 12.7.  The patient was hemodynamically stable.  Goals of Care:   Code Status: Prior   DVT prophylaxis: SCDs   Interim Hx: The patient was interviewed and examined by one of my partners earlier today.  Vitals have remained stable since that time with no new acute events reported.  Assessment & Plan:  Upper GI bleed with history of PUD Continue IV Protonix   Acute worsening of chronic anemia due to chronic iron  deficiency Hemoglobin 10.8 at presentation compared to a baseline of 12.7 10/09/24 - with volume expansion and time hemoglobin has decreased further to 8.4 -transfuse as needed if hemoglobin drops below 7.0  HTN Blood pressure well-controlled at present  Family Communication:  Disposition:     Objective: Blood pressure 115/77, pulse 95, temperature 98.2 F (36.8 C), temperature source Oral, resp. rate 18, height 5' 2 (1.575 m), weight 63.6 kg, SpO2 99%.  Intake/Output Summary (Last 24 hours) at 10/16/2024 0911 Last data filed at 10/16/2024 0516 Gross per 24 hour  Intake 2299.17 ml  Output --  Net 2299.17 ml   Filed Weights   10/15/24 1231 10/16/24 0134 10/16/24 0136  Weight: 61.2 kg 63.6 kg 63.6 kg    Examination: The patient was examined by one of my partners earlier today.  CBC: Recent Labs  Lab 10/15/24 1241 10/16/24 0704  WBC 10.4 12.7*  NEUTROABS  --  8.3*  HGB 10.8* 8.4*  HCT 32.8* 25.3*  MCV 88.9 88.2  PLT 575* 452*   Basic Metabolic Panel: Recent Labs  Lab 10/15/24 1241  10/16/24 0704  NA 139 141  K 3.2* 3.7  CL 99 107  CO2 29 23  GLUCOSE 106* 90  BUN 17 18  CREATININE 0.69 0.68  CALCIUM  9.9 8.5*   GFR: Estimated Creatinine Clearance: 72.1 mL/min (by C-G formula based on SCr of 0.68 mg/dL).   Scheduled Meds:  folic acid   1 mg Oral Daily   multivitamin with minerals  1 tablet Oral Daily   pantoprazole  (PROTONIX ) IV  40 mg Intravenous Q12H   thiamine   100 mg Oral Daily   Or   thiamine   100 mg Intravenous Daily   Continuous Infusions:  azithromycin      cefTRIAXone  (ROCEPHIN )  IV 2 g (10/16/24 0647)   lactated ringers  125 mL/hr at 10/16/24 0516     LOS: 0 days   Reyes IVAR Moores, MD [facility] Office  [phone] Pager - Text Page per Tracey  If 7PM-7AM, please contact night-coverage per Amion 10/16/2024, 9:11 AM

## 2024-10-16 NOTE — Consult Note (Addendum)
 Consultation  Referring Provider:     Subrina Sandil, MD Primary Care Physician:  Jarold Medici, MD Primary Gastroenterologist:     Dr. Kristie Reason for Consultation:     Melena, acute blood loss anemia         HPI:   Susan [name] is a 53 y.o. female with a history of HTN, HLD, iron  deficiency anemia, uterine fibroids s/p embolization earlier this month, and history of PUD, presents with melena, midepigastric pain, nausea, and acute blood loss anemia.  She reports melenic stool started about 2 days ago.  No pain initially, but slowly developed mild epigastric gnawing discomfort.  Nausea started the following day, then episode of nonbloody emesis yesterday.  Similar symptoms back in 2016 when she was admitted with gastric ulcer, but no symptoms since then.  Does use BC powder 1-2 times per month for headaches.  Was previously taking omeprazole , but had stopped taking for a period of time.  Does have a history of uterine fibroids with iron  deficiency anemia.  Has had IV iron , including 3 infusions last month.  Underwent IR embolization on 10/01/2024.  She did come to the ER on 11/15 with lower abdominal pain.  At that time, CT with 6 x 6.9 x 5.8 cm uterine fibroid with features of prior embolization.  H/H was 12.7/39.2 with MCV/RDW 89/17, and was discharged home.  Prior labs from 07/2024 with H/H 10.3/33.4, ferritin 23, iron  21, TIBC 479, sat 4%.  Had received 3 IV iron  infusions since then.  Admission labs today with H/H 10.8/32.8 --> 8.4/25.3.  BUN/creatinine 18/0.68, normal liver enzymes.  Does have a history of gastric band placement.  Subsequently underwent band removal and gastric sleeve in 2018 here in [place].  She then underwent gastric bypass 3-4 years ago in [place].  Endoscopic History: - 09/2015: EGD (inpatient admission for melena): 1 cm gastric ulcer, LA Grade D erosive esophagitis, evidence of prior lap band - 06/2016: EGD: Healed gastric ulcer, mild gastritis, evidence of  prior lap band - 03/2020: Colonoscopy (Dr. Kristie): Normal colon, normal TI.  Repeat in 10 years  Past Medical History:  Diagnosis Date   Anemia 2016   with bleeding ulcer   Arthritis    oa hands   Constipation    Dizziness    GERD (gastroesophageal reflux disease)    no current meds   GI bleeding    Glaucoma    Headache    headaches and migraines   History of bleeding ulcers    History of stomach ulcers    Hypertension    Hypothyroidism    Insomnia    Iron  deficiency    Irregular periods    Osteoarthritis of both hands    PCOS (polycystic ovarian syndrome)    Pre-diabetes    Syncope    Vitamin D  deficiency     Past Surgical History:  Procedure Laterality Date   BILATERAL SALPINGECTOMY Bilateral 03/22/2013   Procedure: BILATERAL SALPINGECTOMY;  Surgeon: Dickie DELENA Carder, MD;  Location: [facility];  Service: Gynecology;  Laterality: Bilateral;   CARPAL TUNNEL RELEASE Right 11/15/2013   Procedure: RIGHT CARPAL TUNNEL RELEASE;  Surgeon: Arley JONELLE Curia, MD;  Location: [facility];  Service: Orthopedics;  Laterality: Right;   COSMETIC SURGERY  01/2015   tummy tuck    ESOPHAGOGASTRODUODENOSCOPY N/A 10/04/2015   Procedure: ESOPHAGOGASTRODUODENOSCOPY (EGD);  Surgeon: Elspeth Deward Naval, MD;  Location: [facility];  Service: Gastroenterology;  Laterality: N/A;   GANGLION CYST EXCISION Right 11/15/2013  Procedure: EXCISION CYST RIGHT WRIST;  Surgeon: Arley JONELLE Curia, MD;  Location: [facility];  Service: Orthopedics;  Laterality: Right;   IR EMBO TUMOR ORGAN ISCHEMIA INFARCT INC GUIDE ROADMAPPING  10/01/2024   IR RADIOLOGIST EVAL & MGMT  09/08/2024   LAPAROSCOPIC GASTRIC BAND REMOVAL WITH LAPAROSCOPIC GASTRIC SLEEVE RESECTION N/A 09/23/2017   Procedure: LAPAROSCOPIC GASTRIC BAND REMOVAL WITH LAPAROSCOPIC GASTRIC SLEEVE RESECTION;  Surgeon: Gladis Cough, MD;  Location: [facility];  Service: General;  Laterality: N/A;   LAPAROSCOPIC GASTRIC BANDING  2010    LAPAROSCOPY N/A 03/22/2013   Procedure: LAPAROSCOPY OPERATIVE;  Surgeon: Dickie DELENA Carder, MD;  Location: [facility];  Service: Gynecology;  Laterality: N/A;   MOUTH SURGERY  1991   REDUCTION MAMMAPLASTY Bilateral 2023   SALPINGECTOMY  03/22/2013    Family History  Problem Relation Age of Onset   Hypertension Mother    Cancer Mother        pancreatic   Stroke Mother    Diabetes Mother    Diabetes gravidarum Mother    High blood pressure Mother    Heart disease Mother    Obesity Mother    Hypertension Father    Ulcers Father    Diabetes Father    Thyroid  disease Father    CAD Maternal Grandmother    Kidney cancer Maternal Grandmother    Diabetes Paternal Grandmother    Heart attack Paternal Grandfather    Breast cancer Cousin        Maternal 1 st cousin   Hypertension Brother      Social History   Tobacco Use   Smoking status: Never   Smokeless tobacco: Never  Vaping Use   Vaping status: Never Used  Substance Use Topics   Alcohol use: Yes    Alcohol/week: 1.0 standard drink of alcohol    Types: 1 Standard drinks or equivalent per week    Comment: socially   Drug use: No    Prior to Admission medications   Medication Sig Start Date End Date Taking? Authorizing Provider  Cyanocobalamin  (B-12 PO) Take 1 tablet by mouth daily.   Yes [provider]  doxycycline  (VIBRAMYCIN ) 100 MG capsule Take 1 capsule (100 mg total) by mouth 2 (two) times daily. 10/09/24  Yes Dean Clarity, MD  Eszopiclone  3 MG TABS TAKE 1 TABLET BY MOUTH IMMEDIATELY BEFORE BEDTIME Patient taking differently: Take 3 mg by mouth at bedtime as needed (Sleep). 07/28/24  Yes Jarold Medici, MD  Evolocumab  (REPATHA  SURECLICK) 140 MG/ML SOAJ ADMINISTER 1 ML UNDER THE SKIN EVERY 14 DAYS 09/16/24  Yes Raford Riggs, MD  Iron -FA-B Cmp-C-Biot-Probiotic (FUSION PLUS) CAPS TAKE 1 CAPSULE BY MOUTH EVERY DAY Patient taking differently: Take 1 capsule by mouth every 7 (seven) days. 04/28/24  Yes  Jarold Medici, MD  Multiple Vitamin (MULTIVITAMIN PO) Take 1 tablet by mouth daily.   Yes [provider]  omeprazole  (PRILOSEC) 20 MG capsule Take 20 mg by mouth daily as needed (Indigestion).   Yes [provider]  oxyCODONE -acetaminophen  (PERCOCET/ROXICET) 5-325 MG tablet Take 1-2 tablets by mouth every 4 (four) hours as needed for severe pain (pain score 7-10).   Yes [provider]  promethazine  (PHENERGAN ) 12.5 MG tablet Take 1 tablet (12.5 mg total) by mouth every 4 (four) hours as needed for nausea or vomiting. 09/29/24  Yes Covington, Jamie R, NP  valsartan -hydrochlorothiazide  (DIOVAN  HCT) 160-12.5 MG tablet Take 1 tablet by mouth daily. Patient taking differently: Take 1 tablet by mouth at bedtime. 07/28/24  07/28/25 Yes Jarold Medici, MD    Current Facility-Administered Medications  Medication Dose Route Frequency Provider Last Rate Last Admin   acetaminophen  (TYLENOL ) tablet 650 mg  650 mg Oral Q6H PRN Danton Reyes DASEN, MD       Or   acetaminophen  (TYLENOL ) suppository 650 mg  650 mg Rectal Q6H PRN Danton Reyes DASEN, MD       dextrose  5 % and 0.9 % NaCl with KCl 20 mEq/L infusion   Intravenous Continuous Danton Reyes DASEN, MD       ondansetron  (ZOFRAN ) tablet 4 mg  4 mg Oral Q6H PRN Danton Reyes DASEN, MD       Or   ondansetron  (ZOFRAN ) injection 4 mg  4 mg Intravenous Q6H PRN Danton Reyes DASEN, MD       oxyCODONE -acetaminophen  (PERCOCET/ROXICET) 5-325 MG per tablet 1-2 tablet  1-2 tablet Oral Q4H PRN Danton Reyes DASEN, MD       pantoprazole  (PROTONIX ) injection 40 mg  40 mg Intravenous Q12H Sundil, Subrina, MD   40 mg at 10/15/24 2213   senna (SENOKOT) tablet 8.6 mg  1 tablet Oral BID Danton Reyes DASEN, MD        Allergies as of 10/15/2024 - Review Complete 10/15/2024  Allergen Reaction Noted   Crestor  [rosuvastatin ]  04/04/2020     Review of Systems:    As per HPI, otherwise negative    Physical Exam:  Vital signs in last 24 hours: Temp:   [98 F (36.7 C)-98.6 F (37 C)] 98.2 F (36.8 C) (11/22 0736) Pulse Rate:  [80-95] 95 (11/22 0736) Resp:  [16-19] 18 (11/22 0736) BP: (99-125)/(70-88) 115/77 (11/22 0736) SpO2:  [97 %-99 %] 99 % (11/22 0736) Weight:  [61.2 kg-63.6 kg] 63.6 kg (11/22 0136)   General:   Pleasant female in NAD Lungs:  Respirations even and unlabored. Lungs clear to auscultation bilaterally.   No wheezes, crackles, or rhonchi.  Heart:  Regular rate and rhythm; no MRG Abdomen:  Soft, nondistended, nontender. Normal bowel sounds. No appreciable masses or hepatomegaly.  Rectal:  Not performed.  Msk:  Symmetrical without gross deformities.  Extremities:  Without edema. Neurologic:  Alert and  oriented x4;  grossly normal neurologically. Skin:  Intact without significant lesions or rashes. Psych:  Alert and cooperative. Normal affect.  LAB RESULTS: Recent Labs    10/15/24 1241 10/16/24 0704  WBC 10.4 12.7*  HGB 10.8* 8.4*  HCT 32.8* 25.3*  PLT 575* 452*   BMET Recent Labs    10/15/24 1241 10/16/24 0704  NA 139 141  K 3.2* 3.7  CL 99 107  CO2 29 23  GLUCOSE 106* 90  BUN 17 18  CREATININE 0.69 0.68  CALCIUM  9.9 8.5*   LFT Recent Labs    10/15/24 1241  PROT 7.1  ALBUMIN 4.0  AST 36  ALT 40  ALKPHOS 84  BILITOT 0.3   PT/INR No results for input(s): LABPROT, INR in the last 72 hours.  STUDIES: No results found.     Impression / Plan:   1) Melena 2) Acute blood loss anemia 3) Epigastric pain 58) Nausea 53 year old female with history of lap band converted to gastric sleeve, then converted to gastric bypass in [place] 3-4 years ago, along with history of PUD in 2016, presents with 2 days of melenic stool, nausea, and acute blood loss anemia.  Discussed DDx with patient today, to include anastomotic ulcer, GU, AVM, etc. with plan for the following: - Protonix  40 mg IV twice  daily - N.p.o. - Plan for expedited EGD today for diagnostic and potentially therapeutic intent -  Serial CBC checks with blood products as needed per protocol - Additional recommendations pending endoscopic findings  5) Iron  deficiency anemia 6) Uterine fibroid History of IDA 2/2 large uterine fibroid with associated menorrhagia, now s/p IR embolization earlier this month.  No uterine bleeding since then.  Received IV iron  last month, so should not need more IV iron  during this admission.  The indications, risks, and benefits of EGD were explained to the patient in detail. Risks include but are not limited to bleeding, perforation, adverse reaction to medications, and cardiopulmonary compromise. Sequelae include but are not limited to the possibility of surgery, hospitalization, and mortality. The patient verbalized understanding and wished to proceed.   A total of 75 minutes of time was spent on this encounter, including in depth chart review, independent review of results as outlined above, communicating results with the patient directly, face-to-face time with the patient, coordinating care, and ordering studies and medications as appropriate, and documentation.   Sandor Flatter, DO, [facility]     LOS: 0 days   Sandor LULLA Flatter  10/16/2024, 9:59 AM

## 2024-10-16 NOTE — Transfer of Care (Signed)
 Immediate Anesthesia Transfer of Care Note  Patient: Tracina Barkett  Procedure(s) Performed: EGD (ESOPHAGOGASTRODUODENOSCOPY) (Left)  Patient Location: PACU  Anesthesia Type:MAC  Level of Consciousness: drowsy  Airway & Oxygen Therapy: Patient Spontanous Breathing  Post-op Assessment: Report given to RN and Post -op Vital signs reviewed and stable  Post vital signs: Reviewed and stable  Last Vitals:  Vitals Value Taken Time  BP 101/54 10/16/24 15:35  Temp    Pulse 104 10/16/24 15:37  Resp 20 10/16/24 15:37  SpO2 100 % 10/16/24 15:37  Vitals shown include unfiled device data.  Last Pain:  Vitals:   10/16/24 1458  TempSrc: Temporal  PainSc: 0-No pain         Complications: No notable events documented.

## 2024-10-16 NOTE — Plan of Care (Signed)

## 2024-10-17 ENCOUNTER — Encounter (HOSPITAL_COMMUNITY): Payer: Self-pay | Admitting: Gastroenterology

## 2024-10-17 DIAGNOSIS — K922 Gastrointestinal hemorrhage, unspecified: Secondary | ICD-10-CM

## 2024-10-17 DIAGNOSIS — K289 Gastrojejunal ulcer, unspecified as acute or chronic, without hemorrhage or perforation: Secondary | ICD-10-CM

## 2024-10-17 DIAGNOSIS — D62 Acute posthemorrhagic anemia: Secondary | ICD-10-CM

## 2024-10-17 DIAGNOSIS — Z9884 Bariatric surgery status: Secondary | ICD-10-CM | POA: Diagnosis not present

## 2024-10-17 LAB — CBC
HCT: 22.8 % — ABNORMAL LOW (ref 36.0–46.0)
HCT: 24.1 % — ABNORMAL LOW (ref 36.0–46.0)
Hemoglobin: 7.3 g/dL — ABNORMAL LOW (ref 12.0–15.0)
Hemoglobin: 7.6 g/dL — ABNORMAL LOW (ref 12.0–15.0)
MCH: 29 pg (ref 26.0–34.0)
MCH: 29.1 pg (ref 26.0–34.0)
MCHC: 32 g/dL (ref 30.0–36.0)
MCHC: 31.5 g/dL (ref 30.0–36.0)
MCV: 90.5 fL (ref 80.0–100.0)
MCV: 92.3 fL (ref 80.0–100.0)
Platelets: 434 K/uL — ABNORMAL HIGH (ref 150–400)
Platelets: 431 K/uL — ABNORMAL HIGH (ref 150–400)
RBC: 2.52 MIL/uL — ABNORMAL LOW (ref 3.87–5.11)
RBC: 2.61 MIL/uL — ABNORMAL LOW (ref 3.87–5.11)
RDW: 17.4 % — ABNORMAL HIGH (ref 11.5–15.5)
RDW: 17.5 % — ABNORMAL HIGH (ref 11.5–15.5)
WBC: 10.3 K/uL (ref 4.0–10.5)
WBC: 8.7 K/uL (ref 4.0–10.5)
nRBC: 0 % (ref 0.0–0.2)
nRBC: 0 % (ref 0.0–0.2)

## 2024-10-17 LAB — COMPREHENSIVE METABOLIC PANEL WITH GFR
ALT: 42 U/L (ref 0–44)
AST: 47 U/L — ABNORMAL HIGH (ref 15–41)
Albumin: 2.4 g/dL — ABNORMAL LOW (ref 3.5–5.0)
Alkaline Phosphatase: 47 U/L (ref 38–126)
Anion gap: 8 (ref 5–15)
BUN: 9 mg/dL (ref 6–20)
CO2: 25 mmol/L (ref 22–32)
Calcium: 8.4 mg/dL — ABNORMAL LOW (ref 8.9–10.3)
Chloride: 109 mmol/L (ref 98–111)
Creatinine, Ser: 1 mg/dL (ref 0.44–1.00)
GFR, Estimated: >60 mL/min (ref >60)
Glucose, Bld: 126 mg/dL — ABNORMAL HIGH (ref 70–99)
Potassium: 3.8 mmol/L (ref 3.5–5.1)
Sodium: 142 mmol/L (ref 135–145)
Total Bilirubin: 0.5 mg/dL (ref 0.0–1.2)
Total Protein: 5.2 g/dL — ABNORMAL LOW (ref 6.5–8.1)

## 2024-10-17 LAB — ABO/RH: ABO/RH(D): A POS

## 2024-10-17 LAB — PROTIME-INR
INR: 1.1 (ref 0.8–1.2)
Prothrombin Time: 15.2 s (ref 11.4–15.2)

## 2024-10-17 LAB — PREPARE RBC (CROSSMATCH)

## 2024-10-17 LAB — APTT: aPTT: 31 s (ref 24–36)

## 2024-10-17 MED ORDER — ACETAMINOPHEN 325 MG PO TABS
650.0000 mg | ORAL_TABLET | Freq: Once | ORAL | Status: AC
  Administered 2024-10-17: 650 mg via ORAL
  Filled 2024-10-17: qty 2

## 2024-10-17 MED ORDER — SODIUM CHLORIDE 0.9% IV SOLUTION: Freq: Once | INTRAVENOUS | Status: AC

## 2024-10-17 NOTE — Progress Notes (Addendum)
 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  She attempted to make an outpatient appointment with GI but there was no availability.  Hemoglobin at presentation was 10.8 compared to a recent baseline of 12.7.  The patient was hemodynamically stable.  Goals of Care:   Code Status: Full Code   DVT prophylaxis: SCDs Start: 10/16/24 0922SCDs   Interim Hx: The patient underwent EGD yesterday which revealed a large marginal ulcer with a visible vessel which was clipped closed and injected with epinephrine  along with gel application.  Also appreciated with significant gastritis.  She is afebrile and hemodynamically stable.  Hemoglobin has trended downward overnight currently reaching a nadir of 7.3.  She is in good spirits and quite cheerful today.  She has no complaints.  She is anxious to be discharged home.  Assessment & Plan:  Upper GI bleed -large marginal ulcer with active bleeding Status post epi injection and gel application with clipping per EGD 11/22 - gastritis also appreciated - Protonix  40 mg twice daily x 8 weeks - will need relook EGD in 8 weeks  Acute worsening of chronic anemia due to blood loss on chronic iron  deficiency Hemoglobin 10.8 at presentation compared to a baseline of 12.7 10/09/24 - with volume expansion and time hemoglobin has decreased further to 8.4 and now 7.3 - transfusing an additional unit of PRBC today   HTN Blood pressure well-controlled at present  Family Communication: Spoke with  multiple family members at bedside Disposition: Anticipate discharge home if tolerating oral intake well and hemoglobin remains stable   Objective: Blood pressure 115/80, pulse 98, temperature 98 F (36.7 C), resp. rate 19, height 5' 2 (1.575 m), weight 63.6 kg, SpO2 100%. No intake or output data in the 24 hours ending 10/17/24 1536  Filed Weights   10/15/24 1231 10/16/24 0134 10/16/24 0136  Weight: 61.2 kg 63.6 kg 63.6 kg    Examination: General: No acute respiratory distress Lungs: Clear to auscultation bilaterally without wheezes or crackles Cardiovascular: Regular rate and rhythm without murmur gallop or rub normal S1 and S2 Abdomen: Nontender, nondistended, soft, bowel sounds positive Extremities: No significant edema bilateral lower extremities   CBC: Recent Labs  Lab 10/16/24 0704 10/16/24 1239 10/17/24 0210 10/17/24 1029  WBC 12.7* 14.0* 10.3 8.7  NEUTROABS 8.3*  --   --   --   HGB 8.4* 8.6* 7.3* 7.6*  HCT 25.3* 26.8* 22.8* 24.1*  MCV 88.2 89.0 90.5 92.3  PLT 452* 517* 434* 431*   Basic Metabolic Panel: Recent Labs  Lab 10/15/24 1241 10/16/24 0704 10/17/24 0210  NA 139 141 142  K 3.2* 3.7 3.8  CL 99 107 109  CO2 29 23 25   GLUCOSE 106* 90 126*  BUN 17 18 9   CREATININE 0.69 0.68 1.00  CALCIUM  9.9 8.5* 8.4*   GFR: Estimated Creatinine Clearance: 57.7 mL/min (by C-G formula based on SCr of 1 mg/dL).   Scheduled Meds:  pantoprazole  (PROTONIX ) IV  40 mg Intravenous Q12H   senna  1 tablet Oral BID   sucralfate   1 g Oral TID WC &  HS      LOS: 1 day   Reyes IVAR Moores, MD [facility] Office  [phone] Pager - Text Page per Amion  If 7PM-7AM, please contact night-coverage per Amion 10/17/2024, 3:36 PM

## 2024-10-17 NOTE — Progress Notes (Addendum)
 Patient ID: Susan [name], female   DOB: [birth year], 53 y.o.   MRN: [id number]    Progress Note   Subjective   Day # 2 CC; melena, acute blood loss anemia  EGD yesterday-ends of Roux-en-Y gastrojejunostomy, marginal ulcer measuring 10 mm present with visible vessel treated with 3 hemostatic clips, epi injection and Purostat gel  Hemoglobin 10.8 on admit>8.6 yesterday>7.3 today  Patient says she feels okay, little weak, she has not had any bowel movements or melena, no complaints of abdominal pain, tolerating clear liquids   Objective   Vital signs in last 24 hours: Temp:  [97.5 F (36.4 C)-98.4 F (36.9 C)] 98.3 F (36.8 C) (11/23 0800) Pulse Rate:  [72-105] 72 (11/23 0600) Resp:  [10-22] 12 (11/23 0800) BP: (101-129)/(54-91) 107/74 (11/23 0800) SpO2:  [97 %-100 %] 100 % (11/23 0800) Last BM Date : 10/16/24 General:    African-American in NAD, pleasant Heart:  Regular rate and rhythm; no murmurs Lungs: Respirations even and unlabored, lungs CTA bilaterally Abdomen:  Soft, nontender and nondistended. Normal bowel sounds.  Incisional port scars Extremities:  Without edema. Neurologic:  Alert and oriented,  grossly normal neurologically. Psych:  Cooperative. Normal mood and affect.  Intake/Output from previous day: 11/22 0701 - 11/23 0700 In: 450 [I.V.:450] Out: -  Intake/Output this shift: No intake/output data recorded.  Lab Results: Recent Labs    10/16/24 0704 10/16/24 1239 10/17/24 0210  WBC 12.7* 14.0* 10.3  HGB 8.4* 8.6* 7.3*  HCT 25.3* 26.8* 22.8*  PLT 452* 517* 434*   BMET Recent Labs    10/15/24 1241 10/16/24 0704 10/17/24 0210  NA 139 141 142  K 3.2* 3.7 3.8  CL 99 107 109  CO2 29 23 25   GLUCOSE 106* 90 126*  BUN 17 18 9   CREATININE 0.69 0.68 1.00  CALCIUM  9.9 8.5* 8.4*   LFT Recent Labs    10/17/24 0210  PROT 5.2*  ALBUMIN 2.4*  AST 47*  ALT 42  ALKPHOS 47  BILITOT 0.5   PT/INR Recent Labs    10/17/24 0210  LABPROT 15.2  INR  1.1         Assessment / Plan:    #84  53 year old female status post Roux-en-Y gastric bypass, with history of iron  deficiency anemia, and uterine fibroids for which she underwent embolization earlier this month and also previous history of peptic ulcer disease who presented to the emergency room with complaints of melena and epigastric pain with nausea.  Hemoglobin 10.8 on admission, had completed outpatient IV iron  infusions earlier this month.  EGD yesterday with finding of a marginal ulcer with visible vessel which was treated with Endo clipping, epinephrine  injection and purastat gel with good hemostasis.  Hemoglobin had drifted to 8.6 last night and is down to 7.3 today without any evidence of active bleeding.  Suspect this is equilibration  #2 recent surgery/GYN-had been taking BC powders  Plan; Advance to full liquid diet, then okay for soft diet in a.m. Continue IV PPI twice daily through tomorrow morning Will transfuse 1 unit of packed RBCs today Continue to trend hemoglobin  Stop all aspirin and NSAID use, patient aware Tylenol  okay On discharge she will need a prescription for omeprazole  40 mg p.o. twice daily, and instructed open the capsule and take with applesauce or yogurt twice daily before meals for better absorption in setting of previous Roux-en-Y gastric bypass.  Twice daily for 8 weeks, then once daily thereafter Carafate  suspension 1 g p.o. 3 times daily  between meals x 2 weeks total Hopefully should be able to be discharged home tomorrow morning. She is asking about working later this week which should be fine GI available as needed      Principal Problem:   GI bleed Active Problems:   Melena   Nausea without vomiting   History of gastric bypass   Acute on chronic anemia   Marginal ulcer   ABLA (acute blood loss anemia)     LOS: 1 day   Amy Esterwood PA-C 10/17/2024, 8:35 AM    Attending physician's note   I have taken a history, reviewed  the chart, and examined the patient. I performed a substantive portion of this encounter, including complete performance of at least one of the key components, in conjunction with the APP. I agree with the APP's note, impression, and recommendations with my edits.   EGD completed yesterday notable for 10 mm marginal ulcer with visible vessel, treated with hemostatic clips x 3, epinephrine , and Purastat gel application.  No overt bleeding overnight but H/H 7.3/22.8 today.  Transfusing 1 more unit now.  BUN appropriately declining 18--> 9.  Overall feeling well.  Multiple family members at bedside.  - Continue high-dose PPI.  Plan for opening omeprazole  capsules and taking with applesauce/soft foods as outlined above - Continue Carafate  x 2 weeks - Repeat CBC check in 7-10 days after hospital discharge.  Will arrange for that to be done in our office - Plan for repeat EGD as an outpatient in 6-8 weeks to evaluate for appropriate ulcer healing.  Will plan on removal of any retained/visible surgical staples along with gastric biopsies as appropriate at that time - Again counseled on NSAID avoidance and EtOH reduction - Inpatient GI service will sign off at this time.  Please do not hesitate to contact us  with additional questions or concerns  A total of 50 minutes of time was spent on this encounter, including in depth chart review, independent review of results as outlined above, communicating results with the patient directly, face-to-face time with the patient, coordinating care, and ordering studies and medications as appropriate, and documentation.   [address] DO, FACG [phone] office

## 2024-10-18 ENCOUNTER — Other Ambulatory Visit (HOSPITAL_COMMUNITY): Payer: Self-pay

## 2024-10-18 DIAGNOSIS — K922 Gastrointestinal hemorrhage, unspecified: Secondary | ICD-10-CM | POA: Diagnosis not present

## 2024-10-18 LAB — CBC
HCT: 29.5 % — ABNORMAL LOW (ref 36.0–46.0)
Hemoglobin: 9.8 g/dL — ABNORMAL LOW (ref 12.0–15.0)
MCH: 29.7 pg (ref 26.0–34.0)
MCHC: 33.2 g/dL (ref 30.0–36.0)
MCV: 89.4 fL (ref 80.0–100.0)
Platelets: 448 K/uL — ABNORMAL HIGH (ref 150–400)
RBC: 3.3 MIL/uL — ABNORMAL LOW (ref 3.87–5.11)
RDW: 16.9 % — ABNORMAL HIGH (ref 11.5–15.5)
WBC: 8.7 K/uL (ref 4.0–10.5)
nRBC: 0 % (ref 0.0–0.2)

## 2024-10-18 LAB — TYPE AND SCREEN
ABO/RH(D): A POS
Antibody Screen: NEGATIVE
Unit division: 0

## 2024-10-18 LAB — BPAM RBC
Blood Product Expiration Date: 202512182359
ISSUE DATE / TIME: 202511231307
Unit Type and Rh: 6200

## 2024-10-18 MED ORDER — OMEPRAZOLE 40 MG PO CPDR
40.0000 mg | DELAYED_RELEASE_CAPSULE | Freq: Two times a day (BID) | ORAL | 0 refills | Status: DC
  Filled 2024-10-18: qty 112, 56d supply, fill #0

## 2024-10-18 MED ORDER — SUCRALFATE 1 G PO TABS
1.0000 g | ORAL_TABLET | Freq: Three times a day (TID) | ORAL | 0 refills | Status: DC
  Filled 2024-10-18: qty 56, 14d supply, fill #0

## 2024-10-18 MED ORDER — PANTOPRAZOLE SODIUM 40 MG PO TBEC
40.0000 mg | DELAYED_RELEASE_TABLET | Freq: Two times a day (BID) | ORAL | Status: DC
  Administered 2024-10-18: 40 mg via ORAL
  Filled 2024-10-18: qty 1

## 2024-10-18 MED ORDER — ACETAMINOPHEN 325 MG PO TABS: 650.0000 mg | ORAL_TABLET | Freq: Four times a day (QID) | ORAL | Status: AC | PRN

## 2024-10-18 MED ORDER — OMEPRAZOLE 40 MG PO CPDR
40.0000 mg | DELAYED_RELEASE_CAPSULE | Freq: Two times a day (BID) | ORAL | 0 refills | Status: AC
  Filled 2024-10-18: qty 60, 30d supply, fill #0

## 2024-10-18 NOTE — Discharge Summary (Signed)
 DISCHARGE SUMMARY  [name]  MR#: [id number]  DOB:[birth year]  Date of Admission: 10/15/2024 Date of Discharge: 10/18/2024  Attending Physician:Cregg Jutte ONEIDA Moores, MD  Patient's ERE:Susan [name], [name], MD  Disposition: D/C home   Follow-up Appts:  Follow-up Information     [facility] .   Specialty: Gastroenterology Why: The office will call you to arrange for a follow up check of your Hgb (blood count) Contact information: [address] 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  She attempted to make an outpatient appointment with GI but there was no availability.  Hemoglobin at presentation was 10.8 compared to a recent baseline of 12.7.  The patient was hemodynamically stable.   Hospital Course:  Upper GI bleed - large marginal ulcer with active bleeding Status post epi injection and gel application with clipping per EGD 11/22 - gastritis also appreciated -instructed to utilize omeprazole  40 mg p.o. twice daily for 8 weeks with opening of capsule and taking contents with applesauce or yogurt to improve absorption in setting of gastric  bypass with transition to daily dosing after 8 weeks -Carafate  suspension to be utilized 1 g p.o. 3 times daily between meals x 2 weeks - will need relook EGD in 6-8 weeks which will be arranged by [facility] -the patient is advised to avoid all NSAIDs and greatly minimize alcohol intake   Acute worsening of chronic anemia due to blood loss on chronic iron  deficiency Hemoglobin 10.8 at presentation compared to a baseline of 12.7 10/09/24 - with volume expansion and time hemoglobin decreased further to a nadir of 7.3 -has been transfused 1 unit PRBC during this admission with good response   HTN Blood pressure well-controlled during this admission  Allergies as of 10/18/2024       Reactions   Crestor  [rosuvastatin ]    Myalgias         Medication List     STOP taking these medications    doxycycline  100 MG capsule Commonly known as: VIBRAMYCIN        TAKE these medications    acetaminophen  325 MG tablet Commonly known as: TYLENOL  Take 2 tablets (650 mg total) by mouth every 6 (six) hours as needed for mild pain (pain score 1-3) or fever (or Fever >/= 101).   B-12 PO Take 1 tablet by mouth daily.   Eszopiclone  3 MG Tabs TAKE 1 TABLET BY MOUTH IMMEDIATELY BEFORE BEDTIME What changed:  how much to take how to take this when to take this reasons to take this additional instructions   Fusion Plus Caps TAKE 1 CAPSULE BY MOUTH EVERY DAY What changed: when to take this   MULTIVITAMIN PO Take 1 tablet  by mouth daily.   omeprazole  40 MG capsule Commonly known as: PRILOSEC Take 1 capsule (40 mg total) by mouth 2 (two) times daily with a meal. Open capsule and place contents in applesauce or yogurt for consumption. What changed:  medication strength how much to take when to take this reasons to take this additional instructions   oxyCODONE -acetaminophen  5-325 MG tablet Commonly known as: PERCOCET/ROXICET Take 1-2 tablets by mouth every 4 (four) hours as needed for  severe pain (pain score 7-10).   promethazine  12.5 MG tablet Commonly known as: PHENERGAN  Take 1 tablet (12.5 mg total) by mouth every 4 (four) hours as needed for nausea or vomiting.   Repatha  SureClick 140 MG/ML Soaj Generic drug: Evolocumab  ADMINISTER 1 ML UNDER THE SKIN EVERY 14 DAYS   sucralfate  1 g tablet Commonly known as: CARAFATE  Take 1 tablet (1 g total) by mouth 4 (four) times daily -  with meals and at bedtime for 14 days.   valsartan -hydrochlorothiazide  160-12.5 MG tablet Commonly known as: Diovan  HCT Take 1 tablet by mouth daily. What changed: when to take this        Day of Discharge BP 126/82 (BP Location: Right Arm)   Pulse 72   Temp 98.7 F (37.1 C) (Oral)   Resp 11   Ht 5' 2 (1.575 m)   Wt 63.6 kg   LMP  (LMP Unknown)   SpO2 99%   BMI 25.65 kg/m   Physical Exam: General: No acute respiratory distress Lungs: Clear to auscultation bilaterally without wheezes or crackles Cardiovascular: Regular rate and rhythm without murmur gallop or rub normal S1 and S2 Abdomen: Nontender, nondistended, soft, bowel sounds positive, no rebound, no ascites, no appreciable mass Extremities: No significant cyanosis, clubbing, or edema bilateral lower extremities  Basic Metabolic Panel: Recent Labs  Lab 10/15/24 1241 10/16/24 0704 10/17/24 0210  NA 139 141 142  K 3.2* 3.7 3.8  CL 99 107 109  CO2 29 23 25   GLUCOSE 106* 90 126*  BUN 17 18 9   CREATININE 0.69 0.68 1.00  CALCIUM  9.9 8.5* 8.4*    CBC: Recent Labs  Lab 10/16/24 0704 10/16/24 1239 10/17/24 0210 10/17/24 1029 10/18/24 0254  WBC 12.7* 14.0* 10.3 8.7 8.7  NEUTROABS 8.3*  --   --   --   --   HGB 8.4* 8.6* 7.3* 7.6* 9.8*  HCT 25.3* 26.8* 22.8* 24.1* 29.5*  MCV 88.2 89.0 90.5 92.3 89.4  PLT 452* 517* 434* 431* 448*    Time spent in discharge (includes decision making & examination of pt): 35 minutes  10/18/2024, 11:29 AM   Reyes IVAR Moores, MD [facility] Office   [phone]

## 2024-10-18 NOTE — Progress Notes (Signed)
 Reviewed AVS, patient expressed understanding of medications, MD follow up reviewed.   Removed IV, Site clean, dry and intact.  Patient states all belongings brought to the hospital at time of admission are accounted for and packed to take home.  Picked up medications from [facility] pharmacy. [facility] contacted to transport patient to entrance C,  where family member was waiting in vehicle to transport home.

## 2024-10-19 ENCOUNTER — Telehealth: Payer: Self-pay

## 2024-10-19 DIAGNOSIS — K921 Melena: Secondary | ICD-10-CM

## 2024-10-19 DIAGNOSIS — K922 Gastrointestinal hemorrhage, unspecified: Secondary | ICD-10-CM

## 2024-10-19 NOTE — Telephone Encounter (Signed)
-----   Message from Sandor LULLA Flatter sent at 10/17/2024  3:27 PM EST ----- Anticipate this patient will be discharged over the next day or so.  Continue you please coordinate for the following: - CBC check 7-10 days after hospital discharge - EGD with me in the [facility] in 6-8 weeks  Thanks.

## 2024-10-19 NOTE — Telephone Encounter (Signed)
 Spoke with patient by phone and she is aware that she needs to have lab work completed next week.  Patient advised that she is scheduled for an EGD with Dr San on 12-13-24 at 12:30pm in the [facility]  Patient instructions sent to patient via MyChart.  Patient agreed to plan and verbalized understanding.  No further questions.

## 2024-10-26 ENCOUNTER — Ambulatory Visit: Payer: Self-pay | Admitting: Internal Medicine

## 2024-10-26 ENCOUNTER — Encounter: Payer: Self-pay | Admitting: Internal Medicine

## 2024-10-26 VITALS — BP 118/78 | HR 85 | Temp 98.3°F | Ht 62.0 in | Wt 143.8 lb

## 2024-10-26 DIAGNOSIS — D649 Anemia, unspecified: Secondary | ICD-10-CM | POA: Diagnosis not present

## 2024-10-26 DIAGNOSIS — Z9884 Bariatric surgery status: Secondary | ICD-10-CM | POA: Diagnosis not present

## 2024-10-26 DIAGNOSIS — K289 Gastrojejunal ulcer, unspecified as acute or chronic, without hemorrhage or perforation: Secondary | ICD-10-CM

## 2024-10-26 DIAGNOSIS — D62 Acute posthemorrhagic anemia: Secondary | ICD-10-CM

## 2024-10-26 DIAGNOSIS — I10 Essential (primary) hypertension: Secondary | ICD-10-CM

## 2024-10-26 DIAGNOSIS — K922 Gastrointestinal hemorrhage, unspecified: Secondary | ICD-10-CM | POA: Diagnosis not present

## 2024-10-26 NOTE — Assessment & Plan Note (Signed)
 TCM PERFORMED. A MEMBER OF THE CLINICAL TEAM SPOKE WITH THE PATIENT UPON DISCHARGE. DISCHARGE SUMMARY WAS REVIEWED IN FULL DETAIL DURING THE VISIT. MEDS RECONCILED AND COMPARED TO DISCHARGE MEDS. MEDICATION LIST WAS UPDATED AND REVIEWED WITH THE PATIENT. GREATER THAN 50% FACE TO FACE TIME WAS SPENT IN COUNSELING AND COORDINATION OF CARE. ALL QUESTIONS WERE ANSWERED TO THE SATISFACTION OF THE PATIENT.

## 2024-10-26 NOTE — Progress Notes (Unsigned)
 I,Victoria T Emmitt, CMA,acting as a neurosurgeon for [name] LOISE Slocumb, MD.,have documented all relevant documentation on the behalf of [name] LOISE Slocumb, MD,as directed by  [name] LOISE Slocumb, MD while in the presence of [name] LOISE Slocumb, MD.  Subjective:  Patient ID: Susan [name] , female    DOB: 01-31-1971 , 53 y.o.   MRN: [id number]  Chief Complaint  Patient presents with   Hospitalization Follow-up    Patient presents today for hospital follow up. Admitted 11/21 & [place] 11[place]24, [place]. For Upper GI bleed. She wants to know how long should she wait to get pneumonia vaccine.     HPI Discussed the use of AI scribe software for clinical note transcription with the patient, who gave verbal consent to proceed.  History of Present Illness Susan [name] is a 53 year old female with a history of upper GI bleed and chronic anemia who presents for a hospital follow-up.  She was admitted to the hospital from November 21st to November 24th due to an upper GI bleed caused by a large ulcer with active bleeding, leading to an acute exacerbation of her chronic anemia. Prior to her emergency room visit, she experienced two days of melena, starting on a Thursday, prompting her to seek medical attention the following day.  During her hospital stay, an EGD was performed on November 22nd, where she received an epinephrine  injection and clipping of the ulcer. She was discharged on omeprazole  40 mg twice daily, which she continues to take by opening the capsule to aid absorption. She is also on Carafate , prescribed four times daily for two weeks, although she initially thought it was three times daily. Her hemoglobin levels were managed during her hospital stay, initially rising to 12.7 g/dL, then dropping to 7 g/dL, and subsequently increasing to 9 g/dL after receiving a blood transfusion on the Sunday before discharge. No further melena has been noted, and her stool color has returned to normal.  She is  scheduled for follow-up appointments with her GI doctor on January 19th and has a routine pre-blood work appointment on December 17th, which may be adjusted based on her current lab results.     Hypertension This is a chronic problem. The current episode started more than 1 year ago. The problem has been gradually improving since onset. The problem is controlled. Pertinent negatives include no blurred vision, chest pain or palpitations. Risk factors for coronary artery disease include obesity and stress. Past treatments include angiotensin blockers and diuretics. The current treatment provides moderate improvement.     Past Medical History:  Diagnosis Date   Anemia 2016   with bleeding ulcer   Arthritis    oa hands   Constipation    Dizziness    GERD (gastroesophageal reflux disease)    no current meds   GI bleeding    Glaucoma    Headache    headaches and migraines   History of bleeding ulcers    History of stomach ulcers    Hypertension    Hypothyroidism    Insomnia    Iron  deficiency    Irregular periods    Osteoarthritis of both hands    PCOS (polycystic ovarian syndrome)    Pre-diabetes    Syncope    Vitamin D  deficiency      Family History  Problem Relation Age of Onset   Hypertension Mother    Cancer Mother        pancreatic   Stroke Mother  Diabetes Mother    Diabetes gravidarum Mother    High blood pressure Mother    Heart disease Mother    Obesity Mother    Hypertension Father    Ulcers Father    Diabetes Father    Thyroid  disease Father    CAD Maternal Grandmother    Kidney cancer Maternal Grandmother    Diabetes Paternal Grandmother    Heart attack Paternal Grandfather    Breast cancer Cousin        Maternal 1 st cousin   Hypertension Brother      Current Outpatient Medications:    acetaminophen  (TYLENOL ) 325 MG tablet, Take 2 tablets (650 mg total) by mouth every 6 (six) hours as needed for mild pain (pain score 1-3) or fever (or Fever >/=  101)., Disp: , Rfl:    Cyanocobalamin  (B-12 PO), Take 1 tablet by mouth daily., Disp: , Rfl:    Eszopiclone  3 MG TABS, TAKE 1 TABLET BY MOUTH IMMEDIATELY BEFORE BEDTIME (Patient taking differently: Take 3 mg by mouth at bedtime as needed (Sleep).), Disp: 30 tablet, Rfl: 1   Evolocumab  (REPATHA  SURECLICK) 140 MG/ML SOAJ, ADMINISTER 1 ML UNDER THE SKIN EVERY 14 DAYS, Disp: 2 mL, Rfl: 2   Iron -FA-B Cmp-C-Biot-Probiotic (FUSION PLUS) CAPS, TAKE 1 CAPSULE BY MOUTH EVERY DAY (Patient taking differently: Take 1 capsule by mouth every 7 (seven) days.), Disp: 30 capsule, Rfl: 4   Multiple Vitamin (MULTIVITAMIN PO), Take 1 tablet by mouth daily., Disp: , Rfl:    omeprazole  (PRILOSEC) 40 MG capsule, Take 1 capsule (40 mg total) by mouth 2 (two) times daily with a meal. Open capsule and place contents in applesauce or yogurt for consumption., Disp: 112 capsule, Rfl: 0   oxyCODONE -acetaminophen  (PERCOCET/ROXICET) 5-325 MG tablet, Take 1-2 tablets by mouth every 4 (four) hours as needed for severe pain (pain score 7-10)., Disp: , Rfl:    promethazine  (PHENERGAN ) 12.5 MG tablet, Take 1 tablet (12.5 mg total) by mouth every 4 (four) hours as needed for nausea or vomiting., Disp: 30 tablet, Rfl: 0   sucralfate  (CARAFATE ) 1 g tablet, Take 1 tablet (1 g total) by mouth 4 (four) times daily -  with meals and at bedtime for 14 days., Disp: 56 tablet, Rfl: 0   valsartan -hydrochlorothiazide  (DIOVAN  HCT) 160-12.5 MG tablet, Take 1 tablet by mouth daily. (Patient taking differently: Take 1 tablet by mouth at bedtime.), Disp: 90 tablet, Rfl: 2   Allergies  Allergen Reactions   Crestor  [Rosuvastatin ]     Myalgias      Review of Systems  Constitutional: Negative.   Eyes:  Negative for blurred vision.  Respiratory: Negative.    Cardiovascular: Negative.  Negative for chest pain and palpitations.  Gastrointestinal: Negative.   Neurological: Negative.   Psychiatric/Behavioral: Negative.       Today's Vitals   10/26/24  1616  BP: 118/78  Pulse: 85  Temp: 98.3 F (36.8 C)  SpO2: 98%  Weight: 143 lb 12.8 oz (65.2 kg)  Height: 5' 2 (1.575 m)   Body mass index is 26.3 kg/m.  Wt Readings from Last 3 Encounters:  10/26/24 143 lb 12.8 oz (65.2 kg)  10/16/24 140 lb 3.4 oz (63.6 kg)  07/28/24 141 lb 6.4 oz (64.1 kg)    The 10-year ASCVD risk score (Arnett DK, et al., 2019) is: 2.6%   Values used to calculate the score:     Age: 50 years     Clinically relevant sex: Female     Is  Non-Hispanic African American: Yes     Diabetic: No     Tobacco smoker: No     Systolic Blood Pressure: 118 mmHg     Is BP treated: Yes     HDL Cholesterol: 60 mg/dL     Total Cholesterol: 198 mg/dL  Objective:  Physical Exam Vitals and nursing note reviewed.  Constitutional:      Appearance: Normal appearance.  HENT:     Head: Normocephalic and atraumatic.  Eyes:     Extraocular Movements: Extraocular movements intact.  Cardiovascular:     Rate and Rhythm: Normal rate and regular rhythm.     Heart sounds: Normal heart sounds.  Pulmonary:     Effort: Pulmonary effort is normal.     Breath sounds: Normal breath sounds.  Musculoskeletal:     Cervical back: Normal range of motion.  Skin:    General: Skin is warm.  Neurological:     General: No focal deficit present.     Mental Status: She is alert.  Psychiatric:        Mood and Affect: Mood normal.        Behavior: Behavior normal.         Assessment And Plan:   Assessment & Plan Upper GI bleed TCM PERFORMED. A MEMBER OF THE CLINICAL TEAM SPOKE WITH THE PATIENT UPON DISCHARGE. DISCHARGE SUMMARY WAS REVIEWED IN FULL DETAIL DURING THE VISIT. MEDS RECONCILED AND COMPARED TO DISCHARGE MEDS. MEDICATION LIST WAS UPDATED AND REVIEWED WITH THE PATIENT. GREATER THAN 50% FACE TO FACE TIME WAS SPENT IN COUNSELING AND COORDINATION OF CARE. ALL QUESTIONS WERE ANSWERED TO THE SATISFACTION OF THE PATIENT. Recent hospitalization for upper GI bleed with large ulcer treated  with EGD, epinephrine  injection, and clipping. Currently on omeprazole  and Carafate  with symptom improvement. - Continue omeprazole  40 mg twice daily until GI follow-up. - Continue Carafate  four times daily for two weeks. - Follow up with GI on January 19th for endoscopy.  Marginal ulcer Recent hospitalization for upper GI bleed with large ulcer treated with EGD, epinephrine  injection, and clipping. Currently on omeprazole  and Carafate  with symptom improvement. - Continue omeprazole  40 mg twice daily until GI follow-up. - Continue Carafate  four times daily for two weeks. - Follow up with GI on January 19th for endoscopy. Acute on chronic anemia Chronic anemia worsened due to recent GI bleed. Hemoglobin improved post-transfusion. - Coordinate with GI for blood work to monitor hemoglobin levels. - Discuss with GI regarding the need for iron  studies. - Keep f/u appt with Hematology. Essential hypertension Blood pressure well-controlled during hospitalization, typically fluctuates during bleeding episodes. - Follow low sodium diet.  History of gastric bypass   Orders Placed This Encounter  Procedures   Iron , TIBC and Ferritin Panel   CBC no Diff     Return if symptoms worsen or fail to improve.  Patient was given opportunity to ask questions. Patient verbalized understanding of the plan and was able to repeat key elements of the plan. All questions were answered to their satisfaction.    I, [name] LOISE Slocumb, MD, have reviewed all documentation for this visit. The documentation on 10/26/24 for the exam, diagnosis, procedures, and orders are all accurate and complete.   IF YOU HAVE BEEN REFERRED TO A SPECIALIST, IT MAY TAKE 1-2 WEEKS TO SCHEDULE/PROCESS THE REFERRAL. IF YOU HAVE NOT HEARD FROM US /SPECIALIST IN TWO WEEKS, PLEASE GIVE US  A CALL AT [phone].

## 2024-10-27 ENCOUNTER — Ambulatory Visit: Payer: Self-pay | Admitting: Hematology and Oncology

## 2024-10-27 LAB — CBC
Hematocrit: 32.5 % — ABNORMAL LOW (ref 34.0–46.6)
Hemoglobin: 10.5 g/dL — ABNORMAL LOW (ref 11.1–15.9)
MCH: 29.5 pg (ref 26.6–33.0)
MCHC: 32.3 g/dL (ref 31.5–35.7)
MCV: 91 fL (ref 79–97)
Platelets: 564 x10E3/uL — ABNORMAL HIGH (ref 150–450)
RBC: 3.56 x10E6/uL — ABNORMAL LOW (ref 3.77–5.28)
RDW: 16.2 % — ABNORMAL HIGH (ref 11.7–15.4)
WBC: 7.6 x10E3/uL (ref 3.4–10.8)

## 2024-10-27 LAB — IRON,TIBC AND FERRITIN PANEL
Ferritin: 69 ng/mL (ref 15–150)
Iron Saturation: 8 % — CL (ref 15–55)
Iron: 27 ug/dL (ref 27–159)
Total Iron Binding Capacity: 344 ug/dL (ref 250–450)
UIBC: 317 ug/dL (ref 131–425)

## 2024-11-03 ENCOUNTER — Other Ambulatory Visit: Payer: Self-pay | Admitting: Interventional Radiology

## 2024-11-03 DIAGNOSIS — D259 Leiomyoma of uterus, unspecified: Secondary | ICD-10-CM

## 2024-11-06 NOTE — Assessment & Plan Note (Signed)
 Chronic anemia worsened due to recent GI bleed. Hemoglobin improved post-transfusion. - Coordinate with GI for blood work to monitor hemoglobin levels. - Discuss with GI regarding the need for iron  studies. - Keep f/u appt with Hematology.

## 2024-11-06 NOTE — Assessment & Plan Note (Signed)
 Blood pressure well-controlled during hospitalization, typically fluctuates during bleeding episodes. - Follow low sodium diet.

## 2024-11-06 NOTE — Patient Instructions (Signed)
 Peptic Ulcer  A peptic ulcer is a sore in the lining of the stomach (gastric ulcer) or the first part of the small intestine (duodenal ulcer). The ulcer causes a gradual wearing away (erosion) of the deeper tissue. What are the causes? Normally, the lining of the stomach and the small intestine protects them from the acid that digests food. The protective lining can be damaged by: An infection caused by a type of bacteria called Helicobacter pylori or H. pylori. Regular use of NSAIDs, such as ibuprofen or aspirin. Rare tumors in the stomach, small intestine, or pancreas (Zollinger-Ellison syndrome). What increases the risk? The following factors may make you more likely to develop this condition: Smoking. Having a family history of ulcer disease. Drinking alcohol. Having been hospitalized in an intensive care unit (ICU). What are the signs or symptoms? Symptoms of this condition include: Persistent burning pain in the area between the chest and the belly button. The pain may be worse on an empty stomach and at night. Heartburn. Nausea and vomiting. Bloating. If the ulcer results in bleeding, it can cause: Black, tarry stools. Vomiting of bright red blood. Vomiting of material that looks like coffee grounds. How is this diagnosed? This condition may be diagnosed based on: Your medical history and a physical exam. Various tests or procedures, such as: Upper endoscopy. The health care provider examines the esophagus, stomach, and small intestine using a small flexible tube that has a video camera at the end. Blood tests, stool tests, or breath tests to check for the H. pylori bacteria. An X-ray exam (upper gastrointestinal series) of the esophagus, stomach, and small intestine. A biopsy to help find certain causes of ulcers. A tissue sample is removed during upper endoscopy to be examined under a microscope. How is this treated? Treatment for this condition may include: Eliminating the  cause of the ulcer, such as smoking or use of NSAIDs, and limiting alcohol and caffeine intake. Medicines to reduce the amount of acid in your digestive tract. Antibiotic medicines, if the ulcer is caused by an H. pylori infection. An upper endoscopy may be used to treat a bleeding ulcer. Surgery. This may be needed if the bleeding is severe or if the ulcer created a hole somewhere in the digestive system. Follow these instructions at home: Do not drink alcohol if your health care provider tells you not to drink. Do not use any products that contain nicotine or tobacco. These products include cigarettes, chewing tobacco, and vaping devices, such as e-cigarettes. If you need help quitting, ask your health care provider. Take over-the-counter and prescription medicines only as told by your health care provider. Do not use over-the-counter medicines in place of prescription medicines unless your health care provider approves. Do not take aspirin, ibuprofen, or other NSAIDs unless your health care provider tells you to. Keep all follow-up visits. This is important. Contact a health care provider if: Your symptoms do not improve within 7 days of starting treatment. You have ongoing indigestion or heartburn. Get help right away if: You have sudden, sharp, or persistent pain in your abdomen. You have bloody or dark black, tarry stools. You vomit blood or material that looks like coffee grounds. You become light-headed or you feel faint. You become weak. You become sweaty or clammy. These symptoms may be an emergency. Get help right away. Call 911. Do not wait to see if the symptoms will go away. Do not drive yourself to the hospital. Summary A peptic ulcer is a sore  in the lining of the stomach (gastric ulcer) or the first part of the small intestine (duodenal ulcer). The ulcer causes a gradual wearing away (erosion) of the deeper tissue. Do not use any products that contain nicotine or tobacco.  These products include cigarettes, chewing tobacco, and vaping devices, such as e-cigarettes. If you need help quitting, ask your health care provider. Take over-the-counter and prescription medicines only as told by your health care provider. Do not use over-the-counter medicines in place of prescription medicines unless your health care provider approves. Limit your alcohol and caffeine intake. Keep all follow-up visits. This is important. This information is not intended to replace advice given to you by your health care provider. Make sure you discuss any questions you have with your health care provider. Document Revised: 06/21/2021 Document Reviewed: 06/22/2021 Elsevier Patient Education  2024 [employer].

## 2024-11-06 NOTE — Assessment & Plan Note (Signed)
 Recent hospitalization for upper GI bleed with large ulcer treated with EGD, epinephrine  injection, and clipping. Currently on omeprazole  and Carafate  with symptom improvement. - Continue omeprazole  40 mg twice daily until GI follow-up. - Continue Carafate  four times daily for two weeks. - Follow up with GI on January 19th for endoscopy.

## 2024-11-08 ENCOUNTER — Other Ambulatory Visit: Payer: Self-pay

## 2024-11-08 ENCOUNTER — Other Ambulatory Visit: Payer: Self-pay | Admitting: Hematology and Oncology

## 2024-11-08 ENCOUNTER — Ambulatory Visit: Payer: Self-pay | Admitting: Hematology and Oncology

## 2024-11-08 ENCOUNTER — Telehealth: Payer: Self-pay | Admitting: Pharmacy Technician

## 2024-11-08 ENCOUNTER — Inpatient Hospital Stay

## 2024-11-08 ENCOUNTER — Other Ambulatory Visit (HOSPITAL_COMMUNITY): Payer: Self-pay | Admitting: Hematology and Oncology

## 2024-11-08 ENCOUNTER — Inpatient Hospital Stay: Attending: Hematology and Oncology | Admitting: Hematology and Oncology

## 2024-11-08 VITALS — BP 143/94 | HR 73 | Temp 98.1°F | Resp 16 | Wt 140.9 lb

## 2024-11-08 DIAGNOSIS — D62 Acute posthemorrhagic anemia: Secondary | ICD-10-CM

## 2024-11-08 DIAGNOSIS — D259 Leiomyoma of uterus, unspecified: Secondary | ICD-10-CM | POA: Diagnosis not present

## 2024-11-08 DIAGNOSIS — Z800 Family history of malignant neoplasm of digestive organs: Secondary | ICD-10-CM | POA: Diagnosis not present

## 2024-11-08 DIAGNOSIS — D509 Iron deficiency anemia, unspecified: Secondary | ICD-10-CM

## 2024-11-08 DIAGNOSIS — Z803 Family history of malignant neoplasm of breast: Secondary | ICD-10-CM | POA: Insufficient documentation

## 2024-11-08 DIAGNOSIS — Z8051 Family history of malignant neoplasm of kidney: Secondary | ICD-10-CM | POA: Diagnosis not present

## 2024-11-08 LAB — CBC WITH DIFFERENTIAL/PLATELET
Abs Immature Granulocytes: 0.01 K/uL (ref 0.00–0.07)
Basophils Absolute: 0.1 K/uL (ref 0.0–0.1)
Basophils Relative: 1 %
Eosinophils Absolute: 0.2 K/uL (ref 0.0–0.5)
Eosinophils Relative: 2 %
HCT: 34.1 % — ABNORMAL LOW (ref 36.0–46.0)
Hemoglobin: 10.9 g/dL — ABNORMAL LOW (ref 12.0–15.0)
Immature Granulocytes: 0 %
Lymphocytes Relative: 33 %
Lymphs Abs: 2.1 K/uL (ref 0.7–4.0)
MCH: 28.8 pg (ref 26.0–34.0)
MCHC: 32 g/dL (ref 30.0–36.0)
MCV: 90 fL (ref 80.0–100.0)
Monocytes Absolute: 0.5 K/uL (ref 0.1–1.0)
Monocytes Relative: 8 %
Neutro Abs: 3.4 K/uL (ref 1.7–7.7)
Neutrophils Relative %: 56 %
Platelets: 269 K/uL (ref 150–400)
RBC: 3.79 MIL/uL — ABNORMAL LOW (ref 3.87–5.11)
RDW: 15.9 % — ABNORMAL HIGH (ref 11.5–15.5)
WBC: 6.3 K/uL (ref 4.0–10.5)
nRBC: 0 % (ref 0.0–0.2)

## 2024-11-08 LAB — FERRITIN: Ferritin: 49 ng/mL (ref 11–307)

## 2024-11-08 LAB — IRON AND IRON BINDING CAPACITY (CC-WL,HP ONLY)
Iron: 28 ug/dL (ref 28–170)
Saturation Ratios: 7 % — ABNORMAL LOW (ref 10.4–31.8)
TIBC: 421 ug/dL (ref 250–450)
UIBC: 393 ug/dL

## 2024-11-08 NOTE — Progress Notes (Signed)
 Venofer  300 mg times 3 order placed because of down trending ferritin, recent GI bleed and history of gastric by pass Pt knows the plan and agrees.  Lenon Kuennen

## 2024-11-08 NOTE — Addendum Note (Signed)
 Addended by: DAYNE SHERRY RAMAN on: 11/08/2024 02:56 PM   Modules accepted: Orders

## 2024-11-08 NOTE — Progress Notes (Signed)
 [facility] CONSULT NOTE  Patient Care Team: Jarold Medici, MD as PCP - General (Internal Medicine)  CHIEF COMPLAINTS/PURPOSE OF CONSULTATION:  IDA  ASSESSMENT & PLAN:   This is a very pleasant 53 yr old female patient with past medical history significant for gastric ulcers back in 2018 when she had acute blood loss anemia here for follow up. Assessment & Plan Acute blood loss anemia Acute blood loss anemia secondary to gastrointestinal hemorrhage, likely multifactorial due to uterine bleeding from leiomyoma as well as impaired iron  absorption secondary to gastric by pass.  Hemoglobin improved to 10.9 g/dL, stable, asymptomatic, no current bleeding. - Reviewed hemoglobin and ferritin from December 2. - Ordered repeat ferritin and iron  studies to assess for iron  deficiency. - Will arrange iron  infusion if repeat ferritin is low; otherwise, ok to monitor. - Will notify her of results and plan by end of day. - No further need for blood transfusion at this time.  Uterine leiomyoma post-embolization Post uterine artery embolization for symptomatic leiomyoma. Menses less heavy but irregular. Symptoms persist, expected improvement over three months per gynecology guidance. Hysterectomy remains an option if symptoms persist or worsen. - Monitored for ongoing symptoms and discussed expected timeline for improvement post-embolization (up to three months). She will follow up with gyn.    HISTORY OF PRESENTING ILLNESS:  Susan [name] 53 y.o. female is here because of IDA  This is a very pleasant 53 yr old female patient with PMH significant for gastric ulcer disease in 2018 referred to hematology for IDA. 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    Interval History  Discussed the use of AI scribe software for clinical note transcription with the patient, who gave verbal consent to proceed.  History of Present Illness Susan [name] is a 53 year old female with acute  posthemorrhagic anemia secondary to uterine leiomyoma who presents for hematology/oncology follow-up after recent hospitalization for gastrointestinal bleeding and anemia.  She was recently hospitalized for gastrointestinal bleeding, presenting with melena and a nadir hemoglobin of 7.3 g/dL. During admission, she received blood transfusions and iron  infusions. She had been taking ibuprofen  and ketorolac  prior to admission, which likely contributed to the gastrointestinal bleeding. Since discharge, she has had no recurrence of melena, and her stool color normalized after the first few days. She is no longer using NSAIDs.  Her current hemoglobin is 10.9 g/dL since discharge. Ferritin measured on December 2nd was within normal limits. She currently feels well, denies any new symptoms, and is able to perform her usual work activities. She notes increased sensitivity to cold, which she attributes to low iron  levels.  She underwent uterine artery embolization on November 7th for symptomatic uterine fibroids. The post-procedure course was complicated by significant symptoms, including a prolonged recovery and a brief episode of pneumonia. Her menstrual cycle has resumed, is less heavy than prior to the procedure, but remains irregular. She prefers to avoid hysterectomy if possible. One fibroid remains large, and she is monitoring for further changes.  Rest of the pertinent 10 point ROS reviewed and neg.  MEDICAL HISTORY:  Past Medical History:  Diagnosis Date   Anemia 2016   with bleeding ulcer   Arthritis    oa hands   Constipation    Dizziness    GERD (gastroesophageal reflux disease)    no current meds   GI bleeding    Glaucoma    Headache    headaches and migraines   History of bleeding ulcers    History of  stomach ulcers    Hypertension    Hypothyroidism    Insomnia    Iron  deficiency    Irregular periods    Osteoarthritis of both hands    PCOS (polycystic ovarian syndrome)     Pre-diabetes    Syncope    Vitamin D  deficiency     SURGICAL HISTORY: Past Surgical History:  Procedure Laterality Date   BILATERAL SALPINGECTOMY Bilateral 03/22/2013   Procedure: BILATERAL SALPINGECTOMY;  Surgeon: Dickie DELENA Carder, MD;  Location: [facility];  Service: Gynecology;  Laterality: Bilateral;   CARPAL TUNNEL RELEASE Right 11/15/2013   Procedure: RIGHT CARPAL TUNNEL RELEASE;  Surgeon: Arley JONELLE Curia, MD;  Location: [facility];  Service: Orthopedics;  Laterality: Right;   COSMETIC SURGERY  01/2015   tummy tuck    ESOPHAGOGASTRODUODENOSCOPY N/A 10/04/2015   Procedure: ESOPHAGOGASTRODUODENOSCOPY (EGD);  Surgeon: Elspeth Deward Naval, MD;  Location: [facility];  Service: Gastroenterology;  Laterality: N/A;   ESOPHAGOGASTRODUODENOSCOPY Left 10/16/2024   Procedure: EGD (ESOPHAGOGASTRODUODENOSCOPY);  Surgeon: San Sandor GAILS, DO;  Location: [facility];  Service: Gastroenterology;  Laterality: Left;   GANGLION CYST EXCISION Right 11/15/2013   Procedure: EXCISION CYST RIGHT WRIST;  Surgeon: Arley JONELLE Curia, MD;  Location: [facility];  Service: Orthopedics;  Laterality: Right;   IR EMBO TUMOR ORGAN ISCHEMIA INFARCT INC GUIDE ROADMAPPING  10/01/2024   IR RADIOLOGIST EVAL & MGMT  09/08/2024   LAPAROSCOPIC GASTRIC BAND REMOVAL WITH LAPAROSCOPIC GASTRIC SLEEVE RESECTION N/A 09/23/2017   Procedure: LAPAROSCOPIC GASTRIC BAND REMOVAL WITH LAPAROSCOPIC GASTRIC SLEEVE RESECTION;  Surgeon: Gladis Cough, MD;  Location: [facility];  Service: General;  Laterality: N/A;   LAPAROSCOPIC GASTRIC BANDING  2010   LAPAROSCOPY N/A 03/22/2013   Procedure: LAPAROSCOPY OPERATIVE;  Surgeon: Dickie DELENA Carder, MD;  Location: [facility];  Service: Gynecology;  Laterality: N/A;   MOUTH SURGERY  1991   REDUCTION MAMMAPLASTY Bilateral 2023   SALPINGECTOMY  03/22/2013    SOCIAL HISTORY: Social History   Socioeconomic History   Marital status: Married    Spouse name: Librarian, Academic    Number of children: Not on file   Years of education: Not on file   Highest education level: Professional school degree (e.g., MD, DDS, DVM, JD)  Occupational History   Occupation: Pediatric Dentist  Tobacco Use   Smoking status: Never   Smokeless tobacco: Never  Vaping Use   Vaping status: Never Used  Substance and Sexual Activity   Alcohol use: Yes    Alcohol/week: 1.0 standard drink of alcohol    Types: 1 Standard drinks or equivalent per week    Comment: socially   Drug use: No   Sexual activity: Not on file  Other Topics Concern   Not on file  Social History Narrative   Not on file   Social Drivers of Health   Tobacco Use: Low Risk (10/26/2024)   Patient History    Smoking Tobacco Use: Never    Smokeless Tobacco Use: Never    Passive Exposure: Not on file  Financial Resource Strain: Low Risk (08/04/2023)   Overall Financial Resource Strain (CARDIA)    Difficulty of Paying Living Expenses: Not very hard  Food Insecurity: No Food Insecurity (10/16/2024)   Epic    Worried About Radiation Protection Practitioner of Food in the Last Year: Never true    Ran Out of Food in the Last Year: Never true  Transportation Needs: No Transportation Needs (10/16/2024)   Epic    Lack of Transportation (Medical): No  Lack of Transportation (Non-Medical): No  Physical Activity: Unknown (08/04/2023)   Exercise Vital Sign    Days of Exercise per Week: 0 days    Minutes of Exercise per Session: Not on file  Stress: Stress Concern Present (08/04/2023)   [employer] of Occupational Health - Occupational Stress Questionnaire    Feeling of Stress : To some extent  Social Connections: Socially Integrated (08/04/2023)   Social Connection and Isolation Panel    Frequency of Communication with Friends and Family: More than three times a week    Frequency of Social Gatherings with Friends and Family: Twice a week    Attends Religious Services: 1 to 4 times per year    Active Member of [employer] or Organizations: Yes     Attends Banker Meetings: More than 4 times per year    Marital Status: Married  Catering Manager Violence: Not At Risk (10/16/2024)   Epic    Fear of Current or Ex-Partner: No    Emotionally Abused: No    Physically Abused: No    Sexually Abused: No  Depression (PHQ2-9): Low Risk (10/26/2024)   Depression (PHQ2-9)    PHQ-2 Score: 0  Alcohol Screen: Low Risk (08/04/2023)   Alcohol Screen    Last Alcohol Screening Score (AUDIT): 3  Housing: Low Risk (10/16/2024)   Epic    Unable to Pay for Housing in the Last Year: No    Number of Times Moved in the Last Year: 0    Homeless in the Last Year: No  Utilities: Not At Risk (10/16/2024)   Epic    Threatened with loss of utilities: No  Health Literacy: Not on file    FAMILY HISTORY: Family History  Problem Relation Age of Onset   Hypertension Mother    Cancer Mother        pancreatic   Stroke Mother    Diabetes Mother    Diabetes gravidarum Mother    High blood pressure Mother    Heart disease Mother    Obesity Mother    Hypertension Father    Ulcers Father    Diabetes Father    Thyroid  disease Father    CAD Maternal Grandmother    Kidney cancer Maternal Grandmother    Diabetes Paternal Grandmother    Heart attack Paternal Grandfather    Breast cancer Cousin        Maternal 1 st cousin   Hypertension Brother     ALLERGIES:  is allergic to crestor  [rosuvastatin ].  MEDICATIONS:  Current Outpatient Medications  Medication Sig Dispense Refill   acetaminophen  (TYLENOL ) 325 MG tablet Take 2 tablets (650 mg total) by mouth every 6 (six) hours as needed for mild pain (pain score 1-3) or fever (or Fever >/= 101).     Cyanocobalamin  (B-12 PO) Take 1 tablet by mouth daily.     Eszopiclone  3 MG TABS TAKE 1 TABLET BY MOUTH IMMEDIATELY BEFORE BEDTIME (Patient taking differently: Take 3 mg by mouth at bedtime as needed (Sleep).) 30 tablet 1   Evolocumab  (REPATHA  SURECLICK) 140 MG/ML SOAJ ADMINISTER 1 ML UNDER THE SKIN  EVERY 14 DAYS 2 mL 2   Iron -FA-B Cmp-C-Biot-Probiotic (FUSION PLUS) CAPS TAKE 1 CAPSULE BY MOUTH EVERY DAY (Patient taking differently: Take 1 capsule by mouth every 7 (seven) days.) 30 capsule 4   Multiple Vitamin (MULTIVITAMIN PO) Take 1 tablet by mouth daily.     omeprazole  (PRILOSEC) 40 MG capsule Take 1 capsule (40 mg total) by mouth 2 (two)  times daily with a meal. Open capsule and place contents in applesauce or yogurt for consumption. 112 capsule 0   oxyCODONE -acetaminophen  (PERCOCET/ROXICET) 5-325 MG tablet Take 1-2 tablets by mouth every 4 (four) hours as needed for severe pain (pain score 7-10).     promethazine  (PHENERGAN ) 12.5 MG tablet Take 1 tablet (12.5 mg total) by mouth every 4 (four) hours as needed for nausea or vomiting. 30 tablet 0   sucralfate  (CARAFATE ) 1 g tablet Take 1 tablet (1 g total) by mouth 4 (four) times daily -  with meals and at bedtime for 14 days. 56 tablet 0   valsartan -hydrochlorothiazide  (DIOVAN  HCT) 160-12.5 MG tablet Take 1 tablet by mouth daily. (Patient taking differently: Take 1 tablet by mouth at bedtime.) 90 tablet 2   No current facility-administered medications for this visit.     PHYSICAL EXAMINATION: ECOG PERFORMANCE STATUS: 0 - Asymptomatic  Vitals:   11/08/24 0847  BP: (!) 143/94  Pulse: 73  Resp: 16  Temp: 98.1 F (36.7 C)  SpO2: 100%    Filed Weights   11/08/24 0847  Weight: 140 lb 14.4 oz (63.9 kg)   GENERAL:alert, no distress and comfortable SKIN: skin color, texture, turgor are normal,  NECK: supple, LYMPH:  no palpable lymphadenopathy in the cervical, axillary  LUNGS: clear to auscultation and percussion with normal breathing effort HEART: regular rate & rhythm and no murmurs and no lower extremity edema ABDOMEN:abdomen soft, non-tender and normal bowel sounds Musculoskeletal:no cyanosis of digits PSYCH: alert & oriented x 3 with fluent speech NEURO: no focal motor/sensory deficits  LABORATORY DATA:  I have reviewed  the data as listed Lab Results  Component Value Date   WBC 6.3 11/08/2024   HGB 10.9 (L) 11/08/2024   HCT 34.1 (L) 11/08/2024   MCV 90.0 11/08/2024   PLT 269 11/08/2024     Chemistry      Component Value Date/Time   NA 142 10/17/2024 0210   NA 140 07/28/2024 1041   K 3.8 10/17/2024 0210   CL 109 10/17/2024 0210   CO2 25 10/17/2024 0210   BUN 9 10/17/2024 0210   BUN 11 07/28/2024 1041   CREATININE 1.00 10/17/2024 0210      Component Value Date/Time   CALCIUM  8.4 (L) 10/17/2024 0210   ALKPHOS 47 10/17/2024 0210   AST 47 (H) 10/17/2024 0210   ALT 42 10/17/2024 0210   BILITOT 0.5 10/17/2024 0210   BILITOT 0.6 07/28/2024 1041       RADIOGRAPHIC STUDIES: I have personally reviewed the radiological images as listed and agreed with the findings in the report. CT ABDOMEN PELVIS W CONTRAST Result Date: 10/09/2024 CLINICAL DATA:  Right flank and lower quadrant pain. EXAM: CT ABDOMEN AND PELVIS WITH CONTRAST TECHNIQUE: Multidetector CT imaging of the abdomen and pelvis was performed using the standard protocol following bolus administration of intravenous contrast. RADIATION DOSE REDUCTION: This exam was performed according to the departmental dose-optimization program which includes automated exposure control, adjustment of the mA and/or kV according to patient size and/or use of iterative reconstruction technique. CONTRAST:  OMNIPAQUE  IOHEXOL  300 MG/ML  SOLN COMPARISON:  04/04/2023 FINDINGS: Lower chest: Focal airspace consolidation in the posterior right lower lobe is consistent with pneumonia. Hepatobiliary: No suspicious focal abnormality within the liver parenchyma. There is no evidence for gallstones, gallbladder wall thickening, or pericholecystic fluid. No intrahepatic or extrahepatic biliary dilation. Pancreas: No focal mass lesion. No dilatation of the main duct. No intraparenchymal cyst. No peripancreatic edema. Spleen: No  splenomegaly. No suspicious focal mass lesion.  Adrenals/Urinary Tract: No adrenal nodule or mass. Kidneys unremarkable. No evidence for hydroureter. The urinary bladder appears normal for the degree of distention. Stomach/Bowel: Status post bariatric surgery. No small bowel wall thickening. No small bowel dilatation. The terminal ileum is normal. The appendix is normal. No gross colonic mass. No colonic wall thickening. Vascular/Lymphatic: No abdominal aortic aneurysm. Portal vein and superior mesenteric vein are patent. There is no gastrohepatic or hepatoduodenal ligament lymphadenopathy. No retroperitoneal or mesenteric lymphadenopathy. No pelvic sidewall lymphadenopathy. Reproductive: 6 x 06.9 x 5.8 cm large uterine fibroid evident. Multiple foci of branching gas attenuation are seen centrally within the fibroid. A smaller left-sided 2.3 cm fibroid evident. This fibroid measures larger than on pelvic MRI of 05/04/2024. Patient is status post fibroid embolization on 10/01/2024. There is marked thinning of the overlying myometrium at the uterine fundus, more prominent than on the prior study. No adnexal mass. Other: No substantial intraperitoneal free fluid. Musculoskeletal: No worrisome lytic or sclerotic osseous abnormality. IMPRESSION: 1. Focal airspace consolidation in the posterior right lower lobe is consistent with pneumonia. 2. 6 x 06.9 x 5.8 cm uterine fibroid appears edematous and has increased in size since previous MRI, likely as results of recent embolization. Multiple foci of branching gas attenuation are seen centrally within the fibroid inter most likely related to embolization although infection should be excluded. There is marked thinning of the overlying myometrium at the uterine fundus, more prominent than on the prior study. No evidence for extra uterine fluid or debris at this time but close follow-up warranted. Electronically Signed   By: Camellia Candle M.D.   On: 10/09/2024 10:38    All questions were answered. The patient knows to  call the clinic with any problems, questions or concerns. I spent 30 minutes in the care of this patient including H and P, review of records, counseling and coordination of care.     Amber Stalls, MD 11/08/2024 9:35 AM

## 2024-11-08 NOTE — Telephone Encounter (Signed)
 Auth Submission: NO AUTH NEEDED Site of care: Site of care: [facility] Payer: [employer] Medication & CPT/J Code(s) submitted: Venofer  (Iron  Sucrose) J1756 Diagnosis Code: D50.9 Route of submission (phone, fax, portal):  Phone # Fax # Auth type: Buy/Bill PB Units/visits requested: X3 Reference number:  Approval from: 11/08/24 to 11/24/24

## 2024-11-10 ENCOUNTER — Other Ambulatory Visit

## 2024-11-12 ENCOUNTER — Ambulatory Visit: Admitting: Hematology and Oncology

## 2024-11-24 ENCOUNTER — Other Ambulatory Visit

## 2024-12-01 ENCOUNTER — Ambulatory Visit (INDEPENDENT_AMBULATORY_CARE_PROVIDER_SITE_OTHER)

## 2024-12-01 VITALS — BP 112/72 | HR 75 | Temp 98.5°F | Resp 14 | Ht 62.0 in | Wt 143.0 lb

## 2024-12-01 DIAGNOSIS — D649 Anemia, unspecified: Secondary | ICD-10-CM

## 2024-12-01 DIAGNOSIS — D62 Acute posthemorrhagic anemia: Secondary | ICD-10-CM

## 2024-12-01 DIAGNOSIS — D500 Iron deficiency anemia secondary to blood loss (chronic): Secondary | ICD-10-CM

## 2024-12-01 MED ORDER — IRON SUCROSE 300 MG IVPB - SIMPLE MED
300.0000 mg | Freq: Once | Status: AC
  Administered 2024-12-01: 300 mg via INTRAVENOUS
  Filled 2024-12-01: qty 265

## 2024-12-01 NOTE — Progress Notes (Signed)
 Diagnosis: Acute Anemia  Provider:  Lonna Coder MD  Procedure: IV Infusion  IV Type: Peripheral, IV Location: L Antecubital  Venofer  (Iron  Sucrose), Dose: 300 mg  Infusion Start Time: 1313  Infusion Stop Time: 1448  Post Infusion IV Care: Patient declined observation and Peripheral IV Discontinued  Discharge: Condition: Stable, Destination: Home . AVS Declined  Performed by:  Donny Childes, RN

## 2024-12-06 ENCOUNTER — Encounter: Payer: Self-pay | Admitting: Hematology and Oncology

## 2024-12-06 ENCOUNTER — Encounter: Payer: Self-pay | Admitting: Gastroenterology

## 2024-12-07 ENCOUNTER — Telehealth: Payer: Self-pay | Admitting: Pharmacy Technician

## 2024-12-07 NOTE — Telephone Encounter (Signed)
 Auth Submission: NO AUTH NEEDED Site of care: Site of care: [facility] Payer: [employer] Medication & CPT/J Code(s) submitted: Venofer  (Iron  Sucrose) J1756 Diagnosis Code: D50.9 Route of submission (phone, fax, portal): n/a Phone # Fax # Auth type: Buy/Bill PB Units/visits requested: 300mg  x 3  Reference number: n/a Approval from: 12/06/2024 to 03/24/2025

## 2024-12-08 ENCOUNTER — Ambulatory Visit (INDEPENDENT_AMBULATORY_CARE_PROVIDER_SITE_OTHER)

## 2024-12-08 VITALS — BP 138/86 | HR 69 | Temp 98.5°F | Resp 18 | Ht 62.0 in | Wt 141.0 lb

## 2024-12-08 DIAGNOSIS — D500 Iron deficiency anemia secondary to blood loss (chronic): Secondary | ICD-10-CM

## 2024-12-08 DIAGNOSIS — D62 Acute posthemorrhagic anemia: Secondary | ICD-10-CM

## 2024-12-08 DIAGNOSIS — D649 Anemia, unspecified: Secondary | ICD-10-CM

## 2024-12-08 MED ORDER — IRON SUCROSE 300 MG IVPB - SIMPLE MED
300.0000 mg | Freq: Once | Status: AC
  Administered 2024-12-08: 300 mg via INTRAVENOUS
  Filled 2024-12-08: qty 265

## 2024-12-08 NOTE — Progress Notes (Signed)
 Diagnosis: Iron  Deficiency Anemia  Provider:  Praveen Mannam MD  Procedure: IV Infusion  IV Type: Peripheral, IV Location: L Forearm  Venofer  (Iron  Sucrose), Dose: 300 mg  Infusion Start Time: 1311  Infusion Stop Time: 1450  Post Infusion IV Care: Patient declined observation and Peripheral IV Discontinued  Discharge: Condition: Good, Destination: Home . AVS Declined  Performed by:  Kenidy Crossland, RN

## 2024-12-13 ENCOUNTER — Ambulatory Visit: Admitting: Gastroenterology

## 2024-12-13 ENCOUNTER — Encounter: Payer: Self-pay | Admitting: Gastroenterology

## 2024-12-13 VITALS — BP 144/85 | HR 62 | Temp 98.4°F | Resp 9 | Ht 62.0 in | Wt 140.0 lb

## 2024-12-13 DIAGNOSIS — D509 Iron deficiency anemia, unspecified: Secondary | ICD-10-CM | POA: Diagnosis not present

## 2024-12-13 DIAGNOSIS — K2950 Unspecified chronic gastritis without bleeding: Secondary | ICD-10-CM | POA: Diagnosis not present

## 2024-12-13 DIAGNOSIS — Z9884 Bariatric surgery status: Secondary | ICD-10-CM | POA: Diagnosis not present

## 2024-12-13 DIAGNOSIS — K289 Gastrojejunal ulcer, unspecified as acute or chronic, without hemorrhage or perforation: Secondary | ICD-10-CM | POA: Diagnosis present

## 2024-12-13 DIAGNOSIS — T182XXA Foreign body in stomach, initial encounter: Secondary | ICD-10-CM

## 2024-12-13 DIAGNOSIS — Z980 Intestinal bypass and anastomosis status: Secondary | ICD-10-CM

## 2024-12-13 DIAGNOSIS — K922 Gastrointestinal hemorrhage, unspecified: Secondary | ICD-10-CM

## 2024-12-13 MED ORDER — LANSOPRAZOLE 15 MG PO TBDD: 30.0000 mg | DELAYED_RELEASE_TABLET | Freq: Two times a day (BID) | ORAL | Status: AC

## 2024-12-13 MED ORDER — SODIUM CHLORIDE 0.9 % IV SOLN: 500.0000 mL | INTRAVENOUS | Status: DC

## 2024-12-13 MED ORDER — SUCRALFATE 1 GM/10ML PO SUSP: 1.0000 g | Freq: Four times a day (QID) | ORAL | 3 refills | Status: AC

## 2024-12-13 MED ORDER — SUCRALFATE 1 G PO TABS: 1.0000 g | ORAL_TABLET | Freq: Three times a day (TID) | ORAL | 0 refills | Status: AC

## 2024-12-13 NOTE — Progress Notes (Signed)
 Patient will call to make follow up appointment with Dr. San in 3 months; care partner aware.  All other appointments scheduled including pre visit.

## 2024-12-13 NOTE — Progress Notes (Signed)
 Called to room to assist during endoscopic procedure.  Patient ID and intended procedure confirmed with present staff. Received instructions for my participation in the procedure from the performing physician.

## 2024-12-13 NOTE — Patient Instructions (Signed)
 YOU HAD AN ENDOSCOPIC PROCEDURE TODAY AT THE [facility]:   Refer to the procedure report that was given to you for any specific questions about what was found during the examination.  If the procedure report does not answer your questions, please call your gastroenterologist to clarify.  If you requested that your care partner not be given the details of your procedure findings, then the procedure report has been included in a sealed envelope for you to review at your convenience later.  YOU SHOULD EXPECT: Some feelings of bloating in the abdomen. Passage of more gas than usual.  Walking can help get rid of the air that was put into your GI tract during the procedure and reduce the bloating. If you had a lower endoscopy (such as a colonoscopy or flexible sigmoidoscopy) you may notice spotting of blood in your stool or on the toilet paper. If you underwent a bowel prep for your procedure, you may not have a normal bowel movement for a few days.  Please Note:  You might notice some irritation and congestion in your nose or some drainage.  This is from the oxygen used during your procedure.  There is no need for concern and it should clear up in a day or so.  SYMPTOMS TO REPORT IMMEDIATELY:  Following upper endoscopy (EGD)  Vomiting of blood or coffee ground material  New chest pain or pain under the shoulder blades  Painful or persistently difficult swallowing  New shortness of breath  Fever of 100F or higher  Black, tarry-looking stools  Resume previous diet Use Prevacid  solutabs - 30 mg by mouth twice daily for 8 weeks, then reduce to 30 mg daily Sucralfate  suspension - 1 gram by mouth four times a day for 8 weeks Can stop omeprazole  when starting the Prevacid  Repeat upper endoscopy in 6-8 weeks to check healing Return to GI office in 3 months or sooner as needed Avoid NSAIDS Complete IV iron  as scheduled with follow up in the Hematology Clinic afterwards with repeat CBC and  iron  panel   For urgent or emergent issues, a gastroenterologist can be reached at any hour by calling [phone]. Do not use MyChart messaging for urgent concerns.    DIET:  We do recommend a small meal at first, but then you may proceed to your regular diet.  Drink plenty of fluids but you should avoid alcoholic beverages for 24 hours.  ACTIVITY:  You should plan to take it easy for the rest of today and you should NOT DRIVE or use heavy machinery until tomorrow (because of the sedation medicines used during the test).    FOLLOW UP: Our staff will call the number listed on your records the next business day following your procedure.  We will call around 7:15- 8:00 am to check on you and address any questions or concerns that you may have regarding the information given to you following your procedure. If we do not reach you, we will leave a message.     If any biopsies were taken you will be contacted by phone or by letter within the next 1-3 weeks.  Please call us  at [phone] if you have not heard about the biopsies in 3 weeks.    SIGNATURES/CONFIDENTIALITY: You and/or your care partner have signed paperwork which will be entered into your electronic medical record.  These signatures attest to the fact that that the information above on your After Visit Summary has been reviewed and is understood.  Full responsibility of the confidentiality of this discharge information lies with you and/or your care-partner.

## 2024-12-13 NOTE — Progress Notes (Signed)
 Sedate, gd SR, tolerated procedure well, VSS, report to RN

## 2024-12-13 NOTE — Progress Notes (Signed)
 "   GASTROENTEROLOGY PROCEDURE H&P NOTE   Primary Care Physician: Jarold Medici, MD    Reason for Procedure:  Marginal ulcer, anemia, gastritis, hospital follow-up  Plan:    EGD  Patient is appropriate for endoscopic procedure(s) in the ambulatory ([facility]) setting.  The nature of the procedure, as well as the risks, benefits, and alternatives were carefully and thoroughly reviewed with the patient. Ample time for discussion and questions allowed. The patient understood, was satisfied, and agreed to proceed. I personally addressed all patient questions and concerns.     HPI: Susan [name] is a 54 y.o. female who presents for EGD for follow-up on marginal ulcer and gastritis with associated anemia.  Hospital admission in 09/2024 with epigastric pain, melena, acute blood loss anemia.  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  There was 1 retained surgical staple that was not removed, along with moderate gastritis in the gastric fundus.  The esophagus and visualized small bowel were otherwise normal-appearing.  Hemoglobin nadir 7.3 and transfused 1 unit RBCs and was treated with high-dose IV Protonix  as inpatient, then transition to omeprazole  40 mg twice daily (opening capsule) x [redacted] weeks along with sucralfate  x 2 weeks.    She presents today for reevaluation.  Since hospital discharge, H/H has continued to uptrend with most recent CBC on 11/08/2024 with H/H10.9/34 and improved iron  indices with ferritin 49, iron  28, TIBC 421, sat 7%.  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  Was seen in the outpatient Hematology Clinic on 11/08/2024 and received IV iron  on 12/01/2024 and 12/08/2024.  Prior baseline hemoglobin was 12.7.  Has been avoiding NSAIDs (took  once).  Did notice black stools 2 weeks ago, lasting for 7 days, and has since resolved.   Past Medical History:  Diagnosis Date   Anemia 2016   with bleeding ulcer   Arthritis    oa hands   Constipation    Dizziness    GERD (gastroesophageal reflux disease)    no current meds   GI bleeding    Glaucoma    Headache    headaches and migraines   History of bleeding ulcers    History of stomach ulcers    Hypertension    Hypothyroidism    Insomnia    Iron  deficiency    Irregular periods    Osteoarthritis of both hands    PCOS (polycystic ovarian syndrome)    Pre-diabetes    Syncope    Vitamin D  deficiency     Past Surgical History:  Procedure Laterality Date   BILATERAL SALPINGECTOMY Bilateral 03/22/2013   Procedure: BILATERAL SALPINGECTOMY;  Surgeon: Dickie DELENA Carder, MD;  Location: [facility];  Service: Gynecology;  Laterality: Bilateral;   CARPAL TUNNEL RELEASE Right 11/15/2013   Procedure: RIGHT CARPAL TUNNEL RELEASE;  Surgeon: Arley JONELLE Curia, MD;  Location: [facility];  Service: Orthopedics;  Laterality: Right;   COSMETIC SURGERY  01/2015   tummy tuck    ESOPHAGOGASTRODUODENOSCOPY N/A 10/04/2015   Procedure: ESOPHAGOGASTRODUODENOSCOPY (EGD);  Surgeon: Elspeth Deward Naval, MD;  Location: [facility];  Service: Gastroenterology;  Laterality: N/A;   ESOPHAGOGASTRODUODENOSCOPY Left 10/16/2024   Procedure: EGD (ESOPHAGOGASTRODUODENOSCOPY);  Surgeon: San Sandor GAILS, DO;  Location: [facility];  Service: Gastroenterology;  Laterality: Left;  GANGLION CYST EXCISION Right 11/15/2013   Procedure: EXCISION CYST RIGHT WRIST;  Surgeon: Arley JONELLE Curia, MD;  Location: [facility];  Service: Orthopedics;  Laterality: Right;   IR EMBO TUMOR ORGAN ISCHEMIA INFARCT INC GUIDE ROADMAPPING  10/01/2024   IR RADIOLOGIST EVAL & MGMT  09/08/2024   LAPAROSCOPIC GASTRIC BAND REMOVAL WITH LAPAROSCOPIC GASTRIC SLEEVE RESECTION N/A 09/23/2017   Procedure:  LAPAROSCOPIC GASTRIC BAND REMOVAL WITH LAPAROSCOPIC GASTRIC SLEEVE RESECTION;  Surgeon: Gladis Cough, MD;  Location: [facility];  Service: General;  Laterality: N/A;   LAPAROSCOPIC GASTRIC BANDING  2010   LAPAROSCOPY N/A 03/22/2013   Procedure: LAPAROSCOPY OPERATIVE;  Surgeon: Dickie DELENA Carder, MD;  Location: [facility];  Service: Gynecology;  Laterality: N/A;   MOUTH SURGERY  1991   REDUCTION MAMMAPLASTY Bilateral 2023   SALPINGECTOMY  03/22/2013    Prior to Admission medications  Medication Sig Start Date End Date Taking? Authorizing Provider  acetaminophen  (TYLENOL ) 325 MG tablet Take 2 tablets (650 mg total) by mouth every 6 (six) hours as needed for mild pain (pain score 1-3) or fever (or Fever >/= 101). 10/18/24   Danton Reyes DASEN, MD  Cyanocobalamin  (B-12 PO) Take 1 tablet by mouth daily.    [provider]  Eszopiclone  3 MG TABS TAKE 1 TABLET BY MOUTH IMMEDIATELY BEFORE BEDTIME Patient taking differently: Take 3 mg by mouth at bedtime as needed (Sleep). 07/28/24   Jarold Medici, MD  Evolocumab  (REPATHA  SURECLICK) 140 MG/ML SOAJ ADMINISTER 1 ML UNDER THE SKIN EVERY 14 DAYS 09/16/24   Raford Riggs, MD  Iron -FA-B Cmp-C-Biot-Probiotic (FUSION PLUS) CAPS TAKE 1 CAPSULE BY MOUTH EVERY DAY Patient taking differently: Take 1 capsule by mouth every 7 (seven) days. 04/28/24   Jarold Medici, MD  Multiple Vitamin (MULTIVITAMIN PO) Take 1 tablet by mouth daily.    [provider]  omeprazole  (PRILOSEC) 40 MG capsule Take 1 capsule (40 mg total) by mouth 2 (two) times daily with a meal. Open capsule and place contents in applesauce or yogurt for consumption. 10/18/24   Danton Reyes DASEN, MD  oxyCODONE -acetaminophen  (PERCOCET/ROXICET) 5-325 MG tablet Take 1-2 tablets by mouth every 4 (four) hours as needed for severe pain (pain score 7-10).    [provider]  promethazine  (PHENERGAN ) 12.5 MG tablet Take 1 tablet (12.5 mg total) by mouth every 4 (four) hours as needed  for nausea or vomiting. 09/29/24   Covington, Jamie R, NP  sucralfate  (CARAFATE ) 1 g tablet Take 1 tablet (1 g total) by mouth 4 (four) times daily -  with meals and at bedtime for 14 days. 10/18/24 11/01/24  Danton Reyes DASEN, MD  valsartan -hydrochlorothiazide  (DIOVAN  HCT) 160-12.5 MG tablet Take 1 tablet by mouth daily. Patient taking differently: Take 1 tablet by mouth at bedtime. 07/28/24 07/28/25  Jarold Medici, MD    Current Outpatient Medications  Medication Sig Dispense Refill   acetaminophen  (TYLENOL ) 325 MG tablet Take 2 tablets (650 mg total) by mouth every 6 (six) hours as needed for mild pain (pain score 1-3) or fever (or Fever >/= 101).     Cyanocobalamin  (B-12 PO) Take 1 tablet by mouth daily.     Eszopiclone  3 MG TABS TAKE 1 TABLET BY MOUTH IMMEDIATELY BEFORE BEDTIME (Patient taking differently: Take 3 mg by mouth at bedtime as needed (Sleep).) 30 tablet 1   Evolocumab  (REPATHA  SURECLICK) 140 MG/ML SOAJ ADMINISTER 1 ML UNDER THE SKIN EVERY 14 DAYS 2 mL 2   Iron -FA-B Cmp-C-Biot-Probiotic (FUSION PLUS) CAPS TAKE 1 CAPSULE  BY MOUTH EVERY DAY (Patient taking differently: Take 1 capsule by mouth every 7 (seven) days.) 30 capsule 4   Multiple Vitamin (MULTIVITAMIN PO) Take 1 tablet by mouth daily.     omeprazole  (PRILOSEC) 40 MG capsule Take 1 capsule (40 mg total) by mouth 2 (two) times daily with a meal. Open capsule and place contents in applesauce or yogurt for consumption. 112 capsule 0   oxyCODONE -acetaminophen  (PERCOCET/ROXICET) 5-325 MG tablet Take 1-2 tablets by mouth every 4 (four) hours as needed for severe pain (pain score 7-10).     promethazine  (PHENERGAN ) 12.5 MG tablet Take 1 tablet (12.5 mg total) by mouth every 4 (four) hours as needed for nausea or vomiting. 30 tablet 0   sucralfate  (CARAFATE ) 1 g tablet Take 1 tablet (1 g total) by mouth 4 (four) times daily -  with meals and at bedtime for 14 days. 56 tablet 0   valsartan -hydrochlorothiazide  (DIOVAN  HCT) 160-12.5 MG  tablet Take 1 tablet by mouth daily. (Patient taking differently: Take 1 tablet by mouth at bedtime.) 90 tablet 2   No current facility-administered medications for this visit.    Allergies as of 12/13/2024 - Review Complete 12/13/2024  Allergen Reaction Noted   Crestor  [rosuvastatin ]  04/04/2020    Family History  Problem Relation Age of Onset   Hypertension Mother    Cancer Mother        pancreatic   Stroke Mother    Diabetes Mother    Diabetes gravidarum Mother    High blood pressure Mother    Heart disease Mother    Obesity Mother    Hypertension Father    Ulcers Father    Diabetes Father    Thyroid  disease Father    CAD Maternal Grandmother    Kidney cancer Maternal Grandmother    Diabetes Paternal Grandmother    Heart attack Paternal Grandfather    Breast cancer Cousin        Maternal 1 st cousin   Hypertension Brother     Social History   Socioeconomic History   Marital status: Married    Spouse name: Librarian, Academic   Number of children: Not on file   Years of education: Not on file   Highest education level: Professional school degree (e.g., MD, DDS, DVM, JD)  Occupational History   Occupation: Pediatric Dentist  Tobacco Use   Smoking status: Never   Smokeless tobacco: Never  Vaping Use   Vaping status: Never Used  Substance and Sexual Activity   Alcohol use: Yes    Alcohol/week: 1.0 standard drink of alcohol    Types: 1 Standard drinks or equivalent per week    Comment: socially   Drug use: No   Sexual activity: Not on file  Other Topics Concern   Not on file  Social History Narrative   Not on file   Social Drivers of Health   Tobacco Use: Low Risk (10/26/2024)   Patient History    Smoking Tobacco Use: Never    Smokeless Tobacco Use: Never    Passive Exposure: Not on file  Financial Resource Strain: Low Risk (08/04/2023)   Overall Financial Resource Strain (CARDIA)    Difficulty of Paying Living Expenses: Not very hard  Food Insecurity: No Food  Insecurity (10/16/2024)   Epic    Worried About Radiation Protection Practitioner of Food in the Last Year: Never true    Ran Out of Food in the Last Year: Never true  Transportation Needs: No Transportation Needs (10/16/2024)   Epic  Lack of Transportation (Medical): No    Lack of Transportation (Non-Medical): No  Physical Activity: Unknown (08/04/2023)   Exercise Vital Sign    Days of Exercise per Week: 0 days    Minutes of Exercise per Session: Not on file  Stress: Stress Concern Present (08/04/2023)   [employer] of Occupational Health - Occupational Stress Questionnaire    Feeling of Stress : To some extent  Social Connections: Socially Integrated (08/04/2023)   Social Connection and Isolation Panel    Frequency of Communication with Friends and Family: More than three times a week    Frequency of Social Gatherings with Friends and Family: Twice a week    Attends Religious Services: 1 to 4 times per year    Active Member of [employer] or Organizations: Yes    Attends Banker Meetings: More than 4 times per year    Marital Status: Married  Catering Manager Violence: Not At Risk (10/16/2024)   Epic    Fear of Current or Ex-Partner: No    Emotionally Abused: No    Physically Abused: No    Sexually Abused: No  Depression (PHQ2-9): Low Risk (10/26/2024)   Depression (PHQ2-9)    PHQ-2 Score: 0  Alcohol Screen: Low Risk (08/04/2023)   Alcohol Screen    Last Alcohol Screening Score (AUDIT): 3  Housing: Low Risk (10/16/2024)   Epic    Unable to Pay for Housing in the Last Year: No    Number of Times Moved in the Last Year: 0    Homeless in the Last Year: No  Utilities: Not At Risk (10/16/2024)   Epic    Threatened with loss of utilities: No  Health Literacy: Not on file    Physical Exam: Vital signs in last 24 hours: @There  were no vitals taken for this visit. GEN: NAD EYE: Sclerae anicteric ENT: MMM CV: Non-tachycardic Pulm: CTA b/l GI: Soft, NT/ND NEURO:  Alert & Oriented x  3   Sandor Flatter, DO [facility]   12/13/2024 12:26 PM  "

## 2024-12-13 NOTE — Op Note (Signed)
 [facility] Patient Name: Susan [name] Procedure Date: 12/13/2024 1:05 PM MRN: [id number] Endoscopist: Sandor Flatter , MD, [id number] Age: 54 Referring MD:  Date of Birth: Aug 21, 1971 Gender: Female Account #: [id number] Procedure:                Upper GI endoscopy with biopsy and foreign body                            (staple) removal Indications:              Iron  deficiency anemia, Melena, Follow-up of                            gastrojejunal ulcer Medicines:                Monitored Anesthesia Care Procedure:                Pre-Anesthesia Assessment:                           - Prior to the procedure, a History and Physical                            was performed, and patient medications and                            allergies were reviewed. The patient's tolerance of                            previous anesthesia was also reviewed. The risks                            and benefits of the procedure and the sedation                            options and risks were discussed with the patient.                            All questions were answered, and informed consent                            was obtained. Prior Anticoagulants: The patient has                            taken no anticoagulant or antiplatelet agents. ASA                            Grade Assessment: II - A patient with mild systemic                            disease. After reviewing the risks and benefits,                            the patient was deemed in satisfactory condition to  undergo the procedure.                           After obtaining informed consent, the endoscope was                            passed under direct vision. Throughout the                            procedure, the patient's blood pressure, pulse, and                            oxygen saturations were monitored continuously. The                            GIF [id number] #[id number] was introduced  through the                            mouth, and advanced to the jejunum. The upper GI                            endoscopy was accomplished without difficulty. The                            patient tolerated the procedure well. Scope In: Scope Out: Findings:                 The examined esophagus was normal.                           Evidence of a Roux-en-Y gastrojejunostomy was                            found. The gastrojejunal anastomosis was                            characterized by a marginal ulcer measuring 6 mm.                            There was no visible vessel or high-grade stigmata                            of recent bleeding. This overall appeared improved                            from the previous endoscopy. This was located                            across from a visible staple. The staple was                            removed with cold forceps. The ulcer was biopsied                            using cold forceps  and sent for histology and                            Helicobacter pylori testing. There was a visible                            surgical suture noted as well. Estimated blood loss                            was minimal.                           The visualized mucosa in the remnant stomach was                            otherwise normal-appearing. The previously noted                            gastritis has since resolved. Biopsies were taken                            with a cold forceps for histology and Helicobacter                            pylori testing.                           The examined jejunum was normal. This was biopsied                            with a cold forceps for histology. Estimated blood                            loss was minimal. Complications:            No immediate complications. Estimated Blood Loss:     Estimated blood loss was minimal. Impression:               - Normal esophagus.                           -  Roux-en-Y gastrojejunostomy with gastrojejunal                            anastomosis characterized by single marginal ulcer                            (Biopsied), visible surgical staple (removed), and                            visible surgical suture.                           - Normal mucosa was found in the gastric fundus and  remnant body. Biopsied.                           - Normal examined jejunum. Biopsied. Recommendation:           - Patient has a contact number available for                            emergencies. The signs and symptoms of potential                            delayed complications were discussed with the                            patient. Return to normal activities tomorrow.                            Written discharge instructions were provided to the                            patient.                           - Resume previous diet.                           - Use Prevacid  (lansoprazole ) solutabs 30 mg PO BID                            for 8 weeks, then reduce to 30 mg daily.                           - Use sucralfate  suspension 1 gram PO QID for 8                            weeks.                           - Can stop omeprazole  (Protonix ) when starting the                            lansoprazole .                           - Await pathology results.                           - Repeat upper endoscopy in 6-8 weeks to check                            healing.                           - Return to GI office in 3 months or sooner as                            needed.                           -  Avoid NSAIDs.                           - Complete IV iron  as scheduled with follow-up in                            the Hematology Clinic afterwards with repeat CBC                            and iron  panel. Sandor Flatter, MD 12/13/2024 1:35:18 PM

## 2024-12-14 ENCOUNTER — Telehealth: Payer: Self-pay | Admitting: *Deleted

## 2024-12-14 NOTE — Telephone Encounter (Signed)
 Post procedure follow up call placed, no answer and VM full so unable to leave VM.

## 2024-12-15 ENCOUNTER — Ambulatory Visit

## 2024-12-15 VITALS — BP 151/89 | HR 69 | Temp 98.7°F | Resp 14 | Ht 62.0 in | Wt 137.0 lb

## 2024-12-15 DIAGNOSIS — D62 Acute posthemorrhagic anemia: Secondary | ICD-10-CM

## 2024-12-15 DIAGNOSIS — D500 Iron deficiency anemia secondary to blood loss (chronic): Secondary | ICD-10-CM

## 2024-12-15 DIAGNOSIS — D649 Anemia, unspecified: Secondary | ICD-10-CM

## 2024-12-15 MED ORDER — IRON SUCROSE 300 MG IVPB - SIMPLE MED
300.0000 mg | Freq: Once | Status: AC
  Administered 2024-12-15: 300 mg via INTRAVENOUS
  Filled 2024-12-15: qty 265

## 2024-12-15 NOTE — Progress Notes (Signed)
 Diagnosis:  Iron  Deficiency Anemia  Provider:  Praveen Mannam MD  Procedure: IV Infusion  IV Type: Peripheral, IV Location: R Antecubital   Venofer  (Iron  Sucrose), Dose: 300 mg  Infusion Start Time: 1313  Infusion Stop Time: 1447  Post Infusion IV Care: Patient declined observation and Peripheral IV Discontinued  Discharge: Condition: Good, Destination: Home . AVS Declined  Performed by:  Maximiano JONELLE Pouch, LPN

## 2024-12-16 LAB — SURGICAL PATHOLOGY

## 2024-12-21 ENCOUNTER — Ambulatory Visit: Payer: Self-pay | Admitting: Gastroenterology

## 2025-01-20 ENCOUNTER — Encounter

## 2025-01-21 ENCOUNTER — Ambulatory Visit (HOSPITAL_COMMUNITY): Admit: 2025-01-21 | Admitting: Obstetrics and Gynecology

## 2025-01-24 ENCOUNTER — Encounter: Payer: Self-pay | Admitting: Internal Medicine

## 2025-01-27 ENCOUNTER — Ambulatory Visit: Admitting: Internal Medicine

## 2025-01-28 ENCOUNTER — Encounter: Admitting: Gastroenterology
